# Patient Record
Sex: Female | Born: 1985 | Race: Black or African American | Hispanic: No | Marital: Single | Smoking: Current every day smoker
Health system: Southern US, Community
[De-identification: ages and names within clinical notes are randomized; demographics above are authoritative.]

## PROBLEM LIST (undated history)

## (undated) DIAGNOSIS — F319 Bipolar disorder, unspecified: Secondary | ICD-10-CM

## (undated) DIAGNOSIS — E119 Type 2 diabetes mellitus without complications: Secondary | ICD-10-CM

## (undated) HISTORY — DX: Type 2 diabetes mellitus without complications: E11.9

---

## 2005-07-03 ENCOUNTER — Emergency Department: Payer: Self-pay | Admitting: Emergency Medicine

## 2008-01-19 ENCOUNTER — Emergency Department: Payer: Self-pay | Admitting: Emergency Medicine

## 2008-01-20 ENCOUNTER — Ambulatory Visit: Payer: Self-pay | Admitting: Emergency Medicine

## 2015-09-05 ENCOUNTER — Other Ambulatory Visit: Payer: Self-pay | Admitting: Internal Medicine

## 2015-09-05 DIAGNOSIS — N63 Unspecified lump in unspecified breast: Secondary | ICD-10-CM

## 2015-09-15 ENCOUNTER — Other Ambulatory Visit: Payer: Self-pay | Admitting: Internal Medicine

## 2015-09-15 DIAGNOSIS — N6325 Unspecified lump in the left breast, overlapping quadrants: Secondary | ICD-10-CM

## 2015-09-15 DIAGNOSIS — N6320 Unspecified lump in the left breast, unspecified quadrant: Principal | ICD-10-CM

## 2015-09-21 ENCOUNTER — Other Ambulatory Visit: Payer: Self-pay

## 2015-10-05 ENCOUNTER — Other Ambulatory Visit: Payer: Self-pay

## 2015-10-07 ENCOUNTER — Telehealth: Payer: Self-pay

## 2015-10-07 NOTE — Telephone Encounter (Signed)
CALLED PATIENT TO MAKE APPT - REFERRED FROM [facility]

## 2015-10-11 ENCOUNTER — Telehealth: Payer: Self-pay

## 2015-10-11 NOTE — Telephone Encounter (Signed)
tried to call pateint to sch new patient appt, left msg

## 2015-10-17 ENCOUNTER — Other Ambulatory Visit: Payer: Self-pay

## 2015-10-24 ENCOUNTER — Other Ambulatory Visit: Payer: Self-pay | Admitting: Internal Medicine

## 2015-10-24 ENCOUNTER — Ambulatory Visit
Admission: RE | Admit: 2015-10-24 | Discharge: 2015-10-24 | Disposition: A | Discharge status: Home / Self Care | Payer: BLUE CROSS/BLUE SHIELD | Source: Ambulatory Visit | Attending: Internal Medicine | Admitting: Internal Medicine

## 2015-10-24 DIAGNOSIS — N6320 Unspecified lump in the left breast, unspecified quadrant: Principal | ICD-10-CM

## 2015-10-24 DIAGNOSIS — N6325 Unspecified lump in the left breast, overlapping quadrants: Secondary | ICD-10-CM

## 2015-10-27 ENCOUNTER — Other Ambulatory Visit: Payer: Self-pay | Admitting: Internal Medicine

## 2015-10-27 DIAGNOSIS — N6320 Unspecified lump in the left breast, unspecified quadrant: Principal | ICD-10-CM

## 2015-10-27 DIAGNOSIS — N6325 Unspecified lump in the left breast, overlapping quadrants: Secondary | ICD-10-CM

## 2015-10-28 ENCOUNTER — Other Ambulatory Visit: Payer: Self-pay | Admitting: Internal Medicine

## 2015-10-28 ENCOUNTER — Ambulatory Visit
Admission: RE | Admit: 2015-10-28 | Discharge: 2015-10-28 | Disposition: A | Discharge status: Home / Self Care | Payer: BLUE CROSS/BLUE SHIELD | Source: Ambulatory Visit | Attending: Internal Medicine | Admitting: Internal Medicine

## 2015-10-28 DIAGNOSIS — N6320 Unspecified lump in the left breast, unspecified quadrant: Principal | ICD-10-CM

## 2015-10-28 DIAGNOSIS — N6325 Unspecified lump in the left breast, overlapping quadrants: Secondary | ICD-10-CM

## 2015-11-10 ENCOUNTER — Ambulatory Visit: Payer: Self-pay | Admitting: Certified Nurse Midwife

## 2018-02-11 ENCOUNTER — Emergency Department: Payer: No Typology Code available for payment source

## 2018-02-11 ENCOUNTER — Emergency Department
Admission: EM | Admit: 2018-02-11 | Discharge: 2018-02-11 | Disposition: A | Discharge status: Other Acute Inpatient Hospital | Payer: No Typology Code available for payment source | Attending: Emergency Medicine | Admitting: Emergency Medicine

## 2018-02-11 ENCOUNTER — Encounter: Payer: Self-pay | Admitting: Emergency Medicine

## 2018-02-11 ENCOUNTER — Other Ambulatory Visit: Payer: Self-pay

## 2018-02-11 DIAGNOSIS — F312 Bipolar disorder, current episode manic severe with psychotic features: Secondary | ICD-10-CM | POA: Insufficient documentation

## 2018-02-11 DIAGNOSIS — F250 Schizoaffective disorder, bipolar type: Secondary | ICD-10-CM

## 2018-02-11 DIAGNOSIS — F17200 Nicotine dependence, unspecified, uncomplicated: Secondary | ICD-10-CM | POA: Insufficient documentation

## 2018-02-11 DIAGNOSIS — F29 Unspecified psychosis not due to a substance or known physiological condition: Secondary | ICD-10-CM | POA: Insufficient documentation

## 2018-02-11 DIAGNOSIS — R456 Violent behavior: Secondary | ICD-10-CM | POA: Insufficient documentation

## 2018-02-11 DIAGNOSIS — R451 Restlessness and agitation: Secondary | ICD-10-CM | POA: Insufficient documentation

## 2018-02-11 LAB — URINALYSIS, COMPLETE (UACMP) WITH MICROSCOPIC
Bacteria, UA: NONE SEEN
Bilirubin Urine: NEGATIVE
Glucose, UA: NEGATIVE mg/dL
Ketones, ur: NEGATIVE mg/dL
LEUKOCYTES UA: NEGATIVE
Nitrite: NEGATIVE
PH: 6 (ref 5.0–8.0)
Protein, ur: 100 mg/dL — AB
RBC / HPF: >50 RBC/hpf — ABNORMAL HIGH (ref 0–5)
SPECIFIC GRAVITY, URINE: 1.027 (ref 1.005–1.030)
WBC, UA: >50 WBC/hpf — ABNORMAL HIGH (ref 0–5)

## 2018-02-11 LAB — COMPREHENSIVE METABOLIC PANEL
ALK PHOS: 56 U/L (ref 38–126)
ALT: 13 U/L (ref 0–44)
AST: 28 U/L (ref 15–41)
Albumin: 3.8 g/dL (ref 3.5–5.0)
Anion gap: 7 (ref 5–15)
BUN: 18 mg/dL (ref 6–20)
CALCIUM: 9.3 mg/dL (ref 8.9–10.3)
CHLORIDE: 108 mmol/L (ref 98–111)
CO2: 25 mmol/L (ref 22–32)
CREATININE: 0.64 mg/dL (ref 0.44–1.00)
GFR calc non Af Amer: >60 mL/min (ref >60)
GLUCOSE: 109 mg/dL — AB (ref 70–99)
Potassium: 3.5 mmol/L (ref 3.5–5.1)
SODIUM: 140 mmol/L (ref 135–145)
Total Bilirubin: 0.4 mg/dL (ref 0.3–1.2)
Total Protein: 7.9 g/dL (ref 6.5–8.1)

## 2018-02-11 LAB — VITAMIN B12: Vitamin B-12: 322 pg/mL (ref 180–914)

## 2018-02-11 LAB — CBC
HEMATOCRIT: 34.3 % — AB (ref 35.0–47.0)
Hemoglobin: 11.4 g/dL — ABNORMAL LOW (ref 12.0–16.0)
MCH: 27.3 pg (ref 26.0–34.0)
MCHC: 33.2 g/dL (ref 32.0–36.0)
MCV: 82.4 fL (ref 80.0–100.0)
PLATELETS: 349 10*3/uL (ref 150–440)
RBC: 4.16 MIL/uL (ref 3.80–5.20)
RDW: 13.5 % (ref 11.5–14.5)
WBC: 6.9 10*3/uL (ref 3.6–11.0)

## 2018-02-11 LAB — POCT PREGNANCY, URINE: PREG TEST UR: NEGATIVE

## 2018-02-11 LAB — DIFFERENTIAL
BASOS ABS: 0.1 10*3/uL (ref 0–0.1)
BASOS PCT: 1 %
EOS ABS: 0.1 10*3/uL (ref 0–0.7)
EOS PCT: 1 %
Lymphocytes Relative: 40 %
Lymphs Abs: 2.8 10*3/uL (ref 1.0–3.6)
MONOS PCT: 9 %
Monocytes Absolute: 0.6 10*3/uL (ref 0.2–0.9)
Neutro Abs: 3.4 10*3/uL (ref 1.4–6.5)
Neutrophils Relative %: 49 %

## 2018-02-11 LAB — URINE DRUG SCREEN, QUALITATIVE (ARMC ONLY)
AMPHETAMINES, UR SCREEN: NOT DETECTED
BENZODIAZEPINE, UR SCRN: NOT DETECTED
CANNABINOID 50 NG, UR ~~LOC~~: POSITIVE — AB
Cocaine Metabolite,Ur ~~LOC~~: NOT DETECTED
MDMA (Ecstasy)Ur Screen: NOT DETECTED
Methadone Scn, Ur: NOT DETECTED
OPIATE, UR SCREEN: NOT DETECTED
PHENCYCLIDINE (PCP) UR S: NOT DETECTED
Tricyclic, Ur Screen: NOT DETECTED

## 2018-02-11 LAB — TSH: TSH: 2.164 u[IU]/mL (ref 0.350–4.500)

## 2018-02-11 LAB — ETHANOL: Alcohol, Ethyl (B): <10 mg/dL (ref <10)

## 2018-02-11 MED ORDER — LORAZEPAM 2 MG/ML IJ SOLN
2.0000 mg | Freq: Four times a day (QID) | INTRAMUSCULAR | Status: DC | PRN
Start: 2018-02-11 — End: 2018-02-12

## 2018-02-11 MED ORDER — CEPHALEXIN 500 MG PO CAPS
500.0000 mg | ORAL_CAPSULE | Freq: Three times a day (TID) | ORAL | Status: DC
  Administered 2018-02-11 (×2): 500 mg via ORAL
  Filled 2018-02-11 (×4): qty 1

## 2018-02-11 MED ORDER — LORAZEPAM 2 MG PO TABS
2.0000 mg | ORAL_TABLET | Freq: Four times a day (QID) | ORAL | Status: DC | PRN
  Administered 2018-02-11: 2 mg via ORAL
  Filled 2018-02-11: qty 1

## 2018-02-11 NOTE — ED Notes (Addendum)
Spoke with Jomarie LongsJoseph in the lab concerning the add on labs   - he states that he has all the specimens he will need

## 2018-02-11 NOTE — ED Triage Notes (Signed)
[facility] [place] [facility] with [place]. IVC papers reports that she is talking to people that are not there. In triage she is doing the same. Papers also states that she is yelling at family members and is not taking her medication. Family has tried to get her help and she is refusing. They are concerned for her safety. Denies SI/HI at this time.

## 2018-02-11 NOTE — Consult Note (Signed)
[facility] Consult   Reason for Consult: Consult for 32 year old woman brought to the hospital under paperwork filed by her family Referring Physician: Rip Harbour Patient Identification: Jenna Bell MRN:  [id number] Principal Diagnosis: Bipolar affective disorder, current episode manic with psychotic symptoms ([place]) Diagnosis:   Patient Active Problem List   Diagnosis Date Noted  . Bipolar affective disorder, current episode manic with psychotic symptoms ([place]) [F31.2] 02/11/2018    Total Time spent with patient: 1 hour  Subjective:   Jenna Bell is a 32 y.o. female patient admitted with "I am pregnant!  This ditzy mother!".  HPI: Patient seen chart reviewed.  Commitment paperwork details that the patient has been increasingly bizarre and agitated in her behavior recently.  It is reported that she talks to people who are not there including standing out in front of the house yelling at people who were not there.  Frightening family at night.  Not making sense.  Not eating and not taking care of her health.  Patient is very difficult to interview.  Throughout the interview she frequently shouts "I am pregnant!".  She answers questions but all very superficially usually with just one word.  She denies feeling she has any symptoms.  Claims she sleeps well.  Denies hallucinations.  Blames her whole situation on her mother and uses a lot of angry language towards her family.  Denies substance abuse.  Patient says she lives over in the [place] area and was just briefly here in town visiting her family.  Denies any psychiatric treatment.  Medical history: Paperwork reports that the patient has been losing a great deal of weight and her hair has been falling out.  No known medical problems  Social history: Patient says that she lives in [place] by herself.  She claims that she works regularly.  Petition filed by family.  Other details not available.  Substance abuse history:  Patient denies any.  Urine drug screen is not back yet  Past Psychiatric History: Patient denies ever seeing a psychiatrist or mental health provider in the past.  There is no record anywhere in the computer of previous psychiatric evaluation or concern.  The commitment paper claims that the patient has sought treatment in the past but never followed up with it.  Evidently no history of suicide attempts or hospitalization or psychiatric medicine  Risk to Self:   Risk to Others:   Prior Inpatient Therapy:   Prior Outpatient Therapy:    Past Medical History: History reviewed. No pertinent past medical history. History reviewed. No pertinent surgical history. Family History: History reviewed. No pertinent family history. Family Psychiatric  History: Unknown Social History:  Social History   Substance and Sexual Activity  Alcohol Use Never  . Frequency: Never     Social History   Substance and Sexual Activity  Drug Use Not on file    Social History   Socioeconomic History  . Marital status: Single    Spouse name: Not on file  . Number of children: Not on file  . Years of education: Not on file  . Highest education level: Not on file  Occupational History  . Not on file  Social Needs  . Financial resource strain: Not on file  . Food insecurity:    Worry: Not on file    Inability: Not on file  . Transportation needs:    Medical: Not on file    Non-medical: Not on file  Tobacco Use  . Smoking status:  Light Tobacco Smoker  Substance and Sexual Activity  . Alcohol use: Never    Frequency: Never  . Drug use: Not on file  . Sexual activity: Yes  Lifestyle  . Physical activity:    Days per week: Not on file    Minutes per session: Not on file  . Stress: Not on file  Relationships  . Social connections:    Talks on phone: Not on file    Gets together: Not on file    Attends religious service: Not on file    Active member of club or organization: Not on file    Attends  meetings of clubs or organizations: Not on file    Relationship status: Not on file  Other Topics Concern  . Not on file  Social History Narrative  . Not on file   Additional Social History:    Allergies:  No Known Allergies  Labs:  Results for orders placed or performed during the hospital encounter of 02/11/18 (from the past 48 hour(s))  Comprehensive metabolic panel     Status: Abnormal   Collection Time: 02/11/18  1:16 PM  Result Value Ref Range   Sodium 140 135 - 145 mmol/L   Potassium 3.5 3.5 - 5.1 mmol/L   Chloride 108 98 - 111 mmol/L    Comment: Please note change in reference range.   CO2 25 22 - 32 mmol/L   Glucose, Bld 109 (H) 70 - 99 mg/dL    Comment: Please note change in reference range.   BUN 18 6 - 20 mg/dL    Comment: Please note change in reference range.   Creatinine, Ser 0.64 0.44 - 1.00 mg/dL   Calcium 9.3 8.9 - 10.3 mg/dL   Total Protein 7.9 6.5 - 8.1 g/dL   Albumin 3.8 3.5 - 5.0 g/dL   AST 28 15 - 41 U/L   ALT 13 0 - 44 U/L    Comment: Please note change in reference range.   Alkaline Phosphatase 56 38 - 126 U/L   Total Bilirubin 0.4 0.3 - 1.2 mg/dL   GFR calc non Af Amer >60 >60 mL/min   GFR calc Af Amer >60 >60 mL/min    Comment: (NOTE) The eGFR has been calculated using the CKD EPI equation. This calculation has not been validated in all clinical situations. eGFR's persistently <60 mL/min signify possible Chronic Kidney Disease.    Anion gap 7 5 - 15    Comment: Performed at [facility], [address] [place], [place] [address]  Ethanol     Status: None   Collection Time: 02/11/18  1:16 PM  Result Value Ref Range   Alcohol, Ethyl (B) <10 <10 mg/dL    Comment: (NOTE) Lowest detectable limit for serum alcohol is 10 mg/dL. For medical purposes only. Performed at [facility], [address], [place] [address]   cbc     Status: Abnormal   Collection Time: 02/11/18  1:16 PM  Result Value Ref Range   WBC 6.9  3.6 - 11.0 K/uL   RBC 4.16 3.80 - 5.20 MIL/uL   Hemoglobin 11.4 (L) 12.0 - 16.0 g/dL   HCT 34.3 (L) 35.0 - 47.0 %   MCV 82.4 80.0 - 100.0 fL   MCH 27.3 26.0 - 34.0 pg   MCHC 33.2 32.0 - 36.0 g/dL   RDW 13.5 11.5 - 14.5 %   Platelets 349 150 - 440 K/uL    Comment: Performed at [facility],  [place], [place] [address]  TSH     Status: None   Collection Time: 02/11/18  1:16 PM  Result Value Ref Range   TSH 2.164 0.350 - 4.500 uIU/mL    Comment: Performed by a 3rd Generation assay with a functional sensitivity of <=0.01 uIU/mL. Performed at [facility], [address] [place], [place] [address]   Differential     Status: None   Collection Time: 02/11/18  1:16 PM  Result Value Ref Range   Neutrophils Relative % 49 %   Neutro Abs 3.4 1.4 - 6.5 K/uL   Lymphocytes Relative 40 %   Lymphs Abs 2.8 1.0 - 3.6 K/uL   Monocytes Relative 9 %   Monocytes Absolute 0.6 0.2 - 0.9 K/uL   Eosinophils Relative 1 %   Eosinophils Absolute 0.1 0 - 0.7 K/uL   Basophils Relative 1 %   Basophils Absolute 0.1 0 - 0.1 K/uL    Comment: Performed at [facility], [address] [place], [place] [address]  Urinalysis, Complete w Microscopic     Status: Abnormal   Collection Time: 02/11/18  1:17 PM  Result Value Ref Range   Color, Urine RED (A) YELLOW    Comment: BIOCHEMICALS MAY BE AFFECTED BY COLOR   APPearance CLOUDY (A) CLEAR   Specific Gravity, Urine 1.027 1.005 - 1.030   pH 6.0 5.0 - 8.0   Glucose, UA NEGATIVE NEGATIVE mg/dL   Hgb urine dipstick LARGE (A) NEGATIVE   Bilirubin Urine NEGATIVE NEGATIVE   Ketones, ur NEGATIVE NEGATIVE mg/dL   Protein, ur 100 (A) NEGATIVE mg/dL   Nitrite NEGATIVE NEGATIVE   Leukocytes, UA NEGATIVE NEGATIVE   RBC / HPF >50 (H) 0 - 5 RBC/hpf   WBC, UA >50 (H) 0 - 5 WBC/hpf   Bacteria, UA NONE SEEN NONE SEEN   Squamous Epithelial / LPF >50 (H) 0 - 5   Mucus PRESENT     Comment: Performed at [facility], [address] [place], [place] [address]  Pregnancy, urine POC     Status: None   Collection Time: 02/11/18  2:13 PM  Result Value Ref Range   Preg Test, Ur NEGATIVE NEGATIVE    Comment:        THE SENSITIVITY OF THIS METHODOLOGY IS >24 mIU/mL     Current Facility-Administered Medications  Medication Dose Route Frequency Provider Last Rate Last Dose  . cephALEXin (KEFLEX) capsule 500 mg  500 mg Oral Q8H Nena Polio, MD   500 mg at 02/11/18 1526   No current outpatient medications on file.    Musculoskeletal: Strength & Muscle Tone: within normal limits Gait & Station: normal Patient leans: N/A  Psychiatric Specialty Exam: Physical Exam  Nursing note and vitals reviewed. Constitutional: She appears well-developed and well-nourished.  HENT:  Head: Normocephalic and atraumatic.  Eyes: Pupils are equal, round, and reactive to light. Conjunctivae are normal.  Neck: Normal range of motion.  Cardiovascular: Regular rhythm and normal heart sounds.  Respiratory: Effort normal. No respiratory distress.  GI: Soft.  Musculoskeletal: Normal range of motion.  Neurological: She is alert.  Skin: Skin is warm and dry.  Psychiatric: Her affect is labile and inappropriate. Her speech is rapid and/or pressured and tangential. She is agitated and hyperactive. She is not aggressive. Thought content is paranoid and delusional. Cognition and memory are impaired. She expresses impulsivity and inappropriate judgment. She expresses no homicidal and no suicidal ideation.    Review  of Systems  Constitutional: Negative.   HENT: Negative.   Eyes: Negative.   Respiratory: Negative.   Cardiovascular: Negative.   Gastrointestinal: Negative.   Musculoskeletal: Negative.   Skin: Negative.   Neurological: Negative.   Psychiatric/Behavioral: Negative.     Blood pressure 134/90, pulse (!) 105, temperature 97.8 F (36.6 C), temperature source Oral, height '5\' 2"'  (1.575 m), weight 54.4 kg (120 lb), last  menstrual period 02/10/2018, SpO2 99 %.Body mass index is 21.95 kg/m.  General Appearance: Disheveled  Eye Contact:  Minimal  Speech:  Garbled and Pressured  Volume:  Increased  Mood:  Irritable  Affect:  Inappropriate and Labile  Thought Process:  Disorganized  Orientation:  NA  Thought Content:  Illogical, Paranoid Ideation and Tangential  Suicidal Thoughts:  No  Homicidal Thoughts:  No  Memory:  Immediate;   Fair Recent;   Poor Remote;   Poor  Judgement:  Impaired  Insight:  Lacking  Psychomotor Activity:  Increased  Concentration:  Concentration: Poor  Recall:  Poor  Fund of Knowledge:  Fair  Language:  Fair  Akathisia:  No  Handed:  Right  AIMS (if indicated):     Assets:  Housing Social Support  ADL's:  Impaired  Cognition:  Impaired,  Mild  Sleep:        Treatment Plan Summary: Daily contact with patient to assess and evaluate symptoms and progress in treatment, Medication management and Plan 32 year old woman who presents in under involuntary commitment papers that describe a lot of bizarre agitated behavior.  Patient is disheveled and sort of wild looking.  Her behavior in the interview is inappropriate and bizarre.  Very labile affect.  Very and appropriate answers to questions.  Patient when she is not shouting at me is talking to herself nonstop.  Frequently turns away and then whispers very rapidly.  When I inquired who she was talking to she got very defensive and denied that she was talking to anyone in particular but went straight back to it.  Differential diagnosis here would include substance-induced psychosis and new onset psychotic disorder such as bipolar manic disorder or schizophrenia.  The rest of the medical workup so far is fairly unremarkable.  Head CT is normal.  Her urine analysis as a significant amount of blood in it as well as white cells.  Not clear if it is infected or not.  Patient will remain under commitment and we will work on admission to the  psychiatric ward.  Disposition: Recommend psychiatric Inpatient admission when medically cleared. Supportive therapy provided about ongoing stressors.  Alethia Berthold, MD 02/11/2018 4:08 PM

## 2018-02-11 NOTE — BH Assessment (Signed)
Patient is to be admitted to [facility] BMU by Dr. Toni Amendlapacs.  Attending Physician will be Dr. Karie SodaPucilowksa.   Patient has been [facility] to room 313[facility] Charge Nurse Lillette BoxerGwen F.   ER staff is aware of the admission:  Glenda, ER Sectary   Dr. Alphonzo LemmingsMcShane, ER MD   Vonna KotykJay, Patient Access.

## 2018-02-11 NOTE — ED Notes (Addendum)
Patient assigned to appropriate care area   Introduced self to pt  Patient oriented to unit/care area: Informed that, for their safety, care areas are designed for safety and visiting and phone hours explained to patient. Patient verbalizes understanding, and verbal contract for safety obtained  Environment secured   Patient is on the unit and appears to responding to internal stimuli.  Patient is talking to people that are not there. Denies SI/HI at this time. Patient is stating I want to go home, she spoke with her mother. Patient said she will take the Ativan 2mg .

## 2018-02-11 NOTE — ED Notes (Signed)

## 2018-02-11 NOTE — ED Notes (Signed)
Lunch provided   BEHAVIORAL HEALTH ROUNDING Patient sleeping: No. Patient alert and oriented: yes Behavior appropriate: Yes.  ; If no, describe:  Nutrition and fluids offered: yes Toileting and hygiene offered: Yes  Sitter present: q15 minute observations and security monitoring Law enforcement present: Yes

## 2018-02-11 NOTE — ED Provider Notes (Signed)
[facility] Emergency Department Provider Note   ____________________________________________   First MD Initiated Contact with Patient 02/11/18 1334     (approximate)  I have reviewed the triage vital signs and the nursing notes.   HISTORY  Chief Complaint Psychiatric Evaluation    HPI Jenna Bell is a 32 y.o. female who comes in under commitment.  Family reports she has no known mental illness she is not on any medication for mental illness but she has been having some problems.  Family reports she is attempted to get her help but she always refuses.  She is reported to be verbally and physically aggressive and yelled at people who were not there carries on conversations with people who were not there she is not eating family reports her hair is falling out.  She is acting strangely staring at people and then walking away in the emergency room patient says she is feeling okay nothing is bothering her but she begins talking to herself while I am listening to her heart and lungs seems as though she is talking to someone who is not there.  Patient denies any fever chills nausea vomiting diarrhea chest pain belly pain or any other problems.   History reviewed. No pertinent past medical history.  There are no active problems to display for this patient.   History reviewed. No pertinent surgical history.  Prior to Admission medications   Not on File    Allergies Patient has no known allergies.  History reviewed. No pertinent family history.  Social History Social History   Tobacco Use  . Smoking status: Light Tobacco Smoker  Substance Use Topics  . Alcohol use: Never    Frequency: Never  . Drug use: Not on file    Review of Systems  Constitutional: No fever/chills Eyes: No visual changes. ENT: No sore throat. Cardiovascular: Denies chest pain. Respiratory: Denies shortness of breath. Gastrointestinal: No abdominal pain.  No nausea, no  vomiting.  No diarrhea.  No constipation. Genitourinary: Negative for dysuria. Musculoskeletal: Negative for back pain. Skin: Negative for rash. Neurological: Negative for headaches, focal weakness   ____________________________________________   PHYSICAL EXAM:  VITAL SIGNS: ED Triage Vitals  Enc Vitals Group     BP 02/11/18 1312 134/90     Pulse Rate 02/11/18 1312 (!) 105     Resp --      Temp 02/11/18 1312 97.8 F (36.6 C)     Temp Source 02/11/18 1312 Oral     SpO2 02/11/18 1312 99 %     Weight 02/11/18 1313 120 lb (54.4 kg)     Height 02/11/18 1313 5\' 2"  (1.575 m)     Head Circumference --      Peak Flow --      Pain Score 02/11/18 1313 0     Pain Loc --      Pain Edu? --      Excl. in GC? --     Constitutional: Alert and oriented. Well appearing and in no acute distress but acting unusually as I did detailed above. Eyes: Conjunctivae are normal. . Head: Atraumatic. Nose: No congestion/rhinnorhea. Mouth/Throat: Mucous membranes are moist.  Oropharynx non-erythematous. Neck: No stridor.   Hematological/Lymphatic/Immunilogical: No cervical lymphadenopathy. Cardiovascular: Normal rate, regular rhythm. Grossly normal heart sounds.  Good peripheral circulation. Respiratory: Normal respiratory effort.  No retractions. Lungs CTAB. Gastrointestinal: Soft and nontender. No distention. No abdominal bruits. No CVA tenderness. }Musculoskeletal: No lower extremity tenderness nor edema.  No joint  effusions. Neurologic:  Normal speech and language. No gross focal neurologic deficits are appreciated. Skin:  Skin is warm, dry and intact. No rash noted.   ____________________________________________   LABS (all labs ordered are listed, but only abnormal results are displayed)  Labs Reviewed  COMPREHENSIVE METABOLIC PANEL - Abnormal; Notable for the following components:      Result Value   Glucose, Bld 109 (*)    All other components within normal limits  CBC - Abnormal;  Notable for the following components:   Hemoglobin 11.4 (*)    HCT 34.3 (*)    All other components within normal limits  URINALYSIS, COMPLETE (UACMP) WITH MICROSCOPIC - Abnormal; Notable for the following components:   Color, Urine RED (*)    APPearance CLOUDY (*)    Hgb urine dipstick LARGE (*)    Protein, ur 100 (*)    RBC / HPF >50 (*)    WBC, UA >50 (*)    Squamous Epithelial / LPF >50 (*)    All other components within normal limits  URINE CULTURE  ETHANOL  TSH  DIFFERENTIAL  URINE DRUG SCREEN, QUALITATIVE ([facility] ONLY)  VITAMIN B12  RPR  FOLATE RBC  SALICYLATE LEVEL  ACETAMINOPHEN LEVEL  POC URINE PREG, ED  POCT PREGNANCY, URINE   ____________________________________________  EKG   ____________________________________________  RADIOLOGY  ED MD interpretation: Chest x-ray and head CT read by radiology are negative.  I reviewed the chest x-ray.  Official radiology report(s): Ct Head Wo Contrast  Result Date: 02/11/2018 CLINICAL DATA:  Altered mental status. EXAM: CT HEAD WITHOUT CONTRAST TECHNIQUE: Contiguous axial images were obtained from the base of the skull through the vertex without intravenous contrast. COMPARISON:  None. FINDINGS: Brain: No evidence of acute infarction, hemorrhage, hydrocephalus, extra-axial collection or mass lesion/mass effect. Vascular: No hyperdense vessel or unexpected calcification. Skull: Intact.  No focal lesion. Sinuses/Orbits: Negative. Other: None. IMPRESSION: Normal head CT. Electronically Signed   By: Drusilla Kannerhomas  Dalessio M.D.   On: 02/11/2018 14:30   Dg Chest Portable 1 View  Result Date: 02/11/2018 CLINICAL DATA:  Altered mental status EXAM: PORTABLE CHEST 1 VIEW COMPARISON:  None. FINDINGS: The heart size and mediastinal contours are within normal limits. Both lungs are clear. The visualized skeletal structures are unremarkable. IMPRESSION: No active disease. Electronically Signed   By: Tollie Ethavid  Kwon M.D.   On: 02/11/2018 13:50     ____________________________________________   PROCEDURES  Procedure(s) performed:   Procedures  Critical Care performed:   ____________________________________________   INITIAL IMPRESSION / ASSESSMENT AND PLAN / ED COURSE  Patient denies any abdominal pain.  She does says she is on her menstrual period.  That does not explain the white blood cells in her urine.  I will give her some antibiotics for that otherwise she is essentially medically cleared we will get the RBC folate etc. back shortly.     ____________________________________________   FINAL CLINICAL IMPRESSION(S) / ED DIAGNOSES  Final diagnoses:  Psychosis, unspecified psychosis type [facility](HCC)     ED Discharge Orders    None       Note:  This document was prepared using Dragon voice recognition software and may include unintentional dictation errors.    Arnaldo NatalMalinda, Milferd Ansell F, MD 02/11/18 [id number]

## 2018-02-11 NOTE — ED Notes (Signed)
Pt to CT scan with BPD and Gerilyn PilgrimJacob

## 2018-02-11 NOTE — BH Assessment (Signed)
Assessment Note  Jenna Bell is an 32 y.o. female who presents to the ER due to family having concerns about the recent change in her behaviors. Per report giving to ER staff, the patient is having behaviors of someone who is manic. Decrease of sleep, erratic and aggressive behaviors.   Patient was unable to participate in interview due to being agitated. She continued to state their wasn't anything wrong with her but she was only pregnant, which she isn't. Prior to Clinical research associatewriter interacting with the patient, he witnessed her responding to internal stimuli. When patient saw Clinical research associatewriter, she stop talking and requested to go home because "nothing ain't wrong with me." Patient denies SI/HI and AV/H. Per records and report of patient's family, she there have been no past mental health concerns.   Patient UDS indicate she uses cannabis. Per [place] [address] [facility] website, the patient do not have any upcoming courts dates. It's unclear what her past involvement with legal system have been.  Diagnosis: Bipolar  Past Medical History: History reviewed. No pertinent past medical history.  History reviewed. No pertinent surgical history.  Family History: History reviewed. No pertinent family history.  Social History:  reports that she has been smoking.  She does not have any smokeless tobacco history on file. She reports that she does not drink alcohol. Her drug history is not on file.  Additional Social History:  Alcohol / Drug Use Pain Medications: See PTA Prescriptions: See PTA Over the Counter: See PTA History of alcohol / drug use?: Yes Longest period of sobriety (when/how long): Unable to assess Negative Consequences of Use: (Unable to assess) Substance #1 Name of Substance 1: Cannabis  CIWA: CIWA-Ar BP: 134/90 Pulse Rate: (!) 105 COWS:    Allergies: No Known Allergies  Home Medications:  (Not in a hospital admission)  OB/GYN Status:  Patient's last menstrual period was 02/10/2018.  General  Assessment Data [facility] Assessment: In system Is this a Tele or Face-to-Face Assessment?: Face-to-Face Is this an Initial Assessment or a Re-assessment for this encounter?: Initial Assessment Marital status: Single Maiden name: n/a Is patient pregnant?: No Pregnancy Status: No Living Arrangements: Other (Comment) Can pt return to current living arrangement?: Yes Admission Status: Involuntary Is patient capable of signing voluntary admission?: No(Under IVC) Referral Source: Self/Family/Friend Insurance type: None  Medical Screening Exam [facility] Walk-in ONLY) Medical Exam completed: Yes  Crisis Care Plan Living Arrangements: Other (Comment) Legal Guardian: Other:(Self) Name of Psychiatrist: Reports of none Name of Therapist: Reports of none  Education Status Is patient currently in school?: No  Risk to self with the past 6 months Suicidal Ideation: No Has patient been a risk to self within the past 6 months prior to admission? : No Suicidal Intent: No Has patient had any suicidal intent within the past 6 months prior to admission? : No Is patient at risk for suicide?: No Suicidal Plan?: No Has patient had any suicidal plan within the past 6 months prior to admission? : No Access to Means: No What has been your use of drugs/alcohol within the last 12 months?: Cannabis Previous Attempts/Gestures: No How many times?: 0 Other Self Harm Risks: Reports of none Triggers for Past Attempts: None known Intentional Self Injurious Behavior: None Family Suicide History: Unknown Recent stressful life event(s): Other (Comment)(Recent onset of mania) Persecutory voices/beliefs?: No Depression: No Depression Symptoms: Feeling angry/irritable Substance abuse history and/or treatment for substance abuse?: Yes Suicide prevention information given to non-admitted patients: Not applicable  Risk  to Others within the past 6 months Homicidal Ideation: No Does  patient have any lifetime risk of violence toward others beyond the six months prior to admission? : Yes (comment) Thoughts of Harm to Others: No Current Homicidal Intent: No Current Homicidal Plan: No Access to Homicidal Means: No Identified Victim: Reports of none History of harm to others?: Yes Assessment of Violence: In past 6-12 months(Per IVC and report giving by family) Violent Behavior Description: Family reports patient violent towards them. Does patient have access to weapons?: No Criminal Charges Pending?: No Does patient have a court date: No Is patient on probation?: No  Psychosis Hallucinations: Auditory Delusions: Unspecified  Mental Status Report Appearance/Hygiene: Poor hygiene, In scrubs Eye Contact: Fair Motor Activity: Freedom of movement, Unremarkable Speech: Argumentative, Loud Level of Consciousness: Alert, Restless Mood: Anxious, Suspicious, Irritable Affect: Angry, Anxious, Sad Anxiety Level: Minimal Thought Processes: Coherent, Irrelevant Judgement: Partial Orientation: Person, Place, Time, Situation, Appropriate for developmental age Obsessive Compulsive Thoughts/Behaviors: Moderate  Cognitive Functioning Concentration: Decreased Memory: Recent Intact, Remote Intact Is patient IDD: No Is patient DD?: No Insight: Poor Impulse Control: Poor Appetite: Fair(Unable to assess) Have you had any weight changes? : (Unable to assess) Sleep: Unable to Assess Vegetative Symptoms: None  ADLScreening [facility] Assessment Services) Patient's cognitive ability adequate to safely complete daily activities?: Yes Patient able to express need for assistance with ADLs?: Yes Independently performs ADLs?: Yes (appropriate for developmental age)  Prior Inpatient Therapy Prior Inpatient Therapy: No  Prior Outpatient Therapy Prior Outpatient Therapy: No Does patient have an ACCT team?: No Does patient have Intensive In-House Services?  : No Does patient have  [facility] services? : No Does patient have P4CC services?: No  ADL Screening (condition at time of admission) Patient's cognitive ability adequate to safely complete daily activities?: Yes Is the patient deaf or have difficulty hearing?: No Does the patient have difficulty seeing, even when wearing glasses/contacts?: No Does the patient have difficulty concentrating, remembering, or making decisions?: No Patient able to express need for assistance with ADLs?: Yes Does the patient have difficulty dressing or bathing?: No Independently performs ADLs?: Yes (appropriate for developmental age) Does the patient have difficulty walking or climbing stairs?: No Weakness of Legs: None Weakness of Arms/Hands: None  Home Assistive Devices/Equipment Home Assistive Devices/Equipment: None  Therapy Consults (therapy consults require a physician order) PT Evaluation Needed: No OT Evalulation Needed: No SLP Evaluation Needed: No Abuse/Neglect Assessment (Assessment to be complete while patient is alone) Abuse/Neglect Assessment Can Be Completed: Unable to assess, patient is non-responsive or altered mental status Values / Beliefs Cultural Requests During Hospitalization: None Spiritual Requests During Hospitalization: None Consults Spiritual Care Consult Needed: No Social Work Consult Needed: No         Child/Adolescent Assessment Running Away Risk: Denies(Patient is an adult)  Disposition:  Disposition Initial Assessment Completed for this Encounter: Yes  On Site Evaluation by:   Reviewed with Physician:    Lilyan Gilford MS, LCAS, LPC, NCC, CCSI Therapeutic Triage Specialist 02/11/2018 6:28 PM

## 2018-02-11 NOTE — ED Notes (Signed)

## 2018-02-11 NOTE — ED Notes (Signed)
Patient received PM snack. 

## 2018-02-11 NOTE — ED Notes (Signed)
BEHAVIORAL HEALTH ROUNDING Patient sleeping: No. Patient alert and oriented: yes Behavior appropriate: Yes.  ; If no, describe:  Nutrition and fluids offered: yes Toileting and hygiene offered: Yes  Sitter present: q15 minute observations and security monitoring Law enforcement present: Yes    

## 2018-02-11 NOTE — ED Notes (Signed)
IVC/ Consult completed/ Plan to admit to BMU  

## 2018-02-12 ENCOUNTER — Inpatient Hospital Stay
Admission: AD | Admit: 2018-02-12 | Discharge: 2018-03-07 | DRG: 885 | Disposition: A | Discharge status: Home / Self Care | Payer: No Typology Code available for payment source | Attending: Psychiatry | Admitting: Psychiatry

## 2018-02-12 ENCOUNTER — Encounter: Payer: Self-pay | Admitting: Psychiatry

## 2018-02-12 DIAGNOSIS — F17210 Nicotine dependence, cigarettes, uncomplicated: Secondary | ICD-10-CM | POA: Diagnosis present

## 2018-02-12 DIAGNOSIS — F70 Mild intellectual disabilities: Secondary | ICD-10-CM | POA: Diagnosis present

## 2018-02-12 DIAGNOSIS — F1220 Cannabis dependence, uncomplicated: Secondary | ICD-10-CM | POA: Diagnosis present

## 2018-02-12 DIAGNOSIS — N390 Urinary tract infection, site not specified: Secondary | ICD-10-CM | POA: Diagnosis present

## 2018-02-12 DIAGNOSIS — F250 Schizoaffective disorder, bipolar type: Secondary | ICD-10-CM | POA: Diagnosis present

## 2018-02-12 DIAGNOSIS — G4700 Insomnia, unspecified: Secondary | ICD-10-CM | POA: Diagnosis present

## 2018-02-12 DIAGNOSIS — F17200 Nicotine dependence, unspecified, uncomplicated: Secondary | ICD-10-CM | POA: Diagnosis present

## 2018-02-12 DIAGNOSIS — F89 Unspecified disorder of psychological development: Secondary | ICD-10-CM | POA: Diagnosis present

## 2018-02-12 DIAGNOSIS — F312 Bipolar disorder, current episode manic severe with psychotic features: Secondary | ICD-10-CM | POA: Diagnosis not present

## 2018-02-12 HISTORY — DX: Bipolar disorder, unspecified: F31.9

## 2018-02-12 LAB — LIPID PANEL
CHOL/HDL RATIO: 3.7 ratio
Cholesterol: 170 mg/dL (ref 0–200)
HDL: 46 mg/dL (ref >40)
LDL Cholesterol: 111 mg/dL — ABNORMAL HIGH (ref 0–99)
TRIGLYCERIDES: 63 mg/dL (ref <150)
VLDL: 13 mg/dL (ref 0–40)

## 2018-02-12 LAB — TSH: TSH: 2.983 u[IU]/mL (ref 0.350–4.500)

## 2018-02-12 LAB — RPR: RPR: NONREACTIVE

## 2018-02-12 LAB — ACETAMINOPHEN LEVEL: Acetaminophen (Tylenol), Serum: <10 ug/mL — ABNORMAL LOW (ref 10–30)

## 2018-02-12 LAB — SALICYLATE LEVEL

## 2018-02-12 MED ORDER — MAGNESIUM HYDROXIDE 400 MG/5ML PO SUSP: 30.0000 mL | Freq: Every day | ORAL | Status: DC | PRN

## 2018-02-12 MED ORDER — LORAZEPAM 2 MG PO TABS
2.0000 mg | ORAL_TABLET | ORAL | Status: DC | PRN
  Administered 2018-02-13 – 2018-02-19 (×4): 2 mg via ORAL
  Filled 2018-02-12 (×5): qty 1

## 2018-02-12 MED ORDER — DIPHENHYDRAMINE HCL 25 MG PO CAPS
50.0000 mg | ORAL_CAPSULE | Freq: Four times a day (QID) | ORAL | Status: DC | PRN
  Administered 2018-02-17 – 2018-02-19 (×2): 50 mg via ORAL
  Filled 2018-02-12 (×2): qty 2

## 2018-02-12 MED ORDER — HALOPERIDOL 5 MG PO TABS
10.0000 mg | ORAL_TABLET | Freq: Four times a day (QID) | ORAL | Status: DC | PRN
  Administered 2018-02-17 – 2018-02-19 (×2): 10 mg via ORAL
  Filled 2018-02-12 (×2): qty 2

## 2018-02-12 MED ORDER — ADULT MULTIVITAMIN W/MINERALS CH
1.0000 | ORAL_TABLET | Freq: Every day | ORAL | Status: DC
Start: 2018-02-12 — End: 2018-03-07
  Administered 2018-02-12 – 2018-03-07 (×24): 1 via ORAL
  Filled 2018-02-12 (×23): qty 1

## 2018-02-12 MED ORDER — CEPHALEXIN 500 MG PO CAPS
500.0000 mg | ORAL_CAPSULE | Freq: Three times a day (TID) | ORAL | Status: DC
  Administered 2018-02-12 – 2018-02-16 (×12): 500 mg via ORAL
  Filled 2018-02-12 (×12): qty 1

## 2018-02-12 MED ORDER — QUETIAPINE FUMARATE 200 MG PO TABS
300.0000 mg | ORAL_TABLET | Freq: Every day | ORAL | Status: DC
  Administered 2018-02-12: 300 mg via ORAL
  Filled 2018-02-12: qty 2

## 2018-02-12 MED ORDER — HALOPERIDOL LACTATE 5 MG/ML IJ SOLN
10.0000 mg | Freq: Four times a day (QID) | INTRAMUSCULAR | Status: DC | PRN
  Administered 2018-02-13: 10 mg via INTRAMUSCULAR
  Filled 2018-02-12: qty 2

## 2018-02-12 MED ORDER — ENSURE ENLIVE PO LIQD
237.0000 mL | Freq: Three times a day (TID) | ORAL | Status: DC
  Administered 2018-02-12 – 2018-03-03 (×50): 237 mL via ORAL

## 2018-02-12 MED ORDER — HYDROXYZINE HCL 50 MG PO TABS
50.0000 mg | ORAL_TABLET | Freq: Three times a day (TID) | ORAL | Status: DC | PRN
  Administered 2018-02-16: 50 mg via ORAL
  Filled 2018-02-12 (×2): qty 1

## 2018-02-12 MED ORDER — ALUM & MAG HYDROXIDE-SIMETH 200-200-20 MG/5ML PO SUSP: 30.0000 mL | ORAL | Status: DC | PRN

## 2018-02-12 MED ORDER — ACETAMINOPHEN 325 MG PO TABS: 650.0000 mg | ORAL_TABLET | Freq: Four times a day (QID) | ORAL | Status: DC | PRN

## 2018-02-12 MED ORDER — VITAMIN B-12 1000 MCG PO TABS
1000.0000 ug | ORAL_TABLET | Freq: Every day | ORAL | Status: DC
  Administered 2018-02-12 – 2018-03-07 (×24): 1000 ug via ORAL
  Filled 2018-02-12 (×24): qty 1

## 2018-02-12 MED ORDER — LORAZEPAM 2 MG/ML IJ SOLN
2.0000 mg | INTRAMUSCULAR | Status: DC | PRN
  Administered 2018-02-13: 2 mg via INTRAMUSCULAR
  Filled 2018-02-12: qty 1

## 2018-02-12 MED ORDER — DIPHENHYDRAMINE HCL 50 MG/ML IJ SOLN
50.0000 mg | Freq: Four times a day (QID) | INTRAMUSCULAR | Status: DC | PRN
  Administered 2018-02-13: 50 mg via INTRAMUSCULAR
  Filled 2018-02-12: qty 1

## 2018-02-12 MED ORDER — QUETIAPINE FUMARATE 100 MG PO TABS
100.0000 mg | ORAL_TABLET | Freq: Every day | ORAL | Status: DC
  Administered 2018-02-12: 100 mg via ORAL
  Filled 2018-02-12: qty 1

## 2018-02-12 NOTE — [facility] Suicide Risk Assessment (Addendum)
[facility] INPATIENT:  Family/Significant Other Suicide Prevention Education  Suicide Prevention Education:  Contact Attempts:Lisa Devin, patients mother, [phone] has been identified by the patient as the family member/significant other with whom the patient will be residing, and identified as the person(s) who will aid the patient in the event of a mental health crisis.  With written consent from the patient, two attempts were made to provide suicide prevention education, prior to and/or following the patient's discharge.  We were unsuccessful in providing suicide prevention education.  A suicide education pamphlet was given to the patient to share with family/significant other.  Date and time of first attempt:CSW attempted to contact the patients family member/ significant other to complete SPE and obtain collateral information. CSW was unable to leave a voicemail. 02/12/2018 at 1:49PM.   Date and time of second attempt:  Johny ShearsCassandra  Amado Andal 02/12/2018, 1:48 PM

## 2018-02-12 NOTE — BHH Counselor (Signed)
Adult Comprehensive Assessment  Patient ID: Jenna Bell, female   DOB: June 21, 1986, 32 y.o.   MRN: [id number]  Information Source: Information source: Patient  Current Stressors:  Patient states their primary concerns and needs for treatment are:: Pt,. reports that she doesnt know why she is here. She says she remembers being at her mothers house and then the cops coming to get her. Patient states their goals for this hospitilization and ongoing recovery are:: Pt. reports that she wants some understanding Educational / Learning stressors: None Employment / Job issues: Pt. reports that she has a job Family Relationships: Pt. has a chaotic family Animal nutritionistsituation Financial / Lack of resources (include bankruptcy): None Housing / Lack of housing: Pt. may be homeless. Reports that she has some friends that she [place] go and [place] Physical health (include injuries & life threatening diseases): Pt. reports that she is pregnant Social relationships: None reported Substance abuse: Pt. reports using THC on a daily basis. Bereavement / Loss: None  Living/Environment/Situation:  Living Arrangements: Parent Living conditions (as described by patient or guardian): Pt. reports that she came to [place] to stay with her mother, but her mother doesnt want her there. Who else lives in the home?: Her and her mother How long has patient lived in current situation?: Since Sunday What is atmosphere in current home: Temporary  Family History:  Marital status: Single What is your sexual orientation?: Straight Has your sexual activity been affected by drugs, alcohol, medication, or emotional stress?: None Does patient have children?: No  Childhood History:  By whom was/is the patient raised?: Mother Description of patient's relationship with caregiver when they were a child: Pt. reports that she doesnt have a good relationhsip with her mother "I took care of myself since [age]" Pt. reports her  father died. Patient's description of current relationship with people who raised him/her: Pt. reports that she doesnt mess with her mother How were you disciplined when you got in trouble as a child/adolescent?: No discipline Does patient have siblings?: No Did patient suffer any verbal/emotional/physical/sexual abuse as a child?: No Did patient suffer from severe childhood neglect?: No Has patient ever been sexually abused/assaulted/raped as an adolescent or adult?: No Was the patient ever a victim of a crime or a disaster?: No Witnessed domestic violence?: No Has patient been effected by domestic violence as an adult?: No  Education:  Highest grade of school patient has completed: 4 years of college Currently a student?: No Learning disability?: Yes What learning problems does patient have?: Pt. 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  Employment/Work Situation:   Employment situation: Employed Where is patient currently employed?: [employer] [place] How long has patient been employed?: 2 years Patient's job has been impacted by current illness: No What is the longest time patient has a held a job?: Pt. reports that she can not remember Did You Receive Any Psychiatric Treatment/Services While in the Military?: No Are There Guns or Other Weapons in Your Home?: No  Financial Resources:   Financial resources: Income from employment Does patient have a representative payee or guardian?: No  Alcohol/Substance Abuse:   What has been your use of drugs/alcohol within the last 12 months?: THC, daily, a couple of blunts a day since the age of 32 If attempted suicide, did drugs/alcohol play a role in this?: No Alcohol/Substance Abuse Treatment Hx: Denies past history Has alcohol/substance abuse ever caused legal problems?: No  Social Support System:   Forensic psychologistatient's Community Support  System: None Type of faith/religion: None How does patient's faith help to cope with  current illness?: N/A  Leisure/Recreation:   Leisure and Hobbies: Walking  Strengths/Needs:   What is the patient's perception of their strengths?: Good at following directions Patient states they can use these personal strengths during their treatment to contribute to their recovery: None Patient states these barriers may affect/interfere with their treatment: Pt. reports that there is nothing wrong with her Patient states these barriers may affect their return to the community: Pt. may be homeless but reports that she is going back to [place] to stay with her frineds. Other important information patient would like considered in planning for their treatment: Pt. reports that she is not interested in any substance abuse treatment.  Discharge Plan:   Currently receiving community mental health services: No Patient states concerns and preferences for aftercare planning are: Pt. reports that there is nothing wrong with her. Patient states they will know when they are safe and ready for discharge when: Pt. reports "I want to be discharged now." Does patient have access to transportation?: No Does patient have financial barriers related to discharge medications?: Yes Patient description of barriers related to discharge medications: No income and no insurcance Plan for no access to transportation at discharge: CSW will assess for transportation means at discharge Will patient be returning to same living situation after discharge?: Yes(Pt. reports that if her mother will allow her to, she will go back to [place] and if not she will go to a friends house in [place])  Summary/Recommendations:   Summary and Recommendations (to be completed by the evaluator): Patient is a 32 year old African American female admitted involuntarily by her mother due to bizarre behavior. The patient reports that she doesn't know why she is here and seems to be very irritable.  The patient reports that she came to  [place] on Sunday to live with her mother. There are reports that the patient was yelling and talking to people outside who were not real. The patient reports that her mother is just mad because she is pregnant and needs to mind her own business. The patient denies any stressors outside of family conflict. She reports using THC, several blunts, daily. Her UDS was positive for THC. His affect was inappropriate and labile. At discharge, patient wants to return [facility] or go back to [place]. While here, patient will benefit from crisis stabilization, medication evaluation, group therapy and psychoeducation, in addition to case management for discharge planning. At discharge, it is recommended that patient remain compliant with the established discharge plan and continue treatment.   [name]. 02/12/2018

## 2018-02-12 NOTE — [facility] Suicide Risk Assessment (Addendum)
[facility]   Nursing information obtained from:    Demographic factors:  Adolescent or young adult, Low socioeconomic status, Unemployed Current Mental Status:  NA Loss Factors:  Financial problems / change in socioeconomic status, Decrease in vocational status Historical Factors:  Impulsivity Risk Reduction Factors:  Living with another person, especially a relative  Total Time spent with patient: 1 hour Principal Problem: Bipolar I disorder, current or most recent episode manic, with psychotic features (HCC) Diagnosis:   Patient Active Problem List   Diagnosis Date Noted  . Bipolar I disorder, current or most recent episode manic, with psychotic features (HCC) [F31.2] 02/11/2018    Priority: High  . Tobacco use disorder [F17.200] 02/12/2018  . Cannabis use disorder, moderate, dependence (HCC) [F12.20] 02/12/2018  . UTI (urinary tract infection) [N39.0] 02/12/2018   Subjective Data: psychotic break.  Continued Clinical Symptoms:  Alcohol Use Disorder Identification Test Final Score (AUDIT): 3 The "Alcohol Use Disorders Identification Test", Guidelines for Use in Primary Care, Second Edition.  World Science writerHealth Organization [facility](WHO). Score between 0-7:  no or low risk or alcohol related problems. Score between 8-15:  moderate risk of alcohol related problems. Score between 16-19:  high risk of alcohol related problems. Score 20 or above:  warrants further diagnostic evaluation for alcohol dependence and treatment.   CLINICAL FACTORS:   Bipolar Disorder:   Mixed State Alcohol/Substance Abuse/Dependencies Currently Psychotic   Musculoskeletal: Strength & Muscle Tone: within normal limits Gait & Station: normal Patient leans: N/A  Psychiatric Specialty Exam: Physical Exam  Nursing note and vitals reviewed. Psychiatric: Her affect is angry and inappropriate. Her speech is rapid and/or pressured. She is hyperactive. Thought content is delusional. Cognition  and memory are impaired. She expresses impulsivity. She is noncommunicative. She is inattentive.    Review of Systems  Neurological: Negative.   Psychiatric/Behavioral: Positive for hallucinations and substance abuse. The patient has insomnia.   All other systems reviewed and are negative.   Blood pressure 113/68, pulse 70, temperature 98.7 F (37.1 C), temperature source Oral, resp. rate 18, height 5\' 5"  (1.651 m), weight 55.3 kg (122 lb), last menstrual period 02/10/2018, SpO2 100 %.Body mass index is 20.3 kg/m.  General Appearance: Disheveled  Eye Contact:  Minimal  Speech:  Garbled  Volume:  Increased  Mood:  Angry, Dysphoric and Irritable  Affect:  Congruent  Thought Process:  Disorganized, Irrelevant and Descriptions of Associations: Loose  Orientation:  Full (Time, Place, and Person)  Thought Content:  Delusions, Hallucinations: Auditory and Paranoid Ideation  Suicidal Thoughts:  No  Homicidal Thoughts:  No  Memory:  Immediate;   Poor Recent;   Poor Remote;   Poor  Judgement:  Poor  Insight:  Lacking  Psychomotor Activity:  Increased  Concentration:  Concentration: Poor and Attention Span: Poor  Recall:  Poor  Fund of Knowledge:  Poor  Language:  Poor  Akathisia:  No  Handed:  Right  AIMS (if indicated):     Assets:  Communication Skills Desire for Improvement Physical Health Resilience Social Support  ADL's:  Intact  Cognition:  WNL  Sleep:  Number of Hours: 5      COGNITIVE FEATURES THAT CONTRIBUTE TO RISK:  Closed-mindedness    SUICIDE RISK:   Moderate:  Frequent suicidal ideation with limited intensity, and duration, some specificity in terms of plans, no associated intent, good self-control, limited dysphoria/symptomatology, some risk factors present, and identifiable protective factors, including available and accessible social support.  PLAN OF CARE: hospital  admission, medication management, substance abuse counseling, discharge planning.  Ms.  Marlar is a 32 year old female with unknown psychiatric history admitted for psychotic break.  #Agitation, resolved  #Mood and psychosis -increase Seroquel to 300 mg nightly, patient accepted medication  #Insomnia -Trazodone 100 mg nightly  #UTI -cx pending -Keflex 500 mg TID  #Substance abuse -positive for cannabis -unable to discuss substance abuse treatment  #Smoking cessation -nicotine patch is available  #B12 defficiency -Vit B12 1000 ug daily  #Weight loss -MVI -Ensure TID  #Labs -lipid panel, TSH and A1C -EKG -head CT scan is negative -pregnancy test negative  #Disposition -discharge with family -follow up with [facility]    I certify that inpatient services furnished can reasonably be expected to improve the patient's condition.   Kristine Linea, MD 02/12/2018, 11:53 AM

## 2018-02-12 NOTE — Progress Notes (Signed)
Patient ID: Jenna Bell, female   DOB: [birth year], 32 y.o.   MRN: [id number]   [facility] that the patient demands discharge and is getting agitated. She may require forced medications. I will ask Dr. Toni Amendlapacs for second opinion. Haldol, Ativan and Benadryl are ardered.

## 2018-02-12 NOTE — [facility] Group Notes (Signed)
LCSW Group Therapy Note  02/12/2018 1:00 pm  Type of Therapy/Topic:  Group Therapy:  Emotion Regulation  Participation Level:  Did Not Attend   Description of Group:    The purpose of this group is to assist patients in learning to regulate negative emotions and experience positive emotions. Patients will be guided to discuss ways in which they have been vulnerable to their negative emotions. These vulnerabilities will be juxtaposed with experiences of positive emotions or situations, and patients will be challenged to use positive emotions to combat negative ones. Special emphasis will be placed on coping with negative emotions in conflict situations, and patients will process healthy conflict resolution skills.  Therapeutic Goals: 1. Patient will identify two positive emotions or experiences to reflect on in order to balance out negative emotions 2. Patient will label two or more emotions that they find the most difficult to experience 3. Patient will demonstrate positive conflict resolution skills through discussion and/or role plays  Summary of Patient Progress:  Jenna Bell was invited to today's group, but chose not to attend.     Therapeutic Modalities:   Cognitive Behavioral Therapy Feelings Identification Dialectical Behavioral Therapy

## 2018-02-12 NOTE — Progress Notes (Signed)
Recreation Therapy Notes  Date: 02/12/2018  Time: 9:30 am   Location: Craft Room   Behavioral response: N/A   Intervention Topic:  Coping  Discussion/Intervention: Patient did not attend group.   Clinical Observations/Feedback:  Patient did not attend group.   [name] LRT/CTRS         Jenna Bell 02/12/2018 11:57 AM 

## 2018-02-12 NOTE — Progress Notes (Signed)
Recreation Therapy Notes  INPATIENT RECREATION THERAPY ASSESSMENT  Patient Details Name: Nicola PoliceLatoya S Coin MRN: [id number] DOB: 09-11-1985 Today's Date: 02/12/2018  Patient refused assessment she stated that she should not be in here and does not need anything.       Information Obtained From:    Able to Participate in Assessment/Interview:    Patient Presentation:    Reason for Admission (Per Patient):    Patient Stressors:    Coping Skills:      Leisure Interests (2+):     Frequency of Recreation/Participation:    Awareness of Community Resources:     [employer]Community Resources:     Current Use:    If no, Barriers?:    Expressed Interest in State Street CorporationCommunity Resource Information:    [place]     Patient Main Form of Transportation:    Patient Strengths:     Patient Identified Areas of Improvement:     Patient Goal for Hospitalization:     Current SI (including self-harm):     Current HI:     Current AVH:    Staff Intervention Plan:    Consent to Intern Participation:    Maddyson Keil 02/12/2018, 1:46 PM

## 2018-02-12 NOTE — Progress Notes (Signed)
   02/12/18 1045  Clinical Encounter Type  Visited With Patient  Visit Type Initial;Spiritual support;Behavioral Health  Referral From Care management  Consult/Referral To Chaplain  Spiritual Encounters  Spiritual Needs Other (Comment)   PT came to the Treatment team meeting not knowing where she was and why she was there. She stated that she was fine and would not sign the paper stated that she had attended the meeting. PT was agitated by the whole process.

## 2018-02-12 NOTE — H&P (Signed)
Psychiatric Admission Assessment Adult  Patient Identification: Jenna Bell MRN:  [id number] Date of Evaluation:  02/12/2018 Chief Complaint:  Bipolar Principal Diagnosis: Bipolar I disorder, current or most recent episode manic, with psychotic features [facility]) Diagnosis:   Patient Active Problem List   Diagnosis Date Noted  . Bipolar I disorder, current or most recent episode manic, with psychotic features (HCC) [F31.2] 02/11/2018    Priority: High  . Tobacco use disorder [F17.200] 02/12/2018  . Cannabis use disorder, moderate, dependence (HCC) [F12.20] 02/12/2018  . UTI (urinary tract infection) [N39.0] 02/12/2018   History of Present Illness:   Identifying data. Ms. Ranieri is a 32 year old female with unknown psychiatric history.  Chief complaint. "I am fine, there is nothing wrong with me."  History of present illness. Information was obtained from the patient and the chart. The patient was brouht to the ER by police on petition from her family for bizarre behavior. The patient lives in [place] but has been visiting her family in [place] for a few days. She has been hallucinating, easily agitated and unable to care for herself. She has not been eating, lost a lot of weight and started losing hair. She has been insomniac and paranoid. She has been aggressive towards her family. She has been yelling loudly in front of her mother's house, in the ER and on the unit.  The patient herself is hardly able to provide information. Denies any symptoms of depression, anxiety or psychosis and demands immediate discharge. Her speech is extremely difficult to understand. She is poorly groomed, irritable and impatient.   Past psychitaric history. Unknwn. Apparently the patient did seek treatment in the past but nevel followed up. The family believes that the patient has not been taking her medications. The patient denies ever having problems or being hospitalized.  Family psychiatric history.  Unknown.  Social history. Reportedly, the patient lives with a friend in [place] but intended to move in with her mother on Monday. She hopes to be able to return to her mother's place. If not allowed to return, sh will go back with a friend. She tells me that she has a job at [employer].  Total Time spent with patient: 1 hour  Is the patient at risk to self? No.  Has the patient been a risk to self in the past 6 months? No.  Has the patient been a risk to self within the distant past? No.  Is the patient a risk to others? No.  Has the patient been a risk to others in the past 6 months? No.  Has the patient been a risk to others within the distant past? No.   Prior Inpatient Therapy:   Prior Outpatient Therapy:    Alcohol Screening: 1. How often do you have a drink containing alcohol?: 2 to 4 times a month 2. How many drinks containing alcohol do you have on a typical day when you are drinking?: 3 or 4 3. How often do you have six or more drinks on one occasion?: Never AUDIT-C Score: 3 4. How often during the last year have you found that you were not able to stop drinking once you had started?: Never 5. How often during the last year have you failed to do what was normally expected from you becasue of drinking?: Never 6. How often during the last year have you needed a first drink in the morning to get yourself going after a heavy drinking session?: Never 7. How often during the last  year have you had a feeling of guilt of remorse after drinking?: Never 8. How often during the last year have you been unable to remember what happened the night before because you had been drinking?: Never 9. Have you or someone else been injured as a result of your drinking?: No 10. Has a relative or friend or a doctor or another health worker been concerned about your drinking or suggested you cut down?: No Alcohol Use Disorder Identification Test Final Score (AUDIT): 3 Intervention/Follow-up:  AUDIT Score <7 follow-up not indicated Substance Abuse History in the last 12 months:  Yes.   Consequences of Substance Abuse: Negative Previous Psychotropic Medications: No  Psychological Evaluations: No  Past Medical History:  Past Medical History:  Diagnosis Date  . Bipolar disorder (HCC)    History reviewed. No pertinent surgical history. Family History: History reviewed. No pertinent family history.  Tobacco Screening: Have you used any form of tobacco in the last 30 days? (Cigarettes, Smokeless Tobacco, Cigars, and/or Pipes): Yes Tobacco use, Select all that apply: 4 or less cigarettes per day Are you interested in Tobacco Cessation Medications?: Yes, will notify MD for an order Social History:  Social History   Substance and Sexual Activity  Alcohol Use Yes  . Alcohol/week: 1.2 oz  . Types: 2 Glasses of wine per week  . Frequency: Never     Social History   Substance and Sexual Activity  Drug Use Yes  . Types: Methamphetamines    Additional Social History: Marital status: Single What is your sexual orientation?: Straight Has your sexual activity been affected by drugs, alcohol, medication, or emotional stress?: None Does patient have children?: No                         Allergies:  No Known Allergies Lab Results:  Results for orders placed or performed during the hospital encounter of 02/12/18 (from the past 48 hour(s))  Lipid panel     Status: Abnormal   Collection Time: 02/12/18  7:29 AM  Result Value Ref Range   Cholesterol 170 0 - 200 mg/dL   Triglycerides 63 <440<150 mg/dL   HDL 46 >34>40 mg/dL   Total CHOL/HDL Ratio 3.7 RATIO   VLDL 13 0 - 40 mg/dL   LDL Cholesterol 742111 (H) 0 - 99 mg/dL    Comment:        Total Cholesterol/HDL:CHD Risk Coronary Heart Disease Risk Table                     Men   Women  1/2 Average Risk   3.4   3.3  Average Risk       5.0   4.4  2 X Average Risk   9.6   7.1  3 X Average Risk  23.4   11.0        Use the  calculated Patient Ratio above and the CHD Risk Table to determine the patient's CHD Risk.        ATP III CLASSIFICATION (LDL):  <100     mg/dL   Optimal  [id number]  mg/dL   Near or Above                    Optimal  130-159  mg/dL   Borderline  [id number]  mg/dL   High  >295>190     mg/dL   Very High Performed at [facility], [address] [place], [place]  [address]   TSH     Status: None   Collection Time: 02/12/18  7:29 AM  Result Value Ref Range   TSH 2.983 0.350 - 4.500 uIU/mL    Comment: Performed by a 3rd Generation assay with a functional sensitivity of <=0.01 uIU/mL. Performed at [facility], [address] [place], [place] [address]   Acetaminophen level     Status: Abnormal   Collection Time: 02/12/18  7:29 AM  Result Value Ref Range   Acetaminophen (Tylenol), Serum <10 (L) 10 - 30 ug/mL    Comment: (NOTE) Therapeutic concentrations vary significantly. A range of 10-30 ug/mL  may be an effective concentration for many patients. However, some  are best treated at concentrations outside of this range. Acetaminophen concentrations >150 ug/mL at 4 hours after ingestion  and >50 ug/mL at 12 hours after ingestion are often associated with  toxic reactions. Performed at [facility], [address] [place], [place] [address]   Salicylate level     Status: None   Collection Time: 02/12/18  7:29 AM  Result Value Ref Range   Salicylate Lvl <7.0 2.8 - 30.0 mg/dL    Comment: Performed at [facility], [address] [place], [place] [address]    Blood Alcohol level:  Lab Results  Component Value Date   [facility] <10 02/11/2018    Metabolic Disorder Labs:  No results found for: HGBA1C, MPG No results found for: PROLACTIN Lab Results  Component Value Date   CHOL 170 02/12/2018   TRIG 63 02/12/2018   HDL 46 02/12/2018   CHOLHDL 3.7 02/12/2018   VLDL 13 02/12/2018   LDLCALC 111 (H) 02/12/2018    Current Medications: Current  Facility-Administered Medications  Medication Dose Route Frequency Provider Last Rate Last Dose  . alum & mag hydroxide-simeth (MAALOX/MYLANTA) 200-200-20 MG/5ML suspension 30 mL  30 mL Oral Q4H PRN Clapacs, John T, MD      . cephALEXin (KEFLEX) capsule 500 mg  500 mg Oral Q8H Clapacs, John T, MD   500 mg at 02/12/18 0604  . feeding supplement (ENSURE ENLIVE) (ENSURE ENLIVE) liquid 237 mL  237 mL Oral TID BM Jencarlos Nicolson B, MD      . hydrOXYzine (ATARAX/VISTARIL) tablet 50 mg  50 mg Oral TID PRN Clapacs, John T, MD      . magnesium hydroxide (MILK OF MAGNESIA) suspension 30 mL  30 mL Oral Daily PRN Clapacs, John T, MD      . multivitamin with minerals tablet 1 tablet  1 tablet Oral Daily Random Dobrowski B, MD   1 tablet at 02/12/18 0835  . QUEtiapine (SEROQUEL) tablet 100 mg  100 mg Oral QHS Clapacs, John T, MD   100 mg at 02/12/18 0102  . vitamin B-12 (CYANOCOBALAMIN) tablet 1,000 mcg  1,000 mcg Oral Daily Judith Demps B, MD   1,000 mcg at 02/12/18 0835   PTA Medications: No medications prior to admission.    Musculoskeletal: Strength & Muscle Tone: within normal limits Gait & Station: normal Patient leans: N/A  Psychiatric Specialty Exam: I reviewed physical examination performed in the ER and agree with the findings. Physical Exam  Nursing note and vitals reviewed. Psychiatric: Her affect is angry, labile and inappropriate. She is hyperactive and actively hallucinating. Thought content is paranoid and delusional. Cognition and memory are impaired. She expresses impulsivity. She is noncommunicative.    Review of Systems  Neurological: Negative.   Psychiatric/Behavioral: Positive for hallucinations and substance abuse. The patient has  insomnia. The patient is not nervous/anxious.   All other systems reviewed and are negative.   Blood pressure 113/68, pulse 70, temperature 98.7 F (37.1 C), temperature source Oral, resp. rate 18, height 5\' 5"  (1.651 m), weight 55.3 kg  (122 lb), last menstrual period 02/10/2018, SpO2 100 %.Body mass index is 20.3 kg/m.   See SRA                                                  Sleep:  Number of Hours: 5    Treatment Plan Summary: Daily contact with patient to assess and evaluate symptoms and progress in treatment and Medication management   Ms. Brundidge is a 32 year old female with unknown psychiatric history admitted for psychotic break.  #Agitation, resolved  #Mood and psychosis -increase Seroquel to 300 mg nightly, patient accepted medication  #Insomnia -Trazodone 100 mg nightly  #UTI -cx pending -Keflex 500 mg TID  #Substance abuse -positive for cannabis -unable to discuss substance abuse treatment  #Smoking cessation -nicotine patch is available  #B12 defficiency -Vit B12 1000 ug daily  #Weight loss -MVI -Ensure TID  #Labs -lipid panel, TSH and A1C -EKG -head CT scan is negative -pregnancy test negative  #Disposition -discharge with family -follow [facility] with [facility]  Observation Level/Precautions:  15 minute checks  Laboratory:  CBC Chemistry Profile HbAIC UDS UA  Psychotherapy:    Medications:    Consultations:    Discharge Concerns:    Estimated LOS:  Other:     Physician Treatment Plan for Primary Diagnosis: Bipolar I disorder, current or most recent episode manic, with psychotic features (HCC) Long Term Goal(s): Improvement in symptoms so as ready for discharge  Short Term Goals: Ability to identify changes in lifestyle to reduce recurrence of condition will improve, Ability to verbalize feelings will improve, Ability to disclose and discuss suicidal ideas, Ability to demonstrate self-control will improve, Ability to identify and develop effective coping behaviors will improve, Ability to maintain clinical measurements within normal limits will improve and Compliance with prescribed medications will improve  Physician Treatment Plan for Secondary  Diagnosis: Principal Problem:   Bipolar I disorder, current or most recent episode manic, with psychotic features (HCC) Active Problems:   Tobacco use disorder   Cannabis use disorder, moderate, dependence (HCC)   UTI (urinary tract infection)  Long Term Goal(s): Improvement in symptoms so as ready for discharge  Short Term Goals: Ability to identify changes in lifestyle to reduce recurrence of condition will improve, Ability to demonstrate self-control will improve and Ability to identify triggers associated with substance abuse/mental health issues will improve  I certify that inpatient services furnished can reasonably be expected to improve the patient's condition.    Kristine Linea, MD 7/10/201911:53 AM

## 2018-02-12 NOTE — Tx Team (Addendum)
Interdisciplinary Treatment and Diagnostic Plan Update  02/12/2018 Time of Session: 10:30am Jenna PoliceLatoya S Bell MRN: [id number]  Principal Diagnosis: Bipolar I disorder, current or most recent episode manic, with psychotic features (HCC)  Secondary Diagnoses: Principal Problem:   Bipolar I disorder, current or most recent episode manic, with psychotic features (HCC) Active Problems:   Tobacco use disorder   Cannabis use disorder, moderate, dependence (HCC)   UTI (urinary tract infection)   Current Medications:  Current Facility-Administered Medications  Medication Dose Route Frequency Provider Last Rate Last Dose  . alum & mag hydroxide-simeth (MAALOX/MYLANTA) 200-200-20 MG/5ML suspension 30 mL  30 mL Oral Q4H PRN Clapacs, John T, MD      . cephALEXin (KEFLEX) capsule 500 mg  500 mg Oral Q8H Clapacs, John T, MD   500 mg at 02/12/18 0604  . feeding supplement (ENSURE ENLIVE) (ENSURE ENLIVE) liquid 237 mL  237 mL Oral TID BM Pucilowska, Jolanta B, MD      . hydrOXYzine (ATARAX/VISTARIL) tablet 50 mg  50 mg Oral TID PRN Clapacs, John T, MD      . magnesium hydroxide (MILK OF MAGNESIA) suspension 30 mL  30 mL Oral Daily PRN Clapacs, John T, MD      . multivitamin with minerals tablet 1 tablet  1 tablet Oral Daily Pucilowska, Jolanta B, MD   1 tablet at 02/12/18 0835  . QUEtiapine (SEROQUEL) tablet 300 mg  300 mg Oral QHS Pucilowska, Jolanta B, MD      . vitamin B-12 (CYANOCOBALAMIN) tablet 1,000 mcg  1,000 mcg Oral Daily Pucilowska, Jolanta B, MD   1,000 mcg at 02/12/18 0835   PTA Medications: No medications prior to admission.    Patient Stressors: Medication change or noncompliance Substance abuse  Patient Strengths: Motivation for treatment/growth Supportive family/friends  Treatment Modalities: Medication Management, Group therapy, Case management,  1 to 1 session with clinician, Psychoeducation, Recreational therapy.   Physician Treatment Plan for Primary Diagnosis: Bipolar I  disorder, current or most recent episode manic, with psychotic features (HCC) Long Term Goal(s): Improvement in symptoms so as ready for discharge Improvement in symptoms so as ready for discharge   Short Term Goals: Ability to identify changes in lifestyle to reduce recurrence of condition will improve Ability to verbalize feelings will improve Ability to disclose and discuss suicidal ideas Ability to demonstrate self-control will improve Ability to identify and develop effective coping behaviors will improve Ability to maintain clinical measurements within normal limits will improve Compliance with prescribed medications will improve Ability to identify changes in lifestyle to reduce recurrence of condition will improve Ability to demonstrate self-control will improve Ability to identify triggers associated with substance abuse/mental health issues will improve  Medication Management: Evaluate patient's response, side effects, and tolerance of medication regimen.  Therapeutic Interventions: 1 to 1 sessions, Unit Group sessions and Medication administration.  Evaluation of Outcomes: Not Progressing  Physician Treatment Plan for Secondary Diagnosis: Principal Problem:   Bipolar I disorder, current or most recent episode manic, with psychotic features (HCC) Active Problems:   Tobacco use disorder   Cannabis use disorder, moderate, dependence (HCC)   UTI (urinary tract infection)  Long Term Goal(s): Improvement in symptoms so as ready for discharge Improvement in symptoms so as ready for discharge   Short Term Goals: Ability to identify changes in lifestyle to reduce recurrence of condition will improve Ability to verbalize feelings will improve Ability to disclose and discuss suicidal ideas Ability to demonstrate self-control will improve Ability to identify and develop  effective coping behaviors will improve Ability to maintain clinical measurements within normal limits will  improve Compliance with prescribed medications will improve Ability to identify changes in lifestyle to reduce recurrence of condition will improve Ability to demonstrate self-control will improve Ability to identify triggers associated with substance abuse/mental health issues will improve     Medication Management: Evaluate patient's response, side effects, and tolerance of medication regimen.  Therapeutic Interventions: 1 to 1 sessions, Unit Group sessions and Medication administration.  Evaluation of Outcomes: Not Progressing   RN Treatment Plan for Primary Diagnosis: Bipolar I disorder, current or most recent episode manic, with psychotic features (HCC) Long Term Goal(s): Knowledge of disease and therapeutic regimen to maintain health will improve  Short Term Goals: Ability to participate in decision making will improve, Ability to verbalize feelings will improve, Ability to identify and develop effective coping behaviors will improve and Compliance with prescribed medications will improve  Medication Management: RN will administer medications as ordered by provider, will assess and evaluate patient's response and provide education to patient for prescribed medication. RN will report any adverse and/or side effects to prescribing provider.  Therapeutic Interventions: 1 on 1 counseling sessions, Psychoeducation, Medication administration, Evaluate responses to treatment, Monitor vital signs and CBGs as ordered, Perform/monitor CIWA, COWS, AIMS and Fall Risk screenings as ordered, Perform wound care treatments as ordered.  Evaluation of Outcomes: Not Progressing   LCSW Treatment Plan for Primary Diagnosis: Bipolar I disorder, current or most recent episode manic, with psychotic features (HCC) Long Term Goal(s): Safe transition to appropriate next level of care at discharge, Engage patient in therapeutic group addressing interpersonal concerns.  Short Term Goals: Engage patient in  aftercare planning with referrals and resources, Increase social support, Facilitate acceptance of mental health diagnosis and concerns, Identify triggers associated with mental health/substance abuse issues and Increase skills for wellness and recovery  Therapeutic Interventions: Assess for all discharge needs, 1 to 1 time with Social worker, Explore available resources and support systems, Assess for adequacy in community support network, Educate family and significant other(s) on suicide prevention, Complete Psychosocial Assessment, Interpersonal group therapy.  Evaluation of Outcomes: Not Progressing   Progress in Treatment: Attending groups: No. Participating in groups: No. Taking medication as prescribed: Yes. Toleration medication: Yes. Family/Significant other contact made: No, will contact:  Patients mother Lestine Rahe Patient understands diagnosis: No. Discussing patient identified problems/goals with staff: Yes. Medical problems stabilized or resolved: Yes. Denies suicidal/homicidal ideation: Yes. Issues/concerns per patient self-inventory: No. Other:   New problem(s) identified: No, Describe:  None  New Short Term/Long Term Goal(s): No goal  Patient Goals:  No goal  [place] or Barriers[place] and [place] up with outpatient treatment.  Reason for Continuation of Hospitalization: Medication stabilization  Estimated Length of Stay: 7 days  Recreational Therapy: Patient Stressors: N/A Patient Goal: Patient will engage in interactions with peers and staff in pro-social manner at least 2x within 5 recreation therapy group sessions  Attendees: Patient: Jenna Bell 02/12/2018 1:50 PM  Physician: Dr. Jennet Maduro, MD 02/12/2018 1:50 PM  Nursing: Hulan Amato, RN 02/12/2018 1:50 PM  RN Care Manager: 02/12/2018 1:50 PM  Social Worker: [name], LCSWA 02/12/2018 1:50 PM  Recreational Therapist: Danella Deis. Tanique Matney CTRS, LRT 02/12/2018  1:50 PM  Other: Heidi Dach, LCSW 02/12/2018 1:50 PM  Other: Huey Romans, LCSW 02/12/2018 1:50 PM  Other:John Joaquin Music 02/12/2018 1:50 PM    Scribe for Treatment Team: [name], LCSW 02/12/2018 1:50 PM

## 2018-02-12 NOTE — Progress Notes (Addendum)
[facility]  02/13/2018 10:10 AM Nicola PoliceLatoya S Conerly  MRN:  [id number]  Subjective:    Ms. Marylyn IshiharaHerbin is loud, argumentative and rude. She is hallucinating loudly in her room but denies any symptoms. Her speech is loud and pressured. She is hard to understand. She is delusional. He demands immediate discharge, threatens me with her lawyers and asks me to leave the room. She was agitated last night and PRN medications were ordered but there was no need to administer any. She took medications this morning.   Principal Problem: Bipolar I disorder, current or most recent episode manic, with psychotic features (HCC) Diagnosis:   Patient Active Problem List   Diagnosis Date Noted  . Bipolar I disorder, current or most recent episode manic, with psychotic features (HCC) [F31.2] 02/11/2018    Priority: High  . Tobacco use disorder [F17.200] 02/12/2018  . Cannabis use disorder, moderate, dependence (HCC) [F12.20] 02/12/2018  . UTI (urinary tract infection) [N39.0] 02/12/2018   Total Time spent with patient: 20 minutes  Past Psychiatric History: unknown  Past Medical History:  Past Medical History:  Diagnosis Date  . Bipolar disorder (HCC)    History reviewed. No pertinent surgical history. Family History: History reviewed. No pertinent family history. Family Psychiatric  History: unknown Social History:  Social History   Substance and Sexual Activity  Alcohol Use Yes  . Alcohol/week: 1.2 oz  . Types: 2 Glasses of wine per week  . Frequency: Never     Social History   Substance and Sexual Activity  Drug Use Yes  . Types: Methamphetamines    Social History   Socioeconomic History  . Marital status: Single    Spouse name: Not on file  . Number of children: Not on file  . Years of education: Not on file  . Highest education level: Not on file  Occupational History  . Not on file  Social Needs  . Financial resource strain: Not on file  . Food insecurity:    Worry: Not on  file    Inability: Not on file  . Transportation needs:    Medical: Not on file    Non-medical: Not on file  Tobacco Use  . Smoking status: Light Tobacco Smoker    Packs/day: 0.25  . Smokeless tobacco: Never Used  Substance and Sexual Activity  . Alcohol use: Yes    Alcohol/week: 1.2 oz    Types: 2 Glasses of wine per week    Frequency: Never  . Drug use: Yes    Types: Methamphetamines  . Sexual activity: Yes  Lifestyle  . Physical activity:    Days per week: Not on file    Minutes per session: Not on file  . Stress: Not on file  Relationships  . Social connections:    Talks on phone: Not on file    Gets together: Not on file    Attends religious service: Not on file    Active member of club or organization: Not on file    Attends meetings of clubs or organizations: Not on file    Relationship status: Not on file  Other Topics Concern  . Not on file  Social History Narrative  . Not on file   Additional Social History:                         Sleep: Poor  Appetite:  Poor  Current Medications: Current Facility-Administered Medications  Medication Dose Route Frequency Provider  Last Rate Last Dose  . alum & mag hydroxide-simeth (MAALOX/MYLANTA) 200-200-20 MG/5ML suspension 30 mL  30 mL Oral Q4H PRN Clapacs, John T, MD      . cephALEXin (KEFLEX) capsule 500 mg  500 mg Oral Q8H Clapacs, John T, MD   500 mg at 02/13/18 0609  . diphenhydrAMINE (BENADRYL) capsule 50 mg  50 mg Oral Q6H PRN Mikell Camp B, MD       Or  . diphenhydrAMINE (BENADRYL) injection 50 mg  50 mg Intramuscular Q6H PRN Wilkes Potvin B, MD   50 mg at 02/13/18 1009  . feeding supplement (ENSURE ENLIVE) (ENSURE ENLIVE) liquid 237 mL  237 mL Oral TID BM Rayana Geurin B, MD   237 mL at 02/12/18 2000  . haloperidol (HALDOL) tablet 10 mg  10 mg Oral Q6H PRN Tsuneo Faison B, MD       Or  . haloperidol lactate (HALDOL) injection 10 mg  10 mg Intramuscular Q6H PRN Ayomide Zuleta,  Lacheryl Niesen B, MD   10 mg at 02/13/18 1009  . hydrOXYzine (ATARAX/VISTARIL) tablet 50 mg  50 mg Oral TID PRN Clapacs, John T, MD      . LORazepam (ATIVAN) tablet 2 mg  2 mg Oral Q4H PRN Whit Bruni B, MD       Or  . LORazepam (ATIVAN) injection 2 mg  2 mg Intramuscular Q4H PRN Jaquana Geiger B, MD   2 mg at 02/13/18 1005  . magnesium hydroxide (MILK OF MAGNESIA) suspension 30 mL  30 mL Oral Daily PRN Clapacs, John T, MD      . multivitamin with minerals tablet 1 tablet  1 tablet Oral Daily Varetta Chavers B, MD   1 tablet at 02/13/18 0916  . QUEtiapine (SEROQUEL) tablet 300 mg  300 mg Oral QHS Lanetra Hartley B, MD   300 mg at 02/12/18 2134  . vitamin B-12 (CYANOCOBALAMIN) tablet 1,000 mcg  1,000 mcg Oral Daily Lanore Renderos B, MD   1,000 mcg at 02/13/18 0981    Lab Results:  Results for orders placed or performed during the hospital encounter of 02/12/18 (from the past 48 hour(s))  Hemoglobin A1c     Status: None   Collection Time: 02/12/18  7:29 AM  Result Value Ref Range   Hgb A1c MFr Bld 5.4 4.8 - 5.6 %    Comment: (NOTE)         Prediabetes: 5.7 - 6.4         Diabetes: >6.4         Glycemic control for adults with diabetes: <7.0    Mean Plasma Glucose 108 mg/dL    Comment: (NOTE) Performed At: [facility] [address] [place], [place] [id number] Jolene Schimke MD AO:[id number]   Lipid panel     Status: Abnormal   Collection Time: 02/12/18  7:29 AM  Result Value Ref Range   Cholesterol 170 0 - 200 mg/dL   Triglycerides 63 <962 mg/dL   HDL 46 >95 mg/dL   Total CHOL/HDL Ratio 3.7 RATIO   VLDL 13 0 - 40 mg/dL   LDL Cholesterol 284 (H) 0 - 99 mg/dL    Comment:        Total Cholesterol/HDL:CHD Risk Coronary Heart Disease Risk Table                     Men   Women  1/2 Average Risk   3.4   3.3  Average Risk       5.0  4.4  2 X Average Risk   9.6   7.1  3 X Average Risk  23.4   11.0        Use the calculated Patient Ratio above and the  CHD Risk Table to determine the patient's CHD Risk.        ATP III CLASSIFICATION (LDL):  <100     mg/dL   Optimal  528-413  mg/dL   Near or Above                    Optimal  130-159  mg/dL   Borderline  244-010  mg/dL   High  >272     mg/dL   Very High Performed at [facility], [address] [place], [place] [address]   TSH     Status: None   Collection Time: 02/12/18  7:29 AM  Result Value Ref Range   TSH 2.983 0.350 - 4.500 uIU/mL    Comment: Performed by a 3rd Generation assay with a functional sensitivity of <=0.01 uIU/mL. Performed at [facility], [address] [place], [place] [address]   Acetaminophen level     Status: Abnormal   Collection Time: 02/12/18  7:29 AM  Result Value Ref Range   Acetaminophen (Tylenol), Serum <10 (L) 10 - 30 ug/mL    Comment: (NOTE) Therapeutic concentrations vary significantly. A range of 10-30 ug/mL  may be an effective concentration for many patients. However, some  are best treated at concentrations outside of this range. Acetaminophen concentrations >150 ug/mL at 4 hours after ingestion  and >50 ug/mL at 12 hours after ingestion are often associated with  toxic reactions. Performed at [facility], [address] [place], [place] [address]   Salicylate level     Status: None   Collection Time: 02/12/18  7:29 AM  Result Value Ref Range   Salicylate Lvl <7.0 2.8 - 30.0 mg/dL    Comment: Performed at [facility], [address] [place], [place] [address]    Blood Alcohol level:  Lab Results  Component Value Date   [facility] <10 02/11/2018    Metabolic Disorder Labs: Lab Results  Component Value Date   HGBA1C 5.4 02/12/2018   MPG 108 02/12/2018   No results found for: PROLACTIN Lab Results  Component Value Date   CHOL 170 02/12/2018   TRIG 63 02/12/2018   HDL 46 02/12/2018   CHOLHDL 3.7 02/12/2018   VLDL 13 02/12/2018   LDLCALC 111 (H) 02/12/2018    Physical Findings: AIMS:  Facial and Oral Movements Muscles of Facial Expression: None, normal Lips and Perioral Area: None, normal Jaw: None, normal Tongue: None, normal,Extremity Movements Upper (arms, wrists, hands, fingers): None, normal Lower (legs, knees, ankles, toes): None, normal, Trunk Movements Neck, shoulders, hips: None, normal, Overall Severity Severity of abnormal movements (highest score from questions above): None, normal Incapacitation due to abnormal movements: None, normal Patient's awareness of abnormal movements (rate only patient's report): No Awareness, Dental Status Current problems with teeth and/or dentures?: No Does patient usually wear dentures?: No  CIWA:  CIWA-Ar Total: 3 COWS:  COWS Total Score: 1  Musculoskeletal: Strength & Muscle Tone: within normal limits Gait & Station: normal Patient leans: N/A  Psychiatric Specialty Exam: Physical Exam  Nursing note and vitals reviewed. Psychiatric: Her affect is angry, labile and inappropriate. Her speech is rapid and/or pressured. She is actively hallucinating. Thought content is paranoid and delusional. Cognition and memory are impaired. She expresses impulsivity.  Review of Systems  Neurological: Negative.   Psychiatric/Behavioral: Positive for hallucinations.  All other systems reviewed and are negative.   Blood pressure 103/86, pulse 100, temperature 98.7 F (37.1 C), temperature source Oral, resp. rate 18, height 5\' 5"  (1.651 m), weight 55.3 kg (122 lb), last menstrual period 02/10/2018, SpO2 100 %.Body mass index is 20.3 kg/m.  General Appearance: Disheveled  Eye Contact:  Good  Speech:  Pressured  Volume:  Increased  Mood:  Angry, Dysphoric and Irritable  Affect:  Congruent  Thought Process:  Disorganized, Irrelevant and Descriptions of Associations: Loose  Orientation:  Full (Time, Place, and Person)  Thought Content:  Delusions, Hallucinations: Auditory and Paranoid Ideation  Suicidal Thoughts:  No  Homicidal  Thoughts:  No  Memory:  Immediate;   Poor Recent;   Poor Remote;   Poor  Judgement:  Poor  Insight:  Lacking  Psychomotor Activity:  Increased  Concentration:  Concentration: Poor and Attention Span: Poor  Recall:  Poor  Fund of Knowledge:  Poor  Language:  Poor  Akathisia:  No  Handed:  Right  AIMS (if indicated):     Assets:  Communication Skills Desire for Improvement Physical Health Resilience  ADL's:  Intact  Cognition:  WNL  Sleep:  Number of Hours: 7.3     Treatment Plan Summary: Daily contact with patient to assess and evaluate symptoms and progress in treatment and Medication management   Ms. Walling is a 32 year old female with unknown psychiatric history admitted for psychotic break. She is floridly manic with agitation, lous speech and profanities.   #Agitation -Haldol 10 mg, Ativan2 mg and Benadryl 50 mg PRN  #Mood and psychosis -increase Seroquel to 600 mg nightly, she agreed to take Seroquel -start Depakote 500 mg TID  #Insomnia -Trazodone 100 mg nightly  #UTI -cx pending -Keflex 500 mg TID  #Substance abuse -positive for cannabis -unable to discuss substance abuse treatment  #Smoking cessation -nicotine patch is available  #B12 defficiency -Vit B12 1000 ug daily  #Weight loss -MVI -Ensure TID  #Labs -lipid panel, TSH and A1C are normal -EKG reviewed, NSR with QTc 423 -head CT scan is negative -pregnancy test negative  #Disposition -discharge with family -follow up with [facility]    Kristine Linea, MD 02/13/2018, 10:10 AM

## 2018-02-12 NOTE — Progress Notes (Signed)
Admission Note:  89yr female who presents IVC in no acute distress for the treatment of Psychosis and Mania. Pt appears flat and sad, looks disheveled, poor hygiene and body odor. Pt was irritable and sometimes uncooperative with admission process. Patient's thoughts are disorganized and incoherent, she currently denies SI/HI/AVH but noted responding to internal stimuli. Per report patient was IVC'D by mother, she was noted to have experienced weight loss and hair falling out , in addition acting bizarre and agitated responding to internal stimuli. Patient appears also delusional  She stated " I am pregnant" test result indicate otherwise. Pt has Past medical Hx of Bipolar Disorder, and Substance Abuse. Patient's skin was assessed in presence of Tameka RN and found to be clear of any abnormal marks, also searched and no contraband found, POC and unit policies explained and understanding verbalized. consents obtained, food and fluids offered, and both  accepted. 15 minutes safety checks maintained will continue to monitor.

## 2018-02-12 NOTE — Plan of Care (Signed)
  Problem: Activity: Goal: Will verbalize the importance of balancing activity with adequate rest periods Outcome: Progressing  Patient in bed expressed the need for rest with activity.

## 2018-02-12 NOTE — Progress Notes (Signed)
D- Patient alert and oriented. Patient presents in a pleasant mood on assessment stating that she slept "good" last night with no major complaints at this time. Patient denies SI, HI, AVH, and pain at this time. Patient also denies any depression/anxiety stating to this writer "ain't nothing wrong with me, I'm just pregnant". Patient's goal for today is to "get up out of here".  A- Scheduled medications administered to patient, per MD orders. Support and encouragement provided.  Routine safety checks conducted every 15 minutes.  Patient informed to notify staff with problems or concerns.  R- No adverse drug reactions noted. Patient contracts for safety at this time. Patient compliant with medications and treatment plan. Patient receptive, calm, and cooperative. Patient interacts well with others on the unit.  Patient remains safe at this time.

## 2018-02-12 NOTE — Plan of Care (Signed)
Patient verbalizes understanding of the information that's been provided to her and all questions/concerns have been addressed and answered at this time. Patient denies SI/HI/AVH as well as any signs/symptoms of depression/anxiety, however, she does appear to be responding to internal stimuli. Patient has been resting on and off throughout the day, but has been out in the milieu for meals and she has been working towards maintaining adequate nutrition. Patient verbalizes understanding of and has been in compliance with her prescribed therapeutic regimen thus far and she has not voiced any further question/concerns at this time.  Patient's goal for today is to "get up out of here". Patient has the ability to redirect her anger into socially appropriate behaviors. Patient has remained free from injury and has not participated in any self-care today. Patient remains safe on the unit at this time.  Problem: Education: Goal: Knowledge of [facility] Education information/materials will improve Outcome: Progressing Goal: Emotional status will improve Outcome: Progressing Goal: Mental status will improve Outcome: Progressing Goal: Verbalization of understanding the information provided will improve Outcome: Progressing   Problem: Activity: Goal: Will verbalize the importance of balancing activity with adequate rest periods Outcome: Progressing   Problem: Education: Goal: Will be free of psychotic symptoms Outcome: Progressing Goal: Knowledge of the prescribed therapeutic regimen will improve Outcome: Progressing   Problem: Coping: Goal: Coping ability will improve Outcome: Progressing Goal: Will verbalize feelings Outcome: Progressing   Problem: Health Behavior/Discharge Planning: Goal: Compliance with prescribed medication regimen will improve Outcome: Progressing   Problem: Nutritional: Goal: Ability to achieve adequate nutritional intake will improve Outcome: Progressing    Problem: Role Relationship: Goal: Ability to communicate needs accurately will improve Outcome: Progressing Goal: Ability to interact with others will improve Outcome: Progressing   Problem: Safety: Goal: Ability to redirect hostility and anger into socially appropriate behaviors will improve Outcome: Progressing Goal: Ability to remain free from injury will improve Outcome: Progressing   Problem: Self-Care: Goal: Ability to participate in self-care as condition permits will improve Outcome: Progressing   Problem: Self-Concept: Goal: Will verbalize positive feelings about self Outcome: Progressing

## 2018-02-12 NOTE — Tx Team (Signed)
Initial Treatment Plan 02/12/2018 5:44 AM [name] WNU:[id number]    PATIENT STRESSORS: Medication change or noncompliance Substance abuse   PATIENT STRENGTHS: Motivation for treatment/growth Supportive family/friends   PATIENT IDENTIFIED PROBLEMS: Psychosis " I am pregnant"     Bipolar                 DISCHARGE CRITERIA:  Improved stabilization in mood, thinking, and/or behavior Motivation to continue treatment in a less acute level of carepsychosis  PRELIMINARY DISCHARGE PLAN: Outpatient therapy Participate in family therapy  PATIENT/FAMILY INVOLVEMENT: This treatment plan has been presented to and reviewed with the patient, Jenna Bell, The patient and family have been given the opportunity to ask questions and make suggestions.  [name], RN 02/12/2018, 5:44 AM

## 2018-02-13 LAB — URINE CULTURE

## 2018-02-13 LAB — HEMOGLOBIN A1C
Hgb A1c MFr Bld: 5.4 % (ref 4.8–5.6)
Mean Plasma Glucose: 108 mg/dL

## 2018-02-13 MED ORDER — DIVALPROEX SODIUM 500 MG PO DR TAB
500.0000 mg | DELAYED_RELEASE_TABLET | Freq: Three times a day (TID) | ORAL | Status: DC
  Administered 2018-02-13 – 2018-02-17 (×11): 500 mg via ORAL
  Filled 2018-02-13 (×11): qty 1

## 2018-02-13 MED ORDER — QUETIAPINE FUMARATE 200 MG PO TABS
600.0000 mg | ORAL_TABLET | Freq: Every day | ORAL | Status: DC
  Administered 2018-02-13 – 2018-03-02 (×18): 600 mg via ORAL
  Filled 2018-02-13: qty 6
  Filled 2018-02-13 (×17): qty 3

## 2018-02-13 NOTE — [facility] Group Notes (Signed)
LCSW Group Therapy Note 02/13/2018 9:00 AM  Type of Therapy and Topic:  Group Therapy:  Setting Goals  Participation Level:  Did Not Attend  Description of Group: In this process group, patients discussed using strengths to work toward goals and address challenges.  Patients identified two positive things about themselves and one goal they were working on.  Patients were given the opportunity to share openly and support each other's plan for self-empowerment.  The group discussed the value of gratitude and were encouraged to have a daily reflection of positive characteristics or circumstances.  Patients were encouraged to identify a plan to utilize their strengths to work on current challenges and goals.  Therapeutic Goals 1. Patient will verbalize personal strengths/positive qualities and relate how these can assist with achieving desired personal goals 2. Patients will verbalize affirmation of peers plans for personal change and goal setting 3. Patients will explore the value of gratitude and positive focus as related to successful achievement of goals 4. Patients will verbalize a plan for regular reinforcement of personal positive qualities and circumstances.  Summary of Patient Progress:   Glee ArvinLatoya was invited to today's group, but chose not to attend.    Therapeutic Modalities Cognitive Behavioral Therapy Motivational Interviewing    Alease FrameSonya S Denis [place], [place] 02/13/2018 2:17 PM

## 2018-02-13 NOTE — Progress Notes (Signed)
[facility] MD Progress Note  02/14/2018 2:10 PM Jenna Bell  MRN:  [id number]  Subjective:    Ms. Rance refuses to talk to me and dysinvites me to her room. She is less agitated. She accepts medication as prescribed. She has 3 lawsuits agains me going.   Principal Problem: Bipolar I disorder, current or most recent episode manic, with psychotic features (HCC) Diagnosis:   Patient Active Problem List   Diagnosis Date Noted  . Bipolar I disorder, current or most recent episode manic, with psychotic features (HCC) [F31.2] 02/11/2018    Priority: High  . Tobacco use disorder [F17.200] 02/12/2018  . Cannabis use disorder, moderate, dependence (HCC) [F12.20] 02/12/2018  . UTI (urinary tract infection) [N39.0] 02/12/2018   Total Time spent with patient: 20 minutes  Past Psychiatric History: unknown  Past Medical History:  Past Medical History:  Diagnosis Date  . Bipolar disorder (HCC)    History reviewed. No pertinent surgical history. Family History: History reviewed. No pertinent family history. Family Psychiatric  History: unknown Social History:  Social History   Substance and Sexual Activity  Alcohol Use Yes  . Alcohol/week: 1.2 oz  . Types: 2 Glasses of wine per week  . Frequency: Never     Social History   Substance and Sexual Activity  Drug Use Yes  . Types: Methamphetamines    Social History   Socioeconomic History  . Marital status: Single    Spouse name: Not on file  . Number of children: Not on file  . Years of education: Not on file  . Highest education level: Not on file  Occupational History  . Not on file  Social Needs  . Financial resource strain: Not on file  . Food insecurity:    Worry: Not on file    Inability: Not on file  . Transportation needs:    Medical: Not on file    Non-medical: Not on file  Tobacco Use  . Smoking status: Light Tobacco Smoker    Packs/day: 0.25  . Smokeless tobacco: Never Used  Substance and Sexual Activity  .  Alcohol use: Yes    Alcohol/week: 1.2 oz    Types: 2 Glasses of wine per week    Frequency: Never  . Drug use: Yes    Types: Methamphetamines  . Sexual activity: Yes  Lifestyle  . Physical activity:    Days per week: Not on file    Minutes per session: Not on file  . Stress: Not on file  Relationships  . Social connections:    Talks on phone: Not on file    Gets together: Not on file    Attends religious service: Not on file    Active member of club or organization: Not on file    Attends meetings of clubs or organizations: Not on file    Relationship status: Not on file  Other Topics Concern  . Not on file  Social History Narrative  . Not on file   Additional Social History:                         Sleep: Fair  Appetite:  Poor  Current Medications: Current Facility-Administered Medications  Medication Dose Route Frequency Provider Last Rate Last Dose  . alum & mag hydroxide-simeth (MAALOX/MYLANTA) 200-200-20 MG/5ML suspension 30 mL  30 mL Oral Q4H PRN Clapacs, John T, MD      . cephALEXin (KEFLEX) capsule 500 mg  500 mg Oral Q8H  Clapacs, Jackquline DenmarkJohn T, MD   500 mg at 02/14/18 0558  . diphenhydrAMINE (BENADRYL) capsule 50 mg  50 mg Oral Q6H PRN Azeez Dunker B, MD       Or  . diphenhydrAMINE (BENADRYL) injection 50 mg  50 mg Intramuscular Q6H PRN Brionne Mertz B, MD   50 mg at 02/13/18 1009  . divalproex (DEPAKOTE) DR tablet 500 mg  500 mg Oral Q8H [place] Depaz B, MD   500 mg at 02/14/18 0558  . feeding supplement (ENSURE ENLIVE) (ENSURE ENLIVE) liquid 237 mL  237 mL Oral TID BM Michaelanthony Kempton B, MD   237 mL at 02/14/18 1015  . haloperidol (HALDOL) tablet 10 mg  10 mg Oral Q6H PRN Sheddrick Lattanzio B, MD       Or  . haloperidol lactate (HALDOL) injection 10 mg  10 mg Intramuscular Q6H PRN Vaniya Augspurger B, MD   10 mg at 02/13/18 1009  . hydrOXYzine (ATARAX/VISTARIL) tablet 50 mg  50 mg Oral TID PRN Clapacs, John T, MD      . LORazepam  (ATIVAN) tablet 2 mg  2 mg Oral Q4H PRN Larhonda Dettloff B, MD   2 mg at 02/13/18 2119   Or  . LORazepam (ATIVAN) injection 2 mg  2 mg Intramuscular Q4H PRN Devory Mckinzie B, MD   2 mg at 02/13/18 1005  . magnesium hydroxide (MILK OF MAGNESIA) suspension 30 mL  30 mL Oral Daily PRN Clapacs, John T, MD      . multivitamin with minerals tablet 1 tablet  1 tablet Oral Daily Karlissa Aron B, MD   1 tablet at 02/14/18 0809  . QUEtiapine (SEROQUEL) tablet 600 mg  600 mg Oral QHS Aidee Latimore B, MD   600 mg at 02/13/18 2124  . vitamin B-12 (CYANOCOBALAMIN) tablet 1,000 mcg  1,000 mcg Oral Daily Yanisa Goodgame B, MD   1,000 mcg at 02/14/18 0809    Lab Results:  No results found for this or any previous visit (from the past 48 hour(s)).  Blood Alcohol level:  Lab Results  Component Value Date   ETH <10 02/11/2018    Metabolic Disorder Labs: Lab Results  Component Value Date   HGBA1C 5.4 02/12/2018   MPG 108 02/12/2018   No results found for: PROLACTIN Lab Results  Component Value Date   CHOL 170 02/12/2018   TRIG 63 02/12/2018   HDL 46 02/12/2018   CHOLHDL 3.7 02/12/2018   VLDL 13 02/12/2018   LDLCALC 111 (H) 02/12/2018    Physical Findings: AIMS: Facial and Oral Movements Muscles of Facial Expression: None, normal Lips and Perioral Area: None, normal Jaw: None, normal Tongue: None, normal,Extremity Movements Upper (arms, wrists, hands, fingers): None, normal Lower (legs, knees, ankles, toes): None, normal, Trunk Movements Neck, shoulders, hips: None, normal, Overall Severity Severity of abnormal movements (highest score from questions above): None, normal Incapacitation due to abnormal movements: None, normal Patient's awareness of abnormal movements (rate only patient's report): No Awareness, Dental Status Current problems with teeth and/or dentures?: No Does patient usually wear dentures?: No  CIWA:  CIWA-Ar Total: 3 COWS:  COWS Total Score:  1  Musculoskeletal: Strength & Muscle Tone: within normal limits Gait & Station: normal Patient leans: N/A  Psychiatric Specialty Exam: Physical Exam  Nursing note and vitals reviewed. Psychiatric: Her affect is angry, labile and inappropriate. Her speech is rapid and/or pressured. She is hyperactive and actively hallucinating. Thought content is delusional. Cognition and memory are impaired. She expresses impulsivity.  Review of Systems  Neurological: Negative.   Psychiatric/Behavioral: Positive for substance abuse and suicidal ideas.  All other systems reviewed and are negative.   Blood pressure 103/86, pulse 100, temperature 98.7 F (37.1 C), temperature source Oral, resp. rate 18, height 5\' 5"  (1.651 m), weight 55.3 kg (122 lb), last menstrual period 02/10/2018, SpO2 100 %.Body mass index is 20.3 kg/m.  General Appearance: Disheveled  Eye Contact:  Good  Speech:  Pressured  Volume:  Increased  Mood:  Angry, Dysphoric and Irritable  Affect:  Congruent  Thought Process:  Disorganized, Irrelevant and Descriptions of Associations: Loose  Orientation:  Full (Time, Place, and Person)  Thought Content:  Illogical, Delusions and Hallucinations: Auditory  Suicidal Thoughts:  No  Homicidal Thoughts:  No  Memory:  Immediate;   Poor Recent;   Poor Remote;   Poor  Judgement:  Poor  Insight:  Lacking  Psychomotor Activity:  Increased  Concentration:  Concentration: Poor and Attention Span: Poor  Recall:  Poor  Fund of Knowledge:  Poor  Language:  Poor  Akathisia:  No  Handed:  Right  AIMS (if indicated):     Assets:  Communication Skills Desire for Improvement Physical Health Resilience Social Support  ADL's:  Intact  Cognition:  WNL  Sleep:  Number of Hours: 8.15     Treatment Plan Summary: Daily contact with patient to assess and evaluate symptoms and progress in treatment and Medication management   Ms. Charo is a 32 year old female with unknown psychiatric  history admitted for psychotic break. She is floridly manic with agitation that requires PRNs. She is loud and profane.    #Agitation -Haldol 10 mg, Ativan2 mg and Benadryl 50 mg PRN  #Mood and psychosis -continue Seroquel to 600 mg nightly, she agreed to take Seroquel -continue Depakote 500 mg TID  #Insomnia -Trazodone 100 mg nightly  #UTI -Keflex 500 mg TID  #Substance abuse -positive for cannabis -unable to discuss substance abuse treatment  #Smoking cessation -nicotine patch is available  #B12 defficiency -Vit B12 1000 ug daily  #Weight loss -MVI -Ensure TID  #Labs -lipid panel, TSH and A1C are normal -EKG reviewed, NSR with QTc 423 -head CT scan is negative -pregnancy test negative  #Disposition -discharge with family -follow up with [facility]     Kristine Linea, MD 02/14/2018, 2:10 PM

## 2018-02-13 NOTE — Progress Notes (Signed)
D- Patient alert and oriented. Patient presents in pleasant mood on assessment stating that she slept "good" and had no major complaints at this time. Patient denies SI, HI, AVH, and pain at this time. Patient also denies any depression/anxiety at this time. Patient became agitated/irritable after speaking with this writer preoccupied with discharge stating " I got to get the fuck up out of here, y'all can't hold me. I have three lawsuits and I'm calling my lawyers". Patient's goal for today is to "get up out of here".   A- Scheduled medications administered to patient, per MD orders. Support and encouragement provided.  Routine safety checks conducted every 15 minutes.  Patient informed to notify staff with problems or concerns.  R- No adverse drug reactions noted. Patient contracts for safety at this time. Patient compliant with medications and treatment plan. Patient receptive, calm, and cooperative. Patient interacts well with others on the unit.  Patient remains safe at this time.

## 2018-02-13 NOTE — Plan of Care (Signed)
Heard talking loudly to herself prior to prompt at the door; psychotic, responding to internal stimuli, accepted Ensure and quickly drank it; vocabulary rich in profanity, poorly kept appearance, has a pimple in the left upper eye browse, talked about her pregnancy (pseudocyesis - delusions of pregnancy); "where have you been, Leonette Mostharles don't want anything to do with this baby, I am on my own...that's what girls do these days" Complied with medications including antibiotics as ordered for UTI.  Patient slept for Estimated Hours of 7.30; Precautionary checks every 15 minutes for safety maintained, room free of safety hazards, patient sustains no injury or falls during this shift.  Problem: Health Behavior/Discharge Planning: Goal: Compliance with prescribed medication regimen will improve Outcome: Progressing   Problem: Nutritional: Goal: Ability to achieve adequate nutritional intake will improve Outcome: Progressing   Problem: Role Relationship: Goal: Ability to interact with others will improve Outcome: Progressing   Problem: Safety: Goal: Ability to remain free from injury will improve Outcome: Progressing   Problem: Education: Goal: Emotional status will improve Outcome: Not Progressing Goal: Mental status will improve Outcome: Not Progressing Goal: Verbalization of understanding the information provided will improve Outcome: Not Progressing   Problem: Education: Goal: Will be free of psychotic symptoms Outcome: Not Progressing   Problem: Safety: Goal: Ability to redirect hostility and anger into socially appropriate behaviors will improve Outcome: Not Progressing

## 2018-02-13 NOTE — Plan of Care (Signed)
Patient verbalizes understanding of the information that's been provided to her and all questions/concerns have been addressed and answered at this time. Patient denies SI/HI/AVH as well as any signs/symptoms of depression/anxiety, however, she does appear to be responding to internal stimuli by having loud conversations with herself. Patient became agitated/irritable shortly after being assessed by this writer and was medicated and has been resting on and off throughout the day.  Patient has been out in the milieu for meals and she has been working towards maintaining adequate nutrition. Patient verbalizes understanding of and has been in compliance with her prescribed therapeutic regimen thus far and she has not voiced any further question/concerns at this time. Patient's goal for today is to "get up out of here". Patient has the ability to redirect her anger into socially appropriate behaviors. Patient has remained free from injury and has not participated in any self-care today. Patient remains safe on the unit at this time.  Problem: Education: Goal: Knowledge of [facility] Education information/materials will improve Outcome: Progressing Goal: Emotional status will improve Outcome: Progressing Goal: Mental status will improve Outcome: Progressing Goal: Verbalization of understanding the information provided will improve Outcome: Progressing   Problem: Activity: Goal: Will verbalize the importance of balancing activity with adequate rest periods Outcome: Progressing   Problem: Education: Goal: Will be free of psychotic symptoms Outcome: Progressing Goal: Knowledge of the prescribed therapeutic regimen will improve Outcome: Progressing   Problem: Coping: Goal: Coping ability will improve Outcome: Progressing Goal: Will verbalize feelings Outcome: Progressing   Problem: Health Behavior/Discharge Planning: Goal: Compliance with prescribed medication regimen will  improve Outcome: Progressing   Problem: Nutritional: Goal: Ability to achieve adequate nutritional intake will improve Outcome: Progressing   Problem: Role Relationship: Goal: Ability to communicate needs accurately will improve Outcome: Progressing Goal: Ability to interact with others will improve Outcome: Progressing   Problem: Safety: Goal: Ability to redirect hostility and anger into socially appropriate behaviors will improve Outcome: Progressing Goal: Ability to remain free from injury will improve Outcome: Progressing   Problem: Self-Care: Goal: Ability to participate in self-care as condition permits will improve Outcome: Progressing   Problem: Self-Concept: Goal: Will verbalize positive feelings about self Outcome: Progressing

## 2018-02-13 NOTE — Progress Notes (Signed)
Patient has not received her afternoon medication because she received PRN medication for agitation and has been extremely drowsy.

## 2018-02-13 NOTE — Progress Notes (Signed)
Recreation Therapy Notes  Date: 02/13/2018  Time: 3:00pm  Location: Craft room  Behavioral response: N/A  Group Type: Game  Participation level: N/A  Communication: Patient did not attend group.  Comments: N/A  Reymundo Winship LRT/CTRS        Yarima Penman 02/13/2018 3:39 PM

## 2018-02-13 NOTE — B[facility] Group Notes (Signed)
  02/13/2018  Time: 1PM  Type of Therapy/Topic:  Group Therapy:  Balance in Life  Participation Level:  Did Not Attend  Description of Group:   This group will address the concept of balance and how it feels and looks when one is unbalanced. Patients will be encouraged to process areas in their lives that are out of balance and identify reasons for remaining unbalanced. Facilitators will guide patients in utilizing problem-solving interventions to address and correct the stressor making their life unbalanced. Understanding and applying boundaries will be explored and addressed for obtaining and maintaining a balanced life. Patients will be encouraged to explore ways to assertively make their unbalanced needs known to significant others in their lives, using other group members and facilitator for support and feedback.  Therapeutic Goals: 1. Patient will identify two or more emotions or situations they have that consume much of in their lives. 2. Patient will identify signs/triggers that life has become out of balance:  3. Patient will identify two ways to set boundaries in order to achieve balance in their lives:  4. Patient will demonstrate ability to communicate their needs through discussion and/or role plays  Summary of Patient Progress: Pt was invited to attend group but chose not to attend. CSW will continue to encourage pt to attend group throughout their admission.    Therapeutic Modalities:   Cognitive Behavioral Therapy Solution-Focused Therapy Assertiveness Training  Jaimi Belle, MSW, LCSW Clinical Social Worker 02/13/2018 2:51 PM   

## 2018-02-13 NOTE — Progress Notes (Signed)
Recreation Therapy Notes   Date: 02/13/2018  Time: 9:30 am   Location: Craft Room   Behavioral response: N/A   Intervention Topic:  Anger Management   Discussion/Intervention: Patient did not attend group.   Clinical Observations/Feedback:  Patient did not attend group.   Kieara Schwark LRT/CTRS        Gurshan Settlemire 02/13/2018 10:43 AM

## 2018-02-14 NOTE — Plan of Care (Signed)
Patient is very irritable today. Alert and oriented to self and place. Patient states that she wants to go home. Patient observed walking down the hall with a brown paper bag and stated when I asked her where she was going, she replied, "I'm going home." It was explained to the patient that she could not leave the unit until she was discharged by the doctor. Patient became irate and started cursing at this writer, "Well do you who the hell can let me go, since you know so damn much." Compliant with medication with meals and medication. Milieu remains safe with q 15 minute safety checks.

## 2018-02-14 NOTE — Progress Notes (Signed)
Recreation Therapy Notes  Date: 02/14/2018  Time: 9:30 am   Location: Craft Room   Behavioral response: N/A   Intervention Topic:  Team Work  Discussion/Intervention: Patient did not attend group.   Clinical Observations/Feedback:  Patient did not attend group.   Zanovia Rotz LRT/CTRS        Osei Anger 02/14/2018 10:25 AM 

## 2018-02-14 NOTE — Progress Notes (Addendum)
[facility] MD Progress Note  02/15/2018 4:15 PM Jenna Bell  MRN:  [id number]  Subjective:    Jenna Bell is loud, pressured and vulgar but somewhat more friendly. She allows me in the room and ia able to hold a conversation. She is unreasonable, illogical, paranoid. She takes medications as prescribed. She denies any symptoms/problems.  Called her mother, Jenna Bell [phone] but no mailbox established.  Principal Problem: Bipolar I disorder, current or most recent episode manic, with psychotic features (HCC) Diagnosis:   Patient Active Problem List   Diagnosis Date Noted  . Bipolar I disorder, current or most recent episode manic, with psychotic features (HCC) [F31.2] 02/11/2018    Priority: High  . Tobacco use disorder [F17.200] 02/12/2018  . Cannabis use disorder, moderate, dependence (HCC) [F12.20] 02/12/2018  . UTI (urinary tract infection) [N39.0] 02/12/2018   Total Time spent with patient: 20 minutes  Past Psychiatric History: unknown  Past Medical History:  Past Medical History:  Diagnosis Date  . Bipolar disorder (HCC)    History reviewed. No pertinent surgical history. Family History: History reviewed. No pertinent family history. Family Psychiatric  History: unknown Social History:  Social History   Substance and Sexual Activity  Alcohol Use Yes  . Alcohol/week: 1.2 oz  . Types: 2 Glasses of wine per week  . Frequency: Never     Social History   Substance and Sexual Activity  Drug Use Yes  . Types: Methamphetamines    Social History   Socioeconomic History  . Marital status: Single    Spouse name: Not on file  . Number of children: Not on file  . Years of education: Not on file  . Highest education level: Not on file  Occupational History  . Not on file  Social Needs  . Financial resource strain: Not on file  . Food insecurity:    Worry: Not on file    Inability: Not on file  . Transportation needs:    Medical: Not on file    Non-medical: Not  on file  Tobacco Use  . Smoking status: Light Tobacco Smoker    Packs/day: 0.25  . Smokeless tobacco: Never Used  Substance and Sexual Activity  . Alcohol use: Yes    Alcohol/week: 1.2 oz    Types: 2 Glasses of wine per week    Frequency: Never  . Drug use: Yes    Types: Methamphetamines  . Sexual activity: Yes  Lifestyle  . Physical activity:    Days per week: Not on file    Minutes per session: Not on file  . Stress: Not on file  Relationships  . Social connections:    Talks on phone: Not on file    Gets together: Not on file    Attends religious service: Not on file    Active member of club or organization: Not on file    Attends meetings of clubs or organizations: Not on file    Relationship status: Not on file  Other Topics Concern  . Not on file  Social History Narrative  . Not on file   Additional Social History:                         Sleep: Fair  Appetite:  Fair  Current Medications: Current Facility-Administered Medications  Medication Dose Route Frequency Provider Last Rate Last Dose  . alum & mag hydroxide-simeth (MAALOX/MYLANTA) 200-200-20 MG/5ML suspension 30 mL  30 mL Oral Q4H PRN Clapacs, John  T, MD      . cephALEXin (KEFLEX) capsule 500 mg  500 mg Oral Q8H Clapacs, John T, MD   500 mg at 02/15/18 1408  . diphenhydrAMINE (BENADRYL) capsule 50 mg  50 mg Oral Q6H PRN Raeshaun Simson B, MD       Or  . diphenhydrAMINE (BENADRYL) injection 50 mg  50 mg Intramuscular Q6H PRN Tamber Burtch B, MD   50 mg at 02/13/18 1009  . divalproex (DEPAKOTE) DR tablet 500 mg  500 mg Oral Q8H Champion Corales B, MD   500 mg at 02/15/18 1408  . feeding supplement (ENSURE ENLIVE) (ENSURE ENLIVE) liquid 237 mL  237 mL Oral TID BM Mariha Sleeper B, MD   237 mL at 02/14/18 2130  . haloperidol (HALDOL) tablet 10 mg  10 mg Oral Q6H PRN Appollonia Klee B, MD       Or  . haloperidol lactate (HALDOL) injection 10 mg  10 mg Intramuscular Q6H PRN  Alisyn Lequire B, MD   10 mg at 02/13/18 1009  . hydrOXYzine (ATARAX/VISTARIL) tablet 50 mg  50 mg Oral TID PRN Clapacs, John T, MD      . LORazepam (ATIVAN) tablet 2 mg  2 mg Oral Q4H PRN Haston Casebolt B, MD   2 mg at 02/13/18 2119   Or  . LORazepam (ATIVAN) injection 2 mg  2 mg Intramuscular Q4H PRN Quadre Bristol B, MD   2 mg at 02/13/18 1005  . magnesium hydroxide (MILK OF MAGNESIA) suspension 30 mL  30 mL Oral Daily PRN Clapacs, John T, MD      . multivitamin with minerals tablet 1 tablet  1 tablet Oral Daily Thi Klich B, MD   1 tablet at 02/15/18 0906  . QUEtiapine (SEROQUEL) tablet 600 mg  600 mg Oral QHS Michie Molnar B, MD   600 mg at 02/14/18 2131  . vitamin B-12 (CYANOCOBALAMIN) tablet 1,000 mcg  1,000 mcg Oral Daily Starlynn Klinkner B, MD   1,000 mcg at 02/15/18 16100906    Lab Results: No results found for this or any previous visit (from the past 48 hour(s)).  Blood Alcohol level:  Lab Results  Component Value Date   ETH <10 02/11/2018    Metabolic Disorder Labs: Lab Results  Component Value Date   HGBA1C 5.4 02/12/2018   MPG 108 02/12/2018   No results found for: PROLACTIN Lab Results  Component Value Date   CHOL 170 02/12/2018   TRIG 63 02/12/2018   HDL 46 02/12/2018   CHOLHDL 3.7 02/12/2018   VLDL 13 02/12/2018   LDLCALC 111 (H) 02/12/2018    Physical Findings: AIMS: Facial and Oral Movements Muscles of Facial Expression: None, normal Lips and Perioral Area: None, normal Jaw: None, normal Tongue: None, normal,Extremity Movements Upper (arms, wrists, hands, fingers): None, normal Lower (legs, knees, ankles, toes): None, normal, Trunk Movements Neck, shoulders, hips: None, normal, Overall Severity Severity of abnormal movements (highest score from questions above): None, normal Incapacitation due to abnormal movements: None, normal Patient's awareness of abnormal movements (rate only patient's report): No Awareness, Dental  Status Current problems with teeth and/or dentures?: No Does patient usually wear dentures?: No  CIWA:  CIWA-Ar Total: 3 COWS:  COWS Total Score: 1  Musculoskeletal: Strength & Muscle Tone: within normal limits Gait & Station: normal Patient leans: N/A  Psychiatric Specialty Exam: Physical Exam  Nursing note and vitals reviewed. Psychiatric: Her affect is angry, labile and inappropriate. Her speech is rapid and/or pressured. She is  actively hallucinating. Thought content is paranoid and delusional. Cognition and memory are impaired. She expresses impulsivity.    Review of Systems  Neurological: Negative.   Psychiatric/Behavioral: Positive for hallucinations and substance abuse.  All other systems reviewed and are negative.   Blood pressure 105/70, pulse 79, temperature 97.8 F (36.6 C), resp. rate 18, height 5\' 5"  (1.651 m), weight 55.3 kg (122 lb), last menstrual period 02/10/2018, SpO2 100 %.Body mass index is 20.3 kg/m.  General Appearance: Disheveled  Eye Contact:  Minimal  Speech:  Pressured  Volume:  Increased  Mood:  Angry, Dysphoric and Irritable  Affect:  Congruent  Thought Process:  Disorganized, Irrelevant and Descriptions of Associations: Loose  Orientation:  Full (Time, Place, and Person)  Thought Content:  Illogical, Delusions, Hallucinations: Auditory and Obsessions  Suicidal Thoughts:  No  Homicidal Thoughts:  No  Memory:  Immediate;   Poor Recent;   Poor Remote;   Poor  Judgement:  Poor  Insight:  Lacking  Psychomotor Activity:  Increased  Concentration:  Concentration: Poor and Attention Span: Poor  Recall:  Poor  Fund of Knowledge:  Poor  Language:  Fair  Akathisia:  No  Handed:  Right  AIMS (if indicated):     Assets:  Communication Skills Desire for Improvement Housing Physical Health Resilience Social Support  ADL's:  Intact  Cognition:  WNL  Sleep:  Number of Hours: 7.5     Treatment Plan Summary: Daily contact with patient to assess  and evaluate symptoms and progress in treatment and Medication management   Ms. Novinger is a 32 year old female with unknown psychiatric history admitted for psychotic break.She is floridly manic with agitation that requires PRNs. She is loud and profane.   #Agitation -Haldol 10 mg, Ativan2 mg and Benadryl 50 mg PRN  #Mood and psychosis -continue Seroquel to600 mg nightly, she agreed to take Seroquel -continue Depakote 500 mg TID  #Insomnia -Trazodone 100 mg nightly  #UTI -Keflex 500 mg TID  #Substance abuse -positive for cannabis -unable to discuss substance abuse treatment  #Smoking cessation -nicotine patch is available  #B12 defficiency -Vit B12 1000 ug daily  #Weight loss -MVI -Ensure TID  #Labs -lipid panel, TSH and A1Care normal -EKGreviewed, NSR with QTc 423 -head CT scan is negative -pregnancy test negative  #Disposition -discharge with family -follow up with [facility]    Kristine Linea, MD 02/15/2018, 4:15 PM

## 2018-02-14 NOTE — Progress Notes (Signed)
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 When asked if she was ok, patient stated, "Yeah girl I'm good, just ready to get up out of this place." Remains isolative to her room, comes out for meals and medications." Milieu remains safe.

## 2018-02-14 NOTE — Plan of Care (Signed)
No temper outburst, slept most of the shift, disheveled still, poor hygiene, poorly kept oral hygiene.   Patient slept for Estimated Hours of 8.15; Precautionary checks every 15 minutes for safety maintained, room free of safety hazards, patient sustains no injury or falls during this shift.  Problem: Education: Goal: Emotional status will improve Outcome: Progressing   Problem: Nutritional: Goal: Ability to achieve adequate nutritional intake will improve Outcome: Progressing   Problem: Safety: Goal: Ability to remain free from injury will improve Outcome: Progressing   Problem: Education: Goal: Mental status will improve Outcome: Not Progressing Goal: Verbalization of understanding the information provided will improve Outcome: Not Progressing   Problem: Education: Goal: Will be free of psychotic symptoms Outcome: Not Progressing   Problem: Coping: Goal: Coping ability will improve Outcome: Not Progressing   Problem: Role Relationship: Goal: Ability to interact with others will improve Outcome: Not Progressing

## 2018-02-14 NOTE — [facility] Group Notes (Signed)
02/14/2018 1PM  Type of Therapy and Topic:  Group Therapy:  Feelings around Relapse and Recovery  Participation Level:  Active   Description of Group:    Patients in this group will discuss emotions they experience before and after a relapse. They will process how experiencing these feelings, or avoidance of experiencing them, relates to having a relapse. Facilitator will guide patients to explore emotions they have related to recovery. Patients will be encouraged to process which emotions are more powerful. They will be guided to discuss the emotional reaction significant others in their lives may have to patients' relapse or recovery. Patients will be assisted in exploring ways to respond to the emotions of others without this contributing to a relapse.  Therapeutic Goals: 1. Patient will identify two or more emotions that lead to a relapse for them 2. Patient will identify two emotions that result when they relapse 3. Patient will identify two emotions related to recovery 4. Patient will demonstrate ability to communicate their needs through discussion and/or role plays   Summary of Patient Progress: Pt. Came outside to practice self-care as a concept related to recovery.   Therapeutic Modalities:   Cognitive Behavioral Therapy Solution-Focused Therapy Assertiveness Training Relapse Prevention Therapy   Johny ShearsCassandra  Rylen Hou, LCSW 02/14/2018 1:58 PM

## 2018-02-15 NOTE — Progress Notes (Addendum)
[facility]  02/16/2018 7:56 AM Jenna [name] [name] Bell  MRN:  [id number]  Subjective:    [name] was agitated last night and received PRN medication to calm her down. She is only able to hold a very brief conversation before she becomes agitated, loud, profane, threatening. She takes medications as prescribed. Will check VPA level in am.   Principal Problem: Bipolar I disorder, current or most recent episode manic, with psychotic features [facility](HCC) Diagnosis:   Patient Active Problem List   Diagnosis Date Noted  . Bipolar I disorder, current or most recent episode manic, with psychotic features (HCC) [F31.2] 02/11/2018    Priority: High  . Tobacco use disorder [F17.200] 02/12/2018  . Cannabis use disorder, moderate, dependence (HCC) [F12.20] 02/12/2018  . UTI (urinary tract infection) [N39.0] 02/12/2018   Total Time spent with patient: 20 minutes  Past Psychiatric History: bipolar disorder  Past Medical History:  Past Medical History:  Diagnosis Date  . Bipolar disorder (HCC)    History reviewed. No pertinent surgical history. Family History: History reviewed. No pertinent family history. Family Psychiatric  History: none Social History:  Social History   Substance and Sexual Activity  Alcohol Use Yes  . Alcohol/week: 1.2 oz  . Types: 2 Glasses of wine per week  . Frequency: Never     Social History   Substance and Sexual Activity  Drug Use Yes  . Types: Methamphetamines    Social History   Socioeconomic History  . Marital status: Single    Spouse name: Not on file  . Number of children: Not on file  . Years of education: Not on file  . Highest education level: Not on file  Occupational History  . Not on file  Social Needs  . Financial resource strain: Not on file  . Food insecurity:    Worry: Not on file    Inability: Not on file  . Transportation needs:    Medical: Not on file    Non-medical: Not on file  Tobacco Use  . Smoking status: Light  Tobacco Smoker    Packs/day: 0.25  . Smokeless tobacco: Never Used  Substance and Sexual Activity  . Alcohol use: Yes    Alcohol/week: 1.2 oz    Types: 2 Glasses of wine per week    Frequency: Never  . Drug use: Yes    Types: Methamphetamines  . Sexual activity: Yes  Lifestyle  . Physical activity:    Days per week: Not on file    Minutes per session: Not on file  . Stress: Not on file  Relationships  . Social connections:    Talks on phone: Not on file    Gets together: Not on file    Attends religious service: Not on file    Active member of club or organization: Not on file    Attends meetings of clubs or organizations: Not on file    Relationship status: Not on file  Other Topics Concern  . Not on file  Social History Narrative  . Not on file   Additional Social History:                         Sleep: Fair  Appetite:  Fair  Current Medications: Current Facility-Administered Medications  Medication Dose Route Frequency Provider Last Rate Last Dose  . alum & mag hydroxide-simeth (MAALOX/MYLANTA) 200-200-20 MG/5ML suspension 30 mL  30 mL Oral Q4H PRN Clapacs, Jackquline DenmarkJohn T, MD      .  cephALEXin (KEFLEX) capsule 500 mg  500 mg Oral Q8H Clapacs, John T, MD   500 mg at 02/16/18 0639  . diphenhydrAMINE (BENADRYL) capsule 50 mg  50 mg Oral Q6H PRN Yesenia Locurto B, MD       Or  . diphenhydrAMINE (BENADRYL) injection 50 mg  50 mg Intramuscular Q6H PRN Tien Spooner B, MD   50 mg at 02/13/18 1009  . divalproex (DEPAKOTE) DR tablet 500 mg  500 mg Oral Q8H Ronen Bromwell B, MD   500 mg at 02/16/18 0639  . feeding supplement (ENSURE ENLIVE) (ENSURE ENLIVE) liquid 237 mL  237 mL Oral TID BM Sharnika Binney B, MD   237 mL at 02/14/18 2130  . haloperidol (HALDOL) tablet 10 mg  10 mg Oral Q6H PRN Briyonna Omara B, MD       Or  . haloperidol lactate (HALDOL) injection 10 mg  10 mg Intramuscular Q6H PRN Alexzia Kasler B, MD   10 mg at 02/13/18 1009   . hydrOXYzine (ATARAX/VISTARIL) tablet 50 mg  50 mg Oral TID PRN Clapacs, John T, MD      . LORazepam (ATIVAN) tablet 2 mg  2 mg Oral Q4H PRN Benoit Meech B, MD   2 mg at 02/13/18 2119   Or  . LORazepam (ATIVAN) injection 2 mg  2 mg Intramuscular Q4H PRN Markham Dumlao B, MD   2 mg at 02/13/18 1005  . magnesium hydroxide (MILK OF MAGNESIA) suspension 30 mL  30 mL Oral Daily PRN Clapacs, John T, MD      . multivitamin with minerals tablet 1 tablet  1 tablet Oral Daily Kaiyla Stahly B, MD   1 tablet at 02/15/18 0906  . QUEtiapine (SEROQUEL) tablet 600 mg  600 mg Oral QHS Glena Pharris B, MD   600 mg at 02/15/18 2048  . vitamin B-12 (CYANOCOBALAMIN) tablet 1,000 mcg  1,000 mcg Oral Daily Jacelyn Cuen B, MD   1,000 mcg at 02/15/18 1191    Lab Results: No results found for this or any previous visit (from the past 48 hour(s)).  Blood Alcohol level:  Lab Results  Component Value Date   ETH <10 02/11/2018    Metabolic Disorder Labs: Lab Results  Component Value Date   HGBA1C 5.4 02/12/2018   MPG 108 02/12/2018   No results found for: PROLACTIN Lab Results  Component Value Date   CHOL 170 02/12/2018   TRIG 63 02/12/2018   HDL 46 02/12/2018   CHOLHDL 3.7 02/12/2018   VLDL 13 02/12/2018   LDLCALC 111 (H) 02/12/2018    Physical Findings: AIMS: Facial and Oral Movements Muscles of Facial Expression: None, normal Lips and Perioral Area: None, normal Jaw: None, normal Tongue: None, normal,Extremity Movements Upper (arms, wrists, hands, fingers): None, normal Lower (legs, knees, ankles, toes): None, normal, Trunk Movements Neck, shoulders, hips: None, normal, Overall Severity Severity of abnormal movements (highest score from questions above): None, normal Incapacitation due to abnormal movements: None, normal Patient's awareness of abnormal movements (rate only patient's report): No Awareness, Dental Status Current problems with teeth and/or  dentures?: No Does patient usually wear dentures?: No  CIWA:  CIWA-Ar Total: 3 COWS:  COWS Total Score: 1  Musculoskeletal: Strength & Muscle Tone: within normal limits Gait & Station: normal Patient leans: N/A  Psychiatric Specialty Exam: Physical Exam  Nursing note and vitals reviewed. Psychiatric: Her affect is labile and inappropriate. Her speech is rapid and/or pressured. She is hyperactive, withdrawn and actively hallucinating. Thought content is paranoid  and delusional. Cognition and memory are normal. She expresses impulsivity.    Review of Systems  Neurological: Negative.   Psychiatric/Behavioral: Positive for hallucinations and substance abuse.  All other systems reviewed and are negative.   Blood pressure 115/77, pulse (!) 110, temperature 97.8 F (36.6 C), temperature source Oral, resp. rate 18, height 5\' 5"  (1.651 m), weight 55.3 kg (122 lb), last menstrual period 02/10/2018, SpO2 100 %.Body mass index is 20.3 kg/m.  General Appearance: Casual  Eye Contact:  Good  Speech:  Clear and Coherent  Volume:  Normal  Mood:  Dysphoric and Irritable  Affect:  Congruent  Thought Process:  Disorganized, Irrelevant and Descriptions of Associations: Loose  Orientation:  Full (Time, Place, and Person)  Thought Content:  Delusions, Hallucinations: Auditory and Paranoid Ideation  Suicidal Thoughts:  No  Homicidal Thoughts:  No  Memory:  Immediate;   Poor Recent;   Poor Remote;   Poor  Judgement:  Poor  Insight:  Lacking  Psychomotor Activity:  Increased  Concentration:  Concentration: Poor and Attention Span: Poor  Recall:  Poor  Fund of Knowledge:  Poor  Language:  Fair  Akathisia:  No  Handed:  Right  AIMS (if indicated):     Assets:  Communication Skills Desire for Improvement Physical Health Resilience Social Support  ADL's:  Intact  Cognition:  WNL  Sleep:  Number of Hours: 7.3     Treatment Plan Summary: Daily contact with patient to assess and evaluate  symptoms and progress in treatment and Medication management   Ms. Lank is a 32 year old female with unknown psychiatric history admitted for psychotic break.She is floridly manic with agitationthat requires PRNs. She continues to be loud and profane.  #Agitation -Haldol 10 mg, Ativan2 mg and Benadryl 50 mg PRN  #Mood and psychosis -continueSeroquel to600 mg nightly, she agreed to take Seroquel -continueDepakote 500 mg TID, VPA level and ammonia in AM  #Insomnia -Trazodone 100 mg nightly  #UTI -Keflex 500 mg TID  #Substance abuse -positive for cannabis -unable to discuss substance abuse treatment  #Smoking cessation -nicotine patch is available  #B12 defficiency -Vit B12 1000 ug daily  #Weight loss -MVI -Ensure TID  #Labs -lipid panel, TSH and A1Care normal -EKGreviewed, NSR with QTc 423 -head CT scan is negative -pregnancy test negative  #Disposition -discharge with family -follow up with [facility]      Kristine Linea, MD 02/16/2018, 7:56 AM

## 2018-02-15 NOTE — Plan of Care (Signed)
Pt. Complaint with medications. Pt. Verbally contracts for safety. Denies SI/HI. Pt. Needs reinforcement on provided education as there is no evidence of learning. Pt. Presents this evening with psychotic features and agitations. Pt. Is fixated on leaving.    Problem: Education: Goal: Knowledge of [facility] Education information/materials will improve Outcome: Not Progressing Goal: Emotional status will improve Outcome: Not Progressing Goal: Mental status will improve Outcome: Not Progressing Goal: Verbalization of understanding the information provided will improve Outcome: Not Progressing   Problem: Education: Goal: Will be free of psychotic symptoms Outcome: Not Progressing Goal: Knowledge of the prescribed therapeutic regimen will improve Outcome: Not Progressing   Problem: Health Behavior/Discharge Planning: Goal: Compliance with prescribed medication regimen will improve Outcome: Progressing   Problem: Safety: Goal: Ability to remain free from injury will improve Outcome: Progressing   Problem: Safety: Goal: Ability to remain free from injury will improve Outcome: Progressing

## 2018-02-15 NOTE — [facility] Group Notes (Signed)
[facility] Group Notes:  (Nursing/MHT/Case Management/Adjunct)  Date:  02/15/2018  Time:  10:52 PM  Type of Therapy:  Group Therapy  Participation Level:  Did Not Attend   Summary of Progress/Problems: Jenna Bell walked up to group, seen all the people and went back to her room.  [name] 02/15/2018, 10:52 PM

## 2018-02-15 NOTE — Plan of Care (Signed)
Patient is irritable. Poor eye contact.  Reluctant to communicate. When questions asked, patient does not respond.  Reluctantly takes medications.  Isolates to room.  Hygiene poor.  Appetite fair.  Support offered.  Safety rounds maintained.   Problem: Coping: Goal: Coping ability will improve Outcome: Progressing   Problem: Health Behavior/Discharge Planning: Goal: Compliance with prescribed medication regimen will improve Outcome: Progressing   Problem: Nutritional: Goal: Ability to achieve adequate nutritional intake will improve Outcome: Progressing   Problem: Safety: Goal: Ability to redirect hostility and anger into socially appropriate behaviors will improve Outcome: Progressing Goal: Ability to remain free from injury will improve Outcome: Progressing

## 2018-02-15 NOTE — [facility] Group Notes (Signed)
Date/Time:  02/15/2018 1PM   Type of Therapy and Topic:  Group Therapy:  Healthy and Unhealthy Supports   Participation Level:  Did Not Attend    Description of Group:  Patients in this group were introduced to the idea of adding a variety of healthy supports to address the various needs in their lives.Patients discussed what additional healthy supports could be helpful in their recovery and wellness after discharge in order to prevent future hospitalizations.   An emphasis was placed on using counselor, doctor, therapy groups, 12-step groups, and problem-specific support groups to expand supports.  They also worked as a group on developing a specific plan for several patients to deal with unhealthy supports through boundary-setting, psychoeducation with loved ones, and even termination of relationships.    Therapeutic Goals:               1)  discuss importance of adding supports to stay well once out of the hospital             2)  compare healthy versus unhealthy supports and identify some examples of each             3)  generate ideas and descriptions of healthy supports that can be added             4)  offer mutual support about how to address unhealthy supports             5)  encourage active participation in and adherence to discharge plan                Summary of Patient Progress:  Patient was encouraged and invited to attend group. Patient did not attend group. Social worker will continue to encourage group participation in the future.       Therapeutic Modalities:   Motivational Interviewing Brief Solution-Focused Therapy   Corleen Otwell, LCSW 02/15/2018 2:17 PM  

## 2018-02-15 NOTE — Plan of Care (Signed)
Patient remains agitated, requires redirection about understanding of not being able to be discharged due to IVC. After talking to writer patient was able to calm down, she voiced concern of being two months pregnant, but she was medication compliant. She appears to be resting in bed at this time.

## 2018-02-15 NOTE — [facility] Suicide Risk Assessment (Signed)
[facility] INPATIENT:  Family/Significant Other Suicide Prevention Education  Suicide Prevention Education:  Contact Attempts:Lisa Erekson, patients mother, [phone] has been identified by the patient as the family member/significant other with whom the patient will be residing, and identified as the person(s) who will aid the patient in the event of a mental health crisis.  With written consent from the patient, two attempts were made to provide suicide prevention education, prior to and/or following the patient's discharge.  We were unsuccessful in providing suicide prevention education.  A suicide education pamphlet was given to the patient to share with family/significant other.  Date and time of first attempt:CSW attempted to contact the patients family member/ significant other to complete SPE and obtain collateral information. CSW was unable to leave a voicemail. 02/12/2018 at 1:49PM.   Date and time of second attempt: CSW attempted to contact the patients family member/ significant other to complete SPE and obtain collateral information.  CSW was unable to leave a voicemail. 02/15/2018 11:03 AM   Jhordyn Hoopingarner  Olena LeatherwoodJarrett 02/15/2018, 11:03 AM

## 2018-02-16 MED ORDER — CEPHALEXIN 500 MG PO CAPS
500.0000 mg | ORAL_CAPSULE | Freq: Three times a day (TID) | ORAL | Status: DC
  Administered 2018-02-16 – 2018-02-21 (×16): 500 mg via ORAL
  Filled 2018-02-16 (×15): qty 1

## 2018-02-16 NOTE — Progress Notes (Addendum)
Received Virga this AM after breakfast, she returned to her room and went into a deep sleep. She received her AM medications late, but was compliant with some resistant. Later after talking with the doctor, she requested  This writer to  show her to the door because the doctor stated she can go home. She was instructed to attend the group therapy session.  She returned to the nurses station and demanded this writer get her clothes because she is leaving. She was told several times it ewas not time for her to leave. She rated anxiety 6/10 and depression 4/10 on her self inventory form.

## 2018-02-16 NOTE — Progress Notes (Signed)
D: Pt. Denies SI/HI/AVH. Pt. Verbally was able to contract for safety. Pt. Complains that, "I'm hoping to go home tomorrow". Pt. During the beginning of shift during our assessment was logical and coherent, but shortly later became very delusional/disorganized utilizing pressured and tangential speech with agitations. Pt. While speaking to this writer held up her daily assessments sheet and stated, "see this paperwork right here says I can go home". Pt. During our later conversation was also very agitated and hyperactive pacing stating, "I'm packing my things and I'm going to leave here". Pt. Was redirectable by TW and took her medications, but was not willing to wait to take medications or take additional medications, just HS meds. Pt. Given HS medications a few minutes before scheduled time for safety and to gain pt. Compliance. Pt. Overall is very labile and can become very preoccupied/agitated and demanding to leave the unit. Pt. When utilizing tangential/disorganized speech presents some comments blaming others for why she is here and presents very disorganized overall. Pt. Believes she does not need any medications and is fine.     A: Q x 15 minute observation checks were completed for safety. Patient was provided with education, but needs a lot of reinforcement and redirection.  Patient was given scheduled medications, refusing PRN medications and ensure (does not like ensure). Patient  was encourage to attend groups, participate in unit activities and continue with plan of care. Pt. Chart and plans of care reviewed. Pt. Given support and encouragement.   R: Patient is complaint with medication and unit procedures for the most part. Pt. Did not attend groups, but did eat a snack.             Precautionary checks every 15 minutes for safety maintained, room free of safety hazards, patient sustains no injury or falls during this shift.

## 2018-02-16 NOTE — [facility] Group Notes (Signed)
LCSW Group Therapy Note   02/16/2018 1:15pm   Type of Therapy and Topic:  Group Therapy:  Positive Affirmations   Participation Level:  Did Not Attend  Description of Group: This group addressed positive affirmation toward self and others. Patients went around the room and identified two positive things about themselves and two positive things about a peer in the room. Patients reflected on how it felt to share something positive with others, to identify positive things about themselves, and to hear positive things from others. Patients were encouraged to have a daily reflection of positive characteristics or circumstances.  Therapeutic Goals 1. Patient will verbalize two of their positive qualities 2. Patient will demonstrate empathy for others by stating two positive qualities about a peer in the group 3. Patient will verbalize their feelings when voicing positive self affirmations and when voicing positive affirmations of others 4. Patients will discuss the potential positive impact on their wellness/recovery of focusing on positive traits of self and others. Summary of Patient Progress:    Therapeutic Modalities Cognitive Behavioral Therapy Motivational Interviewing  Jenna [place], [place] 02/16/2018 3:10 PM

## 2018-02-16 NOTE — Plan of Care (Signed)
Pt continues to show signs of irritability and agitation, needs constant redirection from staff through out the shift. Responding to internal stimuli.

## 2018-02-16 NOTE — Progress Notes (Addendum)
[facility]  02/17/2018 1:22 PM Jenna Bell  MRN:  [id number]  Subjective:   [name] still hallucinates loudly, is easily irritated and demands discharge.  Attempted to call her mother again with no luck. We dod not know if this is her first episode of bipolar. There is a history of developmental disability.   Principal Problem: Bipolar I disorder, current or most recent episode manic, with psychotic features (HCC) Diagnosis:   Patient Active Problem List   Diagnosis Date Noted  . Bipolar I disorder, current or most recent episode manic, with psychotic features (HCC) [F31.2] 02/11/2018    Priority: High  . Tobacco use disorder [F17.200] 02/12/2018  . Cannabis use disorder, moderate, dependence (HCC) [F12.20] 02/12/2018  . UTI (urinary tract infection) [N39.0] 02/12/2018   Total Time spent with patient: 20 minutes  Past Psychiatric History: developmental disability.  Past Medical History:  Past Medical History:  Diagnosis Date  . Bipolar disorder (HCC)    History reviewed. No pertinent surgical history. Family History: History reviewed. No pertinent family history. Family Psychiatric  History: brother with mental illness. Social History:  Social History   Substance and Sexual Activity  Alcohol Use Yes  . Alcohol/week: 1.2 oz  . Types: 2 Glasses of wine per week  . Frequency: Never     Social History   Substance and Sexual Activity  Drug Use Yes  . Types: Methamphetamines    Social History   Socioeconomic History  . Marital status: Single    Spouse name: Not on file  . Number of children: Not on file  . Years of education: Not on file  . Highest education level: Not on file  Occupational History  . Not on file  Social Needs  . Financial resource strain: Not on file  . Food insecurity:    Worry: Not on file    Inability: Not on file  . Transportation needs:    Medical: Not on file    Non-medical: Not on file  Tobacco Use  . Smoking  status: Light Tobacco Smoker    Packs/day: 0.25  . Smokeless tobacco: Never Used  Substance and Sexual Activity  . Alcohol use: Yes    Alcohol/week: 1.2 oz    Types: 2 Glasses of wine per week    Frequency: Never  . Drug use: Yes    Types: Methamphetamines  . Sexual activity: Yes  Lifestyle  . Physical activity:    Days per week: Not on file    Minutes per session: Not on file  . Stress: Not on file  Relationships  . Social connections:    Talks on phone: Not on file    Gets together: Not on file    Attends religious service: Not on file    Active member of club or organization: Not on file    Attends meetings of clubs or organizations: Not on file    Relationship status: Not on file  Other Topics Concern  . Not on file  Social History Narrative  . Not on file   Additional Social History:                         Sleep: Fair  Appetite:  Fair  Current Medications: Current Facility-Administered Medications  Medication Dose Route Frequency Provider Last Rate Last Dose  . alum & mag hydroxide-simeth (MAALOX/MYLANTA) 200-200-20 MG/5ML suspension 30 mL  30 mL Oral Q4H PRN Clapacs, Jackquline DenmarkJohn T, MD      .  cephALEXin (KEFLEX) capsule 500 mg  500 mg Oral Q8H Mahin Guardia B, MD   500 mg at 02/17/18 0621  . diphenhydrAMINE (BENADRYL) capsule 50 mg  50 mg Oral Q6H PRN Dereon Corkery B, MD   50 mg at 02/17/18 0806   Or  . diphenhydrAMINE (BENADRYL) injection 50 mg  50 mg Intramuscular Q6H PRN Happy Begeman B, MD   50 mg at 02/13/18 1009  . [START ON 02/18/2018] divalproex (DEPAKOTE) DR tablet 500 mg  500 mg Oral Q12H Grayton Lobo B, MD      . feeding supplement (ENSURE ENLIVE) (ENSURE ENLIVE) liquid 237 mL  237 mL Oral TID BM Camillia Marcy B, MD   237 mL at 02/17/18 0929  . haloperidol (HALDOL) tablet 10 mg  10 mg Oral Q6H PRN Stonewall Doss B, MD   10 mg at 02/17/18 0806   Or  . haloperidol lactate (HALDOL) injection 10 mg  10 mg Intramuscular  Q6H PRN Raeleen Winstanley B, MD   10 mg at 02/13/18 1009  . hydrOXYzine (ATARAX/VISTARIL) tablet 50 mg  50 mg Oral TID PRN Clapacs, Jackquline Denmark, MD   50 mg at 02/16/18 2017  . LORazepam (ATIVAN) tablet 2 mg  2 mg Oral Q4H PRN Aitan Rossbach B, MD   2 mg at 02/17/18 0806   Or  . LORazepam (ATIVAN) injection 2 mg  2 mg Intramuscular Q4H PRN Tnia Anglada B, MD   2 mg at 02/13/18 1005  . magnesium hydroxide (MILK OF MAGNESIA) suspension 30 mL  30 mL Oral Daily PRN Clapacs, John T, MD      . multivitamin with minerals tablet 1 tablet  1 tablet Oral Daily Jaremy Nosal B, MD   1 tablet at 02/17/18 0810  . QUEtiapine (SEROQUEL) tablet 600 mg  600 mg Oral QHS Prince Couey B, MD   600 mg at 02/16/18 2126  . vitamin B-12 (CYANOCOBALAMIN) tablet 1,000 mcg  1,000 mcg Oral Daily Ekaterina Denise B, MD   1,000 mcg at 02/17/18 1610    Lab Results:  Results for orders placed or performed during the hospital encounter of 02/12/18 (from the past 48 hour(s))  Valproic acid level     Status: Abnormal   Collection Time: 02/17/18  6:20 AM  Result Value Ref Range   Valproic Acid Lvl 114 (H) 50.0 - 100.0 ug/mL    Comment: Performed at [facility], [address] [place], [place] [address]  Ammonia     Status: Abnormal   Collection Time: 02/17/18  6:20 AM  Result Value Ref Range   Ammonia 42 (H) 9 - 35 umol/L    Comment: Performed at [facility], [address] [place], [place] [address]    Blood Alcohol level:  Lab Results  Component Value Date   [facility] <10 02/11/2018    Metabolic Disorder Labs: Lab Results  Component Value Date   HGBA1C 5.4 02/12/2018   MPG 108 02/12/2018   No results found for: PROLACTIN Lab Results  Component Value Date   CHOL 170 02/12/2018   TRIG 63 02/12/2018   HDL 46 02/12/2018   CHOLHDL 3.7 02/12/2018   VLDL 13 02/12/2018   LDLCALC 111 (H) 02/12/2018    Physical Findings: AIMS: Facial and Oral Movements Muscles of Facial  Expression: None, normal Lips and Perioral Area: None, normal Jaw: None, normal Tongue: None, normal,Extremity Movements Upper (arms, wrists, hands, fingers): None, normal Lower (legs, knees, ankles, toes): None, normal, Trunk Movements Neck, shoulders, hips: None,  normal, Overall Severity Severity of abnormal movements (highest score from questions above): None, normal Incapacitation due to abnormal movements: None, normal Patient's awareness of abnormal movements (rate only patient's report): No Awareness, Dental Status Current problems with teeth and/or dentures?: No Does patient usually wear dentures?: No  CIWA:  CIWA-Ar Total: 3 COWS:  COWS Total Score: 1  Musculoskeletal: Strength & Muscle Tone: within normal limits Gait & Station: normal Patient leans: N/A  Psychiatric Specialty Exam: Physical Exam  Nursing note and vitals reviewed. Psychiatric: Her affect is labile and inappropriate. Her speech is rapid and/or pressured. She is hyperactive and actively hallucinating. Thought content is paranoid and delusional. Cognition and memory are impaired. She expresses impulsivity.    Review of Systems  Neurological: Negative.   Psychiatric/Behavioral: Positive for hallucinations and substance abuse.  All other systems reviewed and are negative.   Blood pressure 107/83, pulse (!) 120, temperature 97.8 F (36.6 C), temperature source Oral, resp. rate 18, height 5\' 5"  (1.651 m), weight 55.3 kg (122 lb), last menstrual period 02/10/2018, SpO2 100 %.Body mass index is 20.3 kg/m.  General Appearance: Casual  Eye Contact:  Good  Speech:  Pressured  Volume:  Increased  Mood:  Angry, Dysphoric and Irritable  Affect:  Congruent  Thought Process:  Disorganized, Irrelevant and Descriptions of Associations: Loose  Orientation:  Full (Time, Place, and Person)  Thought Content:  Delusions, Hallucinations: Auditory and Paranoid Ideation  Suicidal Thoughts:  No  Homicidal Thoughts:  No   Memory:  Immediate;   Poor Recent;   Poor Remote;   Poor  Judgement:  Poor  Insight:  Lacking  Psychomotor Activity:  Increased  Concentration:  Concentration: Poor and Attention Span: Poor  Recall:  Poor  Fund of Knowledge:  Fair  Language:  Fair  Akathisia:  Yes  Handed:  Right  AIMS (if indicated):     Assets:  Communication Skills Desire for Improvement Financial Resources/Insurance Physical Health Resilience Social Support  ADL's:  Intact  Cognition:  WNL  Sleep:  Number of Hours: 6.75     Treatment Plan Summary: Daily contact with patient to assess and evaluate symptoms and progress in treatment and Medication management   Ms. Palinkas is a 32 year old female with unknown psychiatric history admitted for psychotic break.She is floridly manic with agitationthat requires PRNs. She continues to be loud and profane.  #Agitation -Haldol 10 mg, Ativan2 mg and Benadryl 50 mg PRN  #Mood and psychosis -continueSeroquel to600 mg nightly -lower Depakote to 500 mg BID beginning tomorrow, VPA level 114 on 7/15  #Insomnia -Trazodone 100 mg nightly  #UTI -Keflex 500 mg TID  #Substance abuse -positive for cannabis -unable to discuss substance abuse treatment  #Smoking cessation -nicotine patch is available  #B12 defficiency -Vit B12 1000 ug daily  #Weight loss -MVI -Ensure TID  #Labs -lipid panel, TSH and A1Care normal -EKGreviewed, NSR with QTc 423 -head CT scan is negative -pregnancy test negative  #Disposition -discharge with family -[facility]    Kristine Linea, MD 02/17/2018, 1:22 PM

## 2018-02-17 LAB — VALPROIC ACID LEVEL: VALPROIC ACID LVL: 114 ug/mL — AB (ref 50.0–100.0)

## 2018-02-17 LAB — AMMONIA: Ammonia: 42 umol/L — ABNORMAL HIGH (ref 9–35)

## 2018-02-17 MED ORDER — DIVALPROEX SODIUM 500 MG PO DR TAB
500.0000 mg | DELAYED_RELEASE_TABLET | Freq: Two times a day (BID) | ORAL | Status: DC
  Administered 2018-02-18 – 2018-03-07 (×35): 500 mg via ORAL
  Filled 2018-02-17 (×35): qty 1

## 2018-02-17 NOTE — Progress Notes (Signed)
Recreation Therapy Notes  Date: 02/17/2018  Time: 9:30 am   Location: Craft Room   Behavioral response: N/A   Intervention Topic:  Stress  Discussion/Intervention: Patient did not attend group.   Clinical Observations/Feedback:  Patient did not attend group.   Gaberial Cada LRT/CTRS        Hena Ewalt 02/17/2018 11:45 AM 

## 2018-02-17 NOTE — Progress Notes (Signed)
D: Pt denies SI/HI/AVH. Pt is frequently isolative and withdrawn during this shift. Pt. During assessments and in the early mornings was very agitated and anxious responding to internal stimuli utilizing disorganized thought process. Pt. This morning was yelling in her room at what presented to this writer as voices she is responding to. Pt. Given PRN medications to reduce agitations with good response.   A: Q x 15 minute observation checks were completed for safety. Patient was provided with education, but refuses.  Patient was given scheduled/prn medications. Patient  was encourage to attend groups, participate in unit activities and continue with plan of care. Pt. Chart and plans of care reviewed. Pt. Given support and encouragement.   R: Patient is complaint with medication, but does not participate in unit activities.             Precautionary checks every 15 minutes for safety maintained, room free of safety hazards, patient sustains no injury or falls during this shift.

## 2018-02-17 NOTE — [facility] Group Notes (Signed)
[facility] Group Notes:  (Nursing/MHT/Case Management/Adjunct)  Date:  02/17/2018  Time:  10:24 PM  Type of Therapy:  Group Therapy  Participation Level:  Did Not Attend    Kai LevinsKori  Hartlee Amedee 02/17/2018, 10:24 PM

## 2018-02-17 NOTE — [facility] Group Notes (Signed)
LCSW Group Therapy Note   02/17/2018 1:00pm   Type of Therapy and Topic:  Group Therapy:  Overcoming Obstacles   Participation Level:  Did Not Attend   Description of Group:    In this group patients will be encouraged to explore what they see as obstacles to their own wellness and recovery. They will be guided to discuss their thoughts, feelings, and behaviors related to these obstacles. The group will process together ways to cope with barriers, with attention given to specific choices patients can make. Each patient will be challenged to identify changes they are motivated to make in order to overcome their obstacles. This group will be process-oriented, with patients participating in exploration of their own experiences as well as giving and receiving support and challenge from other group members.   Therapeutic Goals: 1. Patient will identify personal and current obstacles as they relate to admission. 2. Patient will identify barriers that currently interfere with their wellness or overcoming obstacles.  3. Patient will identify feelings, thought process and behaviors related to these barriers. 4. Patient will identify two changes they are willing to make to overcome these obstacles:      Summary of Patient Progress      Therapeutic Modalities:   Cognitive Behavioral Therapy Solution Focused Therapy Motivational Interviewing Relapse Prevention Therapy  Jenna Bell Jenna Vina Byrd, LCSW 02/17/2018 3:18 PM

## 2018-02-17 NOTE — Tx Team (Signed)
Interdisciplinary Treatment and Diagnostic Plan Update  02/17/2018 Time of Session: 10:30am Elianny Buxbaum MRN: [id number]  Principal Diagnosis: Bipolar I disorder, current or most recent episode manic, with psychotic features (HCC)  Secondary Diagnoses: Principal Problem:   Bipolar I disorder, current or most recent episode manic, with psychotic features (HCC) Active Problems:   Tobacco use disorder   Cannabis use disorder, moderate, dependence (HCC)   UTI (urinary tract infection)   Current Medications:  Current Facility-Administered Medications  Medication Dose Route Frequency Provider Last Rate Last Dose  . alum & mag hydroxide-simeth (MAALOX/MYLANTA) 200-200-20 MG/5ML suspension 30 mL  30 mL Oral Q4H PRN Clapacs, John T, MD      . cephALEXin (KEFLEX) capsule 500 mg  500 mg Oral Q8H Pucilowska, Jolanta B, MD   500 mg at 02/17/18 0621  . diphenhydrAMINE (BENADRYL) capsule 50 mg  50 mg Oral Q6H PRN Pucilowska, Jolanta B, MD   50 mg at 02/17/18 0806   Or  . diphenhydrAMINE (BENADRYL) injection 50 mg  50 mg Intramuscular Q6H PRN Pucilowska, Jolanta B, MD   50 mg at 02/13/18 1009  . divalproex (DEPAKOTE) DR tablet 500 mg  500 mg Oral Q8H Pucilowska, Jolanta B, MD   500 mg at 02/17/18 0621  . feeding supplement (ENSURE ENLIVE) (ENSURE ENLIVE) liquid 237 mL  237 mL Oral TID BM Pucilowska, Jolanta B, MD   237 mL at 02/17/18 0929  . haloperidol (HALDOL) tablet 10 mg  10 mg Oral Q6H PRN Pucilowska, Jolanta B, MD   10 mg at 02/17/18 0806   Or  . haloperidol lactate (HALDOL) injection 10 mg  10 mg Intramuscular Q6H PRN Pucilowska, Jolanta B, MD   10 mg at 02/13/18 1009  . hydrOXYzine (ATARAX/VISTARIL) tablet 50 mg  50 mg Oral TID PRN Clapacs, Jackquline Denmark, MD   50 mg at 02/16/18 2017  . LORazepam (ATIVAN) tablet 2 mg  2 mg Oral Q4H PRN Pucilowska, Jolanta B, MD   2 mg at 02/17/18 0806   Or  . LORazepam (ATIVAN) injection 2 mg  2 mg Intramuscular Q4H PRN Pucilowska, Jolanta B, MD   2 mg at  02/13/18 1005  . magnesium hydroxide (MILK OF MAGNESIA) suspension 30 mL  30 mL Oral Daily PRN Clapacs, John T, MD      . multivitamin with minerals tablet 1 tablet  1 tablet Oral Daily Pucilowska, Jolanta B, MD   1 tablet at 02/17/18 0810  . QUEtiapine (SEROQUEL) tablet 600 mg  600 mg Oral QHS Pucilowska, Jolanta B, MD   600 mg at 02/16/18 2126  . vitamin B-12 (CYANOCOBALAMIN) tablet 1,000 mcg  1,000 mcg Oral Daily Pucilowska, Jolanta B, MD   1,000 mcg at 02/17/18 0810   PTA Medications: No medications prior to admission.    Patient Stressors: Medication change or noncompliance Substance abuse  Patient Strengths: Motivation for treatment/growth Supportive family/friends  Treatment Modalities: Medication Management, Group therapy, Case management,  1 to 1 session with clinician, Psychoeducation, Recreational therapy.   Physician Treatment Plan for Primary Diagnosis: Bipolar I disorder, current or most recent episode manic, with psychotic features (HCC) Long Term Goal(s): Improvement in symptoms so as ready for discharge Improvement in symptoms so as ready for discharge   Short Term Goals: Ability to identify changes in lifestyle to reduce recurrence of condition will improve Ability to verbalize feelings will improve Ability to disclose and discuss suicidal ideas Ability to demonstrate self-control will improve Ability to identify and develop effective coping behaviors will  improve Ability to maintain clinical measurements within normal limits will improve Compliance with prescribed medications will improve Ability to identify changes in lifestyle to reduce recurrence of condition will improve Ability to demonstrate self-control will improve Ability to identify triggers associated with substance abuse/mental health issues will improve  Medication Management: Evaluate patient's response, side effects, and tolerance of medication regimen.  Therapeutic Interventions: 1 to 1  sessions, Unit Group sessions and Medication administration.  Evaluation of Outcomes: Not Progressing  Physician Treatment Plan for Secondary Diagnosis: Principal Problem:   Bipolar I disorder, current or most recent episode manic, with psychotic features (HCC) Active Problems:   Tobacco use disorder   Cannabis use disorder, moderate, dependence (HCC)   UTI (urinary tract infection)  Long Term Goal(s): Improvement in symptoms so as ready for discharge Improvement in symptoms so as ready for discharge   Short Term Goals: Ability to identify changes in lifestyle to reduce recurrence of condition will improve Ability to verbalize feelings will improve Ability to disclose and discuss suicidal ideas Ability to demonstrate self-control will improve Ability to identify and develop effective coping behaviors will improve Ability to maintain clinical measurements within normal limits will improve Compliance with prescribed medications will improve Ability to identify changes in lifestyle to reduce recurrence of condition will improve Ability to demonstrate self-control will improve Ability to identify triggers associated with substance abuse/mental health issues will improve     Medication Management: Evaluate patient's response, side effects, and tolerance of medication regimen.  Therapeutic Interventions: 1 to 1 sessions, Unit Group sessions and Medication administration.  Evaluation of Outcomes: Not Progressing   RN Treatment Plan for Primary Diagnosis: Bipolar I disorder, current or most recent episode manic, with psychotic features (HCC) Long Term Goal(s): Knowledge of disease and therapeutic regimen to maintain health will improve  Short Term Goals: Ability to participate in decision making will improve, Ability to verbalize feelings will improve, Ability to identify and develop effective coping behaviors will improve and Compliance with prescribed medications will  improve  Medication Management: RN will administer medications as ordered by provider, will assess and evaluate patient's response and provide education to patient for prescribed medication. RN will report any adverse and/or side effects to prescribing provider.  Therapeutic Interventions: 1 on 1 counseling sessions, Psychoeducation, Medication administration, Evaluate responses to treatment, Monitor vital signs and CBGs as ordered, Perform/monitor CIWA, COWS, AIMS and Fall Risk screenings as ordered, Perform wound care treatments as ordered.  Evaluation of Outcomes: Not Progressing   LCSW Treatment Plan for Primary Diagnosis: Bipolar I disorder, current or most recent episode manic, with psychotic features (HCC) Long Term Goal(s): Safe transition to appropriate next level of care at discharge, Engage patient in therapeutic group addressing interpersonal concerns.  Short Term Goals: Engage patient in aftercare planning with referrals and resources, Increase social support, Facilitate acceptance of mental health diagnosis and concerns, Identify triggers associated with mental health/substance abuse issues and Increase skills for wellness and recovery  Therapeutic Interventions: Assess for all discharge needs, 1 to 1 time with Social worker, Explore available resources and support systems, Assess for adequacy in community support network, Educate family and significant other(s) on suicide prevention, Complete Psychosocial Assessment, Interpersonal group therapy.  Evaluation of Outcomes: Not Progressing   Progress in Treatment: Attending groups: No. Participating in groups: No. Taking medication as prescribed: Yes. Toleration medication: Yes. Family/Significant other contact made: No, will contact:  Patients mother Kristie CowmanLisa Flamenco Patient understands diagnosis: No. Discussing patient identified problems/goals with staff: Yes. Medical problems  stabilized or resolved: Yes. Denies  suicidal/homicidal ideation: Yes. Issues/concerns per patient self-inventory: No. Other:   New problem(s) identified: No, Describe:  None  New Short Term/Long Term Goal(s): No goal  Patient Goals:  No goal  [place] with [place].  Reason for Continuation of Hospitalization: Medication stabilization  Estimated Length of Stay: 7 days  Recreational Therapy: Patient Stressors: N/A Patient Goal: Patient will engage in interactions with peers and staff in pro-social manner at least 2x within 5 recreation therapy group sessions  Attendees: Patient:  02/17/2018 11:47 AM  Physician: Dr. Jennet Maduro, MD 02/17/2018 11:47 AM  Nursing: Hulan Amato, RN 02/17/2018 11:47 AM  RN Care Manager: 02/17/2018 11:47 AM  Social Worker: 02/17/2018 11:47 AM  Recreational Therapist: Danella Deis. Dreama Saa, LRT 02/17/2018 11:47 AM  Other: Heidi Dach, LCSW 02/17/2018 11:47 AM  Other: Jake Shark, LCSW 02/17/2018 11:47 AM  Other:  02/17/2018 11:47 AM    Scribe for Treatment Team: Heidi Dach, LCSW 02/17/2018 11:47 AM

## 2018-02-17 NOTE — Plan of Care (Signed)
Pt. Compliant with medications this morning. Pt. Verbally is able to contract for safety and denies SI/HI. Safety monitored by staff. Pt. Needs reinforcement on provided education. Pt. Presents during assessments very agitated and anxious responding to internal stimuli.    Problem: Health Behavior/Discharge Planning: Goal: Compliance with prescribed medication regimen will improve Outcome: Progressing   Problem: Safety: Goal: Ability to remain free from injury will improve Outcome: Progressing   Problem: Safety: Goal: Ability to remain free from injury will improve Outcome: Progressing   Problem: Education: Goal: Knowledge of [facility] Education information/materials will improve Outcome: Not Progressing Goal: Emotional status will improve Outcome: Not Progressing Goal: Mental status will improve Outcome: Not Progressing Goal: Verbalization of understanding the information provided will improve Outcome: Not Progressing   Problem: Education: Goal: Will be free of psychotic symptoms Outcome: Not Progressing Goal: Knowledge of the prescribed therapeutic regimen will improve Outcome: Not Progressing   Problem: Coping: Goal: Coping ability will improve Outcome: Not Progressing

## 2018-02-18 NOTE — Plan of Care (Signed)
Isolative, disheveled, poorly kept appearance, mostly in bed sleeping. RASS Score=1, arousal level enough to sit up to take her medications and Ensure. Unable to assess her state of mind due to light sedation.  Patient slept for Estimated Hours of 8.30; Precautionary checks every 15 minutes for safety maintained, room free of safety hazards, patient sustains no injury or falls during this shift.  Problem: Education: Goal: Will be free of psychotic symptoms Outcome: Progressing   Problem: Safety: Goal: Ability to remain free from injury will improve Outcome: Progressing   Problem: Education: Goal: Emotional status will improve Outcome: Not Progressing Goal: Mental status will improve Outcome: Not Progressing Goal: Verbalization of understanding the information provided will improve Outcome: Not Progressing   Problem: Coping: Goal: Coping ability will improve Outcome: Not Progressing Goal: Will verbalize feelings Outcome: Not Progressing   Problem: Role Relationship: Goal: Ability to interact with others will improve Outcome: Not Progressing

## 2018-02-18 NOTE — Progress Notes (Signed)
[facility] MD Progress Note  02/18/2018 9:57 AM Janeen Watson  MRN:  [id number]  Subjective:    Ms. Omura has not been improving enough. She is still actively hallucinating, yelling loudly at her voices and continues to be paranoid and delusional. She is able to hold a brief conversation on neutral topics, as long as it does not touch on her mental issues when she gets agitated. She however is compliant with medications as evidenced by high Depakote level.   Spoke extemsively with her mother. The patient went to jail in April, unclear why but she quit her job before.. According to her boyfriend, she came back from jail changed. Paranoid hallucinating. Mother saw her at family reunion during July 4 holiday. The patient was talking to herself, giggling, cursing at family, did not eat a bite. The mother confirms tht there is a history of developmental disability but the patient has never been psychotic.Marland Kitchen   Principal Problem: Bipolar I disorder, current or most recent episode manic, with psychotic features (HCC) Diagnosis:   Patient Active Problem List   Diagnosis Date Noted  . Bipolar I disorder, current or most recent episode manic, with psychotic features (HCC) [F31.2] 02/11/2018    Priority: High  . Tobacco use disorder [F17.200] 02/12/2018  . Cannabis use disorder, moderate, dependence (HCC) [F12.20] 02/12/2018  . UTI (urinary tract infection) [N39.0] 02/12/2018   Total Time spent with patient: 20 minutes  Past Psychiatric History: developmental disability  Past Medical History:  Past Medical History:  Diagnosis Date  . Bipolar disorder (HCC)    History reviewed. No pertinent surgical history. Family History: History reviewed. No pertinent family history. Family Psychiatric  History: brother with mental illness Social History:  Social History   Substance and Sexual Activity  Alcohol Use Yes  . Alcohol/week: 1.2 oz  . Types: 2 Glasses of wine per week  . Frequency: Never      Social History   Substance and Sexual Activity  Drug Use Yes  . Types: Methamphetamines    Social History   Socioeconomic History  . Marital status: Single    Spouse name: Not on file  . Number of children: Not on file  . Years of education: Not on file  . Highest education level: Not on file  Occupational History  . Not on file  Social Needs  . Financial resource strain: Not on file  . Food insecurity:    Worry: Not on file    Inability: Not on file  . Transportation needs:    Medical: Not on file    Non-medical: Not on file  Tobacco Use  . Smoking status: Light Tobacco Smoker    Packs/day: 0.25  . Smokeless tobacco: Never Used  Substance and Sexual Activity  . Alcohol use: Yes    Alcohol/week: 1.2 oz    Types: 2 Glasses of wine per week    Frequency: Never  . Drug use: Yes    Types: Methamphetamines  . Sexual activity: Yes  Lifestyle  . Physical activity:    Days per week: Not on file    Minutes per session: Not on file  . Stress: Not on file  Relationships  . Social connections:    Talks on phone: Not on file    Gets together: Not on file    Attends religious service: Not on file    Active member of club or organization: Not on file    Attends meetings of clubs or organizations: Not on file  Relationship status: Not on file  Other Topics Concern  . Not on file  Social History Narrative  . Not on file   Additional Social History:                         Sleep: Fair  Appetite:  Good  Current Medications: Current Facility-Administered Medications  Medication Dose Route Frequency Provider Last Rate Last Dose  . alum & mag hydroxide-simeth (MAALOX/MYLANTA) 200-200-20 MG/5ML suspension 30 mL  30 mL Oral Q4H PRN Clapacs, John T, MD      . cephALEXin (KEFLEX) capsule 500 mg  500 mg Oral Q8H Rolfe Hartsell B, MD   500 mg at 02/18/18 0610  . diphenhydrAMINE (BENADRYL) capsule 50 mg  50 mg Oral Q6H PRN Gwendolyne Welford B, MD   50 mg at  02/17/18 0806   Or  . diphenhydrAMINE (BENADRYL) injection 50 mg  50 mg Intramuscular Q6H PRN Barron Vanloan B, MD   50 mg at 02/13/18 1009  . divalproex (DEPAKOTE) DR tablet 500 mg  500 mg Oral Q12H Mansa Willers B, MD   500 mg at 02/18/18 0838  . feeding supplement (ENSURE ENLIVE) (ENSURE ENLIVE) liquid 237 mL  237 mL Oral TID BM Manoj Enriquez B, MD   237 mL at 02/17/18 2045  . haloperidol (HALDOL) tablet 10 mg  10 mg Oral Q6H PRN Cleta Heatley B, MD   10 mg at 02/17/18 0806   Or  . haloperidol lactate (HALDOL) injection 10 mg  10 mg Intramuscular Q6H PRN Konnar Ben B, MD   10 mg at 02/13/18 1009  . hydrOXYzine (ATARAX/VISTARIL) tablet 50 mg  50 mg Oral TID PRN Clapacs, Jackquline Denmark, MD   50 mg at 02/16/18 2017  . LORazepam (ATIVAN) tablet 2 mg  2 mg Oral Q4H PRN Koula Venier B, MD   2 mg at 02/17/18 0806   Or  . LORazepam (ATIVAN) injection 2 mg  2 mg Intramuscular Q4H PRN Josey Forcier B, MD   2 mg at 02/13/18 1005  . magnesium hydroxide (MILK OF MAGNESIA) suspension 30 mL  30 mL Oral Daily PRN Clapacs, John T, MD      . multivitamin with minerals tablet 1 tablet  1 tablet Oral Daily Katee Wentland B, MD   1 tablet at 02/18/18 0837  . QUEtiapine (SEROQUEL) tablet 600 mg  600 mg Oral QHS Jerzey Komperda B, MD   600 mg at 02/17/18 2129  . vitamin B-12 (CYANOCOBALAMIN) tablet 1,000 mcg  1,000 mcg Oral Daily Makhya Arave B, MD   1,000 mcg at 02/18/18 1610    Lab Results:  Results for orders placed or performed during the hospital encounter of 02/12/18 (from the past 48 hour(s))  Valproic acid level     Status: Abnormal   Collection Time: 02/17/18  6:20 AM  Result Value Ref Range   Valproic Acid Lvl 114 (H) 50.0 - 100.0 ug/mL    Comment: Performed at [facility], [address] [place], [place] [address]  Ammonia     Status: Abnormal   Collection Time: 02/17/18  6:20 AM  Result Value Ref Range   Ammonia 42 (H) 9 - 35 umol/L     Comment: Performed at [facility], [address], [place], [place] [address]    Blood Alcohol level:  Lab Results  Component Value Date   [facility] <10 02/11/2018    Metabolic Disorder Labs: Lab Results  Component Value Date  HGBA1C 5.4 02/12/2018   MPG 108 02/12/2018   No results found for: PROLACTIN Lab Results  Component Value Date   CHOL 170 02/12/2018   TRIG 63 02/12/2018   HDL 46 02/12/2018   CHOLHDL 3.7 02/12/2018   VLDL 13 02/12/2018   LDLCALC 111 (H) 02/12/2018    Physical Findings: AIMS: Facial and Oral Movements Muscles of Facial Expression: None, normal Lips and Perioral Area: None, normal Jaw: None, normal Tongue: None, normal,Extremity Movements Upper (arms, wrists, hands, fingers): None, normal Lower (legs, knees, ankles, toes): None, normal, Trunk Movements Neck, shoulders, hips: None, normal, Overall Severity Severity of abnormal movements (highest score from questions above): None, normal Incapacitation due to abnormal movements: None, normal Patient's awareness of abnormal movements (rate only patient's report): No Awareness, Dental Status Current problems with teeth and/or dentures?: No Does patient usually wear dentures?: No  CIWA:  CIWA-Ar Total: 3 COWS:  COWS Total Score: 1  Musculoskeletal: Strength & Muscle Tone: within normal limits Gait & Station: normal Patient leans: N/A  Psychiatric Specialty Exam: Physical Exam  Nursing note and vitals reviewed. Psychiatric: Her affect is angry, labile and inappropriate. Her speech is rapid and/or pressured. She is actively hallucinating. Thought content is paranoid and delusional. Cognition and memory are impaired. She expresses impulsivity.    Review of Systems  Neurological: Negative.   Psychiatric/Behavioral: Positive for hallucinations and substance abuse.  All other systems reviewed and are negative.   Blood pressure 117/80, pulse (!) 107, temperature 97.7 F (36.5 C),  temperature source Oral, resp. rate 18, height 5\' 5"  (1.651 m), weight 55.3 kg (122 lb), last menstrual period 02/10/2018, SpO2 100 %.Body mass index is 20.3 kg/m.  General Appearance: Disheveled  Eye Contact:  Good  Speech:  Pressured  Volume:  Increased  Mood:  Dysphoric and Irritable  Affect:  Congruent  Thought Process:  Disorganized, Irrelevant and Descriptions of Associations: Loose  Orientation:  Full (Time, Place, and Person)  Thought Content:  Delusions, Hallucinations: Auditory and Paranoid Ideation  Suicidal Thoughts:  No  Homicidal Thoughts:  No  Memory:  Immediate;   Fair Recent;   Fair Remote;   Fair  Judgement:  Poor  Insight:  Lacking  Psychomotor Activity:  Increased  Concentration:  Concentration: Fair and Attention Span: Fair  Recall:  [employer] of Knowledge:  Fair  Language:  Fair  Akathisia:  No  Handed:  Right  AIMS (if indicated):     Assets:  Communication Skills Desire for Improvement Physical Health Resilience  ADL's:  Intact  Cognition:  WNL  Sleep:  Number of Hours: 8.3     Treatment Plan Summary: Daily contact with patient to assess and evaluate symptoms and progress in treatment and Medication management   Ms. Noda is a 32 year old female with unknown psychiatric history admitted for psychotic break.She is floridly manic with agitationthat requires PRNs. Shecontinues to beloud and profane.  #Agitation -Haldol 10 mg, Ativan2 mg and Benadryl 50 mg PRN  #Mood and psychosis -continueSeroquel to600 mg nightly -lower Depakote to 500 mg BID beginning tomorrow, VPA level 114 on 7/15 -consider long lasting injectable  #Insomnia -Trazodone 100 mg nightly  #UTI -Keflex 500 mg TID  #Substance abuse -positive for cannabis -unable to discuss substance abuse treatment  #Smoking cessation -nicotine patch is available  #B12 defficiency -Vit B12 1000 ug daily  #Weight loss -MVI -Ensure TID  #Labs -lipid panel, TSH  and [facility] normal -EKGreviewed, NSR with QTc 423 -head CT scan is negative -pregnancy test  negative  #Disposition -discharge with family -[facility]    Kristine LineaJolanta Tamesha Ellerbrock, MD 02/18/2018, 9:57 AM

## 2018-02-18 NOTE — Progress Notes (Signed)
[facility] MD Progress Note  02/19/2018 1:05 PM Careen Mauch  MRN:  [id number]  Subjective:    Ms. Fessenden has been improving. She started going to goups and participated effectively. She is still hallucinating loudly and gets easily confused. She believes that because she is "32 years old", she does not need to be in the hospital. She started very nicely during the interview but shortly stormed out of the room demanding "paperwork".  The patient is homeless and needs to be stabilized before discharged to the homeless shelter. With hallucinations and agitation, she will not last there id at all admitted.   Principal Problem: Bipolar I disorder, current or most recent episode manic, with psychotic features (HCC) Diagnosis:   Patient Active Problem List   Diagnosis Date Noted  . Bipolar I disorder, current or most recent episode manic, with psychotic features (HCC) [F31.2] 02/11/2018    Priority: High  . Tobacco use disorder [F17.200] 02/12/2018  . Cannabis use disorder, moderate, dependence (HCC) [F12.20] 02/12/2018  . UTI (urinary tract infection) [N39.0] 02/12/2018   Total Time spent with patient: 20 minutes  Past Psychiatric History: developmental disability  Past Medical History:  Past Medical History:  Diagnosis Date  . Bipolar disorder (HCC)    History reviewed. No pertinent surgical history. Family History: History reviewed. No pertinent family history. Family Psychiatric  History: none Social History:  Social History   Substance and Sexual Activity  Alcohol Use Yes  . Alcohol/week: 1.2 oz  . Types: 2 Glasses of wine per week  . Frequency: Never     Social History   Substance and Sexual Activity  Drug Use Yes  . Types: Methamphetamines    Social History   Socioeconomic History  . Marital status: Single    Spouse name: Not on file  . Number of children: Not on file  . Years of education: Not on file  . Highest education level: Not on file  Occupational  History  . Not on file  Social Needs  . Financial resource strain: Not on file  . Food insecurity:    Worry: Not on file    Inability: Not on file  . Transportation needs:    Medical: Not on file    Non-medical: Not on file  Tobacco Use  . Smoking status: Light Tobacco Smoker    Packs/day: 0.25  . Smokeless tobacco: Never Used  Substance and Sexual Activity  . Alcohol use: Yes    Alcohol/week: 1.2 oz    Types: 2 Glasses of wine per week    Frequency: Never  . Drug use: Yes    Types: Methamphetamines  . Sexual activity: Yes  Lifestyle  . Physical activity:    Days per week: Not on file    Minutes per session: Not on file  . Stress: Not on file  Relationships  . Social connections:    Talks on phone: Not on file    Gets together: Not on file    Attends religious service: Not on file    Active member of club or organization: Not on file    Attends meetings of clubs or organizations: Not on file    Relationship status: Not on file  Other Topics Concern  . Not on file  Social History Narrative  . Not on file   Additional Social History:                         Sleep: Fair  Appetite:  Fair  Current Medications: Current Facility-Administered Medications  Medication Dose Route Frequency Provider Last Rate Last Dose  . alum & mag hydroxide-simeth (MAALOX/MYLANTA) 200-200-20 MG/5ML suspension 30 mL  30 mL Oral Q4H PRN Clapacs, John T, MD      . cephALEXin (KEFLEX) capsule 500 mg  500 mg Oral Q8H Shalynn Jorstad B, MD   500 mg at 02/19/18 [id number]  . diphenhydrAMINE (BENADRYL) capsule 50 mg  50 mg Oral Q6H PRN Shannin Naab B, MD   50 mg at 02/19/18 1245   Or  . diphenhydrAMINE (BENADRYL) injection 50 mg  50 mg Intramuscular Q6H PRN Ruby Logiudice B, MD   50 mg at 02/13/18 1009  . divalproex (DEPAKOTE) DR tablet 500 mg  500 mg Oral Q12H Cheryllynn Sarff B, MD   500 mg at 02/19/18 0833  . feeding supplement (ENSURE ENLIVE) (ENSURE ENLIVE) liquid 237  mL  237 mL Oral TID BM Julious Langlois B, MD   237 mL at 02/19/18 1027  . haloperidol (HALDOL) tablet 10 mg  10 mg Oral Q6H PRN Rosetta Rupnow B, MD   10 mg at 02/19/18 1245   Or  . haloperidol lactate (HALDOL) injection 10 mg  10 mg Intramuscular Q6H PRN Carly Sabo B, MD   10 mg at 02/13/18 1009  . hydrOXYzine (ATARAX/VISTARIL) tablet 50 mg  50 mg Oral TID PRN Clapacs, Jackquline DenmarkJohn T, MD   50 mg at 02/16/18 2017  . LORazepam (ATIVAN) tablet 2 mg  2 mg Oral Q4H PRN Lua Feng B, MD   2 mg at 02/19/18 1245   Or  . LORazepam (ATIVAN) injection 2 mg  2 mg Intramuscular Q4H PRN Dwanna Goshert B, MD   2 mg at 02/13/18 1005  . magnesium hydroxide (MILK OF MAGNESIA) suspension 30 mL  30 mL Oral Daily PRN Clapacs, John T, MD      . multivitamin with minerals tablet 1 tablet  1 tablet Oral Daily Adonica Fukushima B, MD   1 tablet at 02/19/18 0833  . QUEtiapine (SEROQUEL) tablet 600 mg  600 mg Oral QHS Sherrick Araki B, MD   600 mg at 02/18/18 2113  . vitamin B-12 (CYANOCOBALAMIN) tablet 1,000 mcg  1,000 mcg Oral Daily Aara Jacquot B, MD   1,000 mcg at 02/19/18 [id number]    Lab Results:  No results found for this or any previous visit (from the past 48 hour(s)).  Blood Alcohol level:  Lab Results  Component Value Date   ETH <10 02/11/2018    Metabolic Disorder Labs: Lab Results  Component Value Date   HGBA1C 5.4 02/12/2018   MPG 108 02/12/2018   No results found for: PROLACTIN Lab Results  Component Value Date   CHOL 170 02/12/2018   TRIG 63 02/12/2018   HDL 46 02/12/2018   CHOLHDL 3.7 02/12/2018   VLDL 13 02/12/2018   LDLCALC 111 (H) 02/12/2018    Physical Findings: AIMS: Facial and Oral Movements Muscles of Facial Expression: None, normal Lips and Perioral Area: None, normal Jaw: None, normal Tongue: None, normal,Extremity Movements Upper (arms, wrists, hands, fingers): None, normal Lower (legs, knees, ankles, toes): None, normal, Trunk  Movements Neck, shoulders, hips: None, normal, Overall Severity Severity of abnormal movements (highest score from questions above): None, normal Incapacitation due to abnormal movements: None, normal Patient's awareness of abnormal movements (rate only patient's report): No Awareness, Dental Status Current problems with teeth and/or dentures?: No Does patient usually wear dentures?: No  CIWA:  CIWA-Ar Total: 3 COWS:  COWS  Total Score: 1  Musculoskeletal: Strength & Muscle Tone: within normal limits Gait & Station: normal Patient leans: N/A  Psychiatric Specialty Exam: Physical Exam  Nursing note and vitals reviewed. Psychiatric: Her affect is angry, labile and inappropriate. Her speech is rapid and/or pressured. She is actively hallucinating. Thought content is paranoid. Cognition and memory are impaired. She expresses impulsivity.    Review of Systems  Neurological: Negative.   Psychiatric/Behavioral: Positive for hallucinations and substance abuse.  All other systems reviewed and are negative.   Blood pressure (!) 122/94, pulse (!) 119, temperature 97.8 F (36.6 C), temperature source Oral, resp. rate 18, height 5\' 5"  (1.651 m), weight 55.3 kg (122 lb), last menstrual period 02/10/2018, SpO2 99 %.Body mass index is 20.3 kg/m.  General Appearance: Fairly Groomed  Eye Contact:  Good  Speech:  Pressured  Volume:  Increased  Mood:  Angry  Affect:  Congruent  Thought Process:  Goal Directed and Descriptions of Associations: Intact  Orientation:  Full (Time, Place, and Person)  Thought Content:  Delusions, Hallucinations: Auditory and Paranoid Ideation  Suicidal Thoughts:  No  Homicidal Thoughts:  No  Memory:  Immediate;   Fair Recent;   Fair Remote;   Fair  Judgement:  Poor  Insight:  Lacking  Psychomotor Activity:  Normal  Concentration:  Concentration: Fair and Attention Span: Fair  Recall:  [employer] of Knowledge:  Fair  Language:  Fair  Akathisia:  No  Handed:   Right  AIMS (if indicated):     Assets:  Communication Skills Desire for Improvement Physical Health Resilience  ADL's:  Intact  Cognition:  WNL  Sleep:  Number of Hours: 6.3     Treatment Plan Summary: Daily contact with patient to assess and evaluate symptoms and progress in treatment and Medication management   Ms. Dekker is a 32 year old female with unknown psychiatric history admitted for psychotic break.She is floridly manic with agitationthat requires PRNs. Shecontinues to beloud and profane.  #Agitation -Haldol 10 mg, Ativan2 mg and Benadryl 50 mg PRN  #Mood and psychosis -continueSeroquel to600 mg nightly -lower Depakote to 500 mg BID beginning tomorrow, VPA level 114 on 7/15 -consider long lasting injectable  #Insomnia -Trazodone 100 mg nightly  #UTI -Keflex 500 mg TID  #Substance abuse -positive for cannabis -unable to discuss substance abuse treatment  #Smoking cessation -nicotine patch is available  #B12 defficiency -Vit B12 1000 ug daily  #Weight loss -MVI -Ensure TID  #Labs -lipid panel, TSH and [facility]1Care normal -EKGreviewed, NSR with QTc 423 -head CT scan is negative -pregnancy test negative  #Disposition -discharge with family -follow up with [facility]    Kristine Linea, MD 02/19/2018, 1:05 PM

## 2018-02-18 NOTE — Plan of Care (Signed)
Although patient verbalizes understanding of the information that's been provided to her she continues to ask this writer about discharge and states "get me my paperwork, ain't nothing wrong with me, let's go to the door". Patient denies SI/HI/AVH as well as any signs/symptoms of depression/anxiety, however, she does appear to be responding to internal stimuli by being observed having conversations with herself in the hallway and in her room. Patient has been out in the milieu for meals working towards maintaining adequate nutrition. Patient verbalizes understanding of and has been in compliance with her prescribed therapeutic regimen thus far and all  Question/concerns have been addressed/answered at this time.  Patient did attend one unit group today. Patient's goal for today is to "get the hell up out of here". Patient has the ability to redirect her anger into socially appropriate behaviors. Patient has remained free from injury, but has not participated in any self-care today. Patient remains safe on the unit at this time.  Problem: Education: Goal: Emotional status will improve Outcome: Progressing Goal: Mental status will improve Outcome: Progressing Goal: Verbalization of understanding the information provided will improve Outcome: Progressing   Problem: Activity: Goal: Will verbalize the importance of balancing activity with adequate rest periods Outcome: Progressing   Problem: Education: Goal: Will be free of psychotic symptoms Outcome: Progressing Goal: Knowledge of the prescribed therapeutic regimen will improve Outcome: Progressing   Problem: Coping: Goal: Coping ability will improve Outcome: Progressing Goal: Will verbalize feelings Outcome: Progressing   Problem: Health Behavior/Discharge Planning: Goal: Compliance with prescribed medication regimen will improve Outcome: Progressing   Problem: Nutritional: Goal: Ability to achieve adequate nutritional intake will  improve Outcome: Progressing   Problem: Role Relationship: Goal: Ability to communicate needs accurately will improve Outcome: Progressing Goal: Ability to interact with others will improve Outcome: Progressing   Problem: Safety: Goal: Ability to redirect hostility and anger into socially appropriate behaviors will improve Outcome: Progressing Goal: Ability to remain free from injury will improve Outcome: Progressing   Problem: Self-Care: Goal: Ability to participate in self-care as condition permits will improve Outcome: Progressing   Problem: Self-Concept: Goal: Will verbalize positive feelings about self Outcome: Progressing   Problem: Safety: Goal: Ability to remain free from injury will improve Outcome: Progressing

## 2018-02-18 NOTE — Progress Notes (Signed)
Recreation Therapy Notes  Date: 02/18/2018  Time: 9:30 am   Location: Craft Room   Behavioral response: N/A   Intervention Topic:  Relaxation  Discussion/Intervention: Patient did not attend group.   Clinical Observations/Feedback:  Patient did not attend group.   Conny Moening LRT/CTRS        Samanyu Tinnell 02/18/2018 10:45 AM

## 2018-02-18 NOTE — [facility] Group Notes (Signed)
CSW Group Therapy Note  02/18/2018  Time:  0900  Type of Therapy and Topic: Group Therapy: Goals Group: SMART Goals    Participation Level:  Active    Description of Group:   The purpose of a daily goals group is to assist and guide patients in setting recovery/wellness-related goals. The objective is to set goals as they relate to the crisis in which they were admitted. Patients will be using SMART goal modalities to set measurable goals. Characteristics of realistic goals will be discussed and patients will be assisted in setting and processing how one will reach their goal. Facilitator will also assist patients in applying interventions and coping skills learned in psycho-education groups to the SMART goal and process how one will achieve defined goal.    Therapeutic Goals:  -Patients will develop and document one goal related to or their crisis in which brought them into treatment.  -Patients will be guided by LCSW using SMART goal setting modality in how to set a measurable, attainable, realistic and time sensitive goal.  -Patients will process barriers in reaching goal.  -Patients will process interventions in how to overcome and successful in reaching goal.    Patient's Goal:  Pt continues to work towards their tx goals but has not yet reached them. Pt was able to appropriately participate in group discussion, and was able to offer support/validation to other group members. Pt reported her goal for the day is, "to be somebody by practicing at least three coping skills by the end of today."   Therapeutic Modalities:  Motivational Interviewing  Cognitive Behavioral Therapy  Crisis Intervention Model  SMART goals setting  Heidi DachKelsey Shaquandra Galano, MSW, LCSW Clinical Social Worker 02/18/2018 9:34 AM

## 2018-02-18 NOTE — [facility] Group Notes (Signed)
02/18/2018 1PM  Type of Therapy/Topic:  Group Therapy:  Feelings about Diagnosis  Participation Level:  None   Description of Group:   This group will allow patients to explore their thoughts and feelings about diagnoses they have received. Patients will be guided to explore their level of understanding and acceptance of these diagnoses. Facilitator will encourage patients to process their thoughts and feelings about the reactions of others to their diagnosis and will guide patients in identifying ways to discuss their diagnosis with significant others in their lives. This group will be process-oriented, with patients participating in exploration of their own experiences, giving and receiving support, and processing challenge from other group members.   Therapeutic Goals: 1. Patient will demonstrate understanding of diagnosis as evidenced by identifying two or more symptoms of the disorder 2. Patient will be able to express two feelings regarding the diagnosis 3. Patient will demonstrate their ability to communicate their needs through discussion and/or role play  Summary of Patient Progress: Patient practiced active listening when interacting with the facilitator and other group members. Pt. Did not speak during the group but seemed to be attentive to the information presented. Patient is still in the process of obtaining treatment goals.        Therapeutic Modalities:   Cognitive Behavioral Therapy Brief Therapy Feelings Identification    Johny ShearsCassandra  Jerritt Cardoza, Alexander MtLCSW 02/18/2018 2:43 PM

## 2018-02-18 NOTE — Progress Notes (Signed)
D- Patient alert and oriented. Patient presents in a pleasant mood on assessment stating that she slept "good" last night. Patient had no major complaints at this time. Patient denies SI, HI, AVH, and pain at this time. Patient has been observed responding to internal stimuli in her room by having conversations with herself. Patient also denies any signs/symptoms of depression and anxiety. Patient's goal for today is to "get the hell up out of here".  A- Scheduled medications administered to patient, per MD orders. Support and encouragement provided.  Routine safety checks conducted every 15 minutes.  Patient informed to notify staff with problems or concerns.  R- No adverse drug reactions noted. Patient contracts for safety at this time. Patient compliant with medications and treatment plan. Patient receptive, calm, and cooperative. Patient interacts well with others on the unit.  Patient remains safe at this time.

## 2018-02-19 NOTE — Progress Notes (Signed)
Recreation Therapy Notes  Date: 02/19/2018  Time: 9:30 am   Location: Craft Room   Behavioral response: N/A   Intervention Topic:  Goals  Discussion/Intervention: Patient did not attend group.   Clinical Observations/Feedback:  Patient did not attend group.   [name] LRT/CTRS        Jenna Bell 02/19/2018 11:19 AM 

## 2018-02-19 NOTE — Plan of Care (Signed)
Visible in the milieu today, approached me and said that the outgoing nurse "said that my papers will be ready and I am ready to go now.." She was informed that the disposition decision is still currently under review by the Physician; distracted, conversation refocused to her ADL and hygiene upkeep, new scrubs, towels and wash cloths and hygiene products provided; patient washed up, sloppy job but better; socialized in the Day Room though with limited interaction with peers.  Patient slept for Estimated Hours of 6.30; Precautionary checks every 15 minutes for safety maintained, room free of safety hazards, patient sustains no injury or falls during this shift.  Problem: Education: Goal: Emotional status will improve Outcome: Progressing Goal: Mental status will improve Outcome: Progressing Goal: Verbalization of understanding the information provided will improve Outcome: Progressing   Problem: Role Relationship: Goal: Ability to interact with others will improve Outcome: Progressing   Problem: Safety: Goal: Ability to remain free from injury will improve Outcome: Progressing   Problem: Self-Concept: Goal: Will verbalize positive feelings about self Outcome: Progressing

## 2018-02-19 NOTE — Progress Notes (Signed)
Recreation Therapy Notes  Date: 02/19/2018  Time: 3:00pm  Location: Craft room  Behavioral response: N/A  Group Type: Game  Participation level: N/A  Communication: Patient did not attend group.  Comments: N/A  Shaquon Gropp LRT/CTRS        Sam Overbeck 02/19/2018 3:58 PM

## 2018-02-19 NOTE — Plan of Care (Signed)
Patient present in the milieu more with minimal interaction with her peers. Patient is involved more in the group settings, and is able to verbalize her feelings to staff. Denies hearing voices but continues to respond to internal stimuli, observed talking to herself when walking down the hall and when in her room. Milieu remains safe with q 15 minute safety checks. Will continue to monitor.

## 2018-02-20 DIAGNOSIS — F89 Unspecified disorder of psychological development: Secondary | ICD-10-CM | POA: Diagnosis present

## 2018-02-20 MED ORDER — ARIPIPRAZOLE 10 MG PO TABS
10.0000 mg | ORAL_TABLET | Freq: Every day | ORAL | Status: DC
  Administered 2018-02-21: 10 mg via ORAL
  Filled 2018-02-20: qty 1

## 2018-02-20 NOTE — Progress Notes (Addendum)
[facility] MD Progress Note  02/21/2018 7:30 PM Rickell Wiehe  MRN:  [id number]  Subjective:    Ms. Heitman is more in control of her behavior to a point. Due to psychotic disorganization, paranoia and hallucinations, she has not been able to process information and gets frustrated. She rejects diagnosis of mental illness or need for medications. Even though, she has been compliant in the hospital, she has no intention to continue. Her resolve has been strengthened by the fact that her brother did not do well on medications (details unknown) and her therapist at [facility] psychology department assured her that she needs individual psychotherapy only. She however agreed to take Abilify maintena injections. She loses her temper at the end of each interaction but now only storms out of the office. She interacts more with peer and participates in programing.  Interestingly, in our conversations, she always puts forward diagnosis of intellectual disability and is rather comfortable with it. Indeed, she has been functioning well: job, relationship, home, Cabin crew and a car, up until [place].  Principal Problem: Bipolar I disorder, current or most recent episode manic, with psychotic features (HCC) Diagnosis:   Patient Active Problem List   Diagnosis Date Noted  . Bipolar I disorder, current or most recent episode manic, with psychotic features (HCC) [F31.2] 02/11/2018    Priority: High  . Developmental disability [F89] 02/20/2018  . Tobacco use disorder [F17.200] 02/12/2018  . Cannabis use disorder, moderate, dependence (HCC) [F12.20] 02/12/2018  . UTI (urinary tract infection) [N39.0] 02/12/2018   Total Time spent with patient: 20 minutes  Past Psychiatric History: intellectual disability  Past Medical History:  Past Medical History:  Diagnosis Date  . Bipolar disorder (HCC)    History reviewed. No pertinent surgical history. Family History: History reviewed. No pertinent family  history. Family Psychiatric  History: brother with mental illness Social History:  Social History   Substance and Sexual Activity  Alcohol Use Yes  . Alcohol/week: 1.2 oz  . Types: 2 Glasses of wine per week  . Frequency: Never     Social History   Substance and Sexual Activity  Drug Use Yes  . Types: Methamphetamines    Social History   Socioeconomic History  . Marital status: Single    Spouse name: Not on file  . Number of children: Not on file  . Years of education: Not on file  . Highest education level: Not on file  Occupational History  . Not on file  Social Needs  . Financial resource strain: Not on file  . Food insecurity:    Worry: Not on file    Inability: Not on file  . Transportation needs:    Medical: Not on file    Non-medical: Not on file  Tobacco Use  . Smoking status: Light Tobacco Smoker    Packs/day: 0.25  . Smokeless tobacco: Never Used  Substance and Sexual Activity  . Alcohol use: Yes    Alcohol/week: 1.2 oz    Types: 2 Glasses of wine per week    Frequency: Never  . Drug use: Yes    Types: Methamphetamines  . Sexual activity: Yes  Lifestyle  . Physical activity:    Days per week: Not on file    Minutes per session: Not on file  . Stress: Not on file  Relationships  . Social connections:    Talks on phone: Not on file    Gets together: Not on file    Attends religious service: Not on  file    Active member of club or organization: Not on file    Attends meetings of clubs or organizations: Not on file    Relationship status: Not on file  Other Topics Concern  . Not on file  Social History Narrative  . Not on file   Additional Social History:                         Sleep: Fair  Appetite:  Fair  Current Medications: Current Facility-Administered Medications  Medication Dose Route Frequency Provider Last Rate Last Dose  . alum & mag hydroxide-simeth (MAALOX/MYLANTA) 200-200-20 MG/5ML suspension 30 mL  30 mL Oral  Q4H PRN Clapacs, John T, MD      . ARIPiprazole ER (ABILIFY MAINTENA) injection 400 mg  400 mg Intramuscular Q28 days Amaris Delafuente B, MD   400 mg at 02/21/18 1502  . cephALEXin (KEFLEX) capsule 500 mg  500 mg Oral Q8H Juston Goheen B, MD      . divalproex (DEPAKOTE) DR tablet 500 mg  500 mg Oral Q12H Joneric Streight B, MD   500 mg at 02/21/18 0838  . feeding supplement (ENSURE ENLIVE) (ENSURE ENLIVE) liquid 237 mL  237 mL Oral TID BM Brandom Kerwin B, MD   237 mL at 02/21/18 1433  . magnesium hydroxide (MILK OF MAGNESIA) suspension 30 mL  30 mL Oral Daily PRN Clapacs, John T, MD      . multivitamin with minerals tablet 1 tablet  1 tablet Oral Daily Addilyne Backs B, MD   1 tablet at 02/21/18 0841  . QUEtiapine (SEROQUEL) tablet 600 mg  600 mg Oral QHS Fia Hebert B, MD   600 mg at 02/20/18 2139  . vitamin B-12 (CYANOCOBALAMIN) tablet 1,000 mcg  1,000 mcg Oral Daily Tacia Hindley B, MD   1,000 mcg at 02/21/18 [id number]    Lab Results: No results found for this or any previous visit (from the past 48 hour(s)).  Blood Alcohol level:  Lab Results  Component Value Date   ETH <10 02/11/2018    Metabolic Disorder Labs: Lab Results  Component Value Date   HGBA1C 5.4 02/12/2018   MPG 108 02/12/2018   No results found for: PROLACTIN Lab Results  Component Value Date   CHOL 170 02/12/2018   TRIG 63 02/12/2018   HDL 46 02/12/2018   CHOLHDL 3.7 02/12/2018   VLDL 13 02/12/2018   LDLCALC 111 (H) 02/12/2018    Physical Findings: AIMS: Facial and Oral Movements Muscles of Facial Expression: None, normal Lips and Perioral Area: None, normal Jaw: None, normal Tongue: None, normal,Extremity Movements Upper (arms, wrists, hands, fingers): None, normal Lower (legs, knees, ankles, toes): None, normal, Trunk Movements Neck, shoulders, hips: None, normal, Overall Severity Severity of abnormal movements (highest score from questions above): None,  normal Incapacitation due to abnormal movements: None, normal Patient's awareness of abnormal movements (rate only patient's report): No Awareness, Dental Status Current problems with teeth and/or dentures?: No Does patient usually wear dentures?: No  CIWA:  CIWA-Ar Total: 3 COWS:  COWS Total Score: 1  Musculoskeletal: Strength & Muscle Tone: within normal limits Gait & Station: normal Patient leans: N/A  Psychiatric Specialty Exam: Physical Exam  Nursing note and vitals reviewed. Psychiatric: Her affect is labile. Her speech is tangential. She is actively hallucinating. Thought content is paranoid and delusional. Cognition and memory are impaired. She expresses impulsivity.    Review of Systems  Neurological: Negative.   Psychiatric/Behavioral: Positive  for hallucinations and substance abuse.  All other systems reviewed and are negative.   Blood pressure 111/73, pulse (!) 108, temperature 97.9 F (36.6 C), temperature source Oral, resp. rate 18, height 5\' 5"  (1.651 m), weight 55.3 kg (122 lb), last menstrual period 02/10/2018, SpO2 100 %.Body mass index is 20.3 kg/m.  General Appearance: Fairly Groomed  Eye Contact:  Fair  Speech:  Clear and Coherent  Volume:  Increased  Mood:  Angry  Affect:  Congruent  Thought Process:  Disorganized, Irrelevant and Descriptions of Associations: Loose  Orientation:  Full (Time, Place, and Person)  Thought Content:  Delusions, Hallucinations: Auditory and Paranoid Ideation  Suicidal Thoughts:  No  Homicidal Thoughts:  No  Memory:  Immediate;   Poor Recent;   Poor Remote;   Poor  Judgement:  Poor  Insight:  Lacking  Psychomotor Activity:  Increased  Concentration:  Concentration: Poor and Attention Span: Poor  Recall:  Poor  Fund of Knowledge:  Fair  Language:  Fair  Akathisia:  No  Handed:  Right  AIMS (if indicated):     Assets:  Communication Skills Desire for Improvement Physical Health Resilience  ADL's:  Intact  Cognition:   WNL  Sleep:  Number of Hours: 7.5     Treatment Plan Summary: Daily contact with patient to assess and evaluate symptoms and progress in treatment and Medication management   Ms. Burkey is a 32 year old female with a history of developmental disability but no mental illness admitted for psychotic break.She was floridly manic on admission. Agitation has resolved but the patient is still paranoid, delusional and hallucinating.   #Agitation, resolved  #Mood and psychosis, improving -continueSeroquel to600 mg nightly for 2 more weeks -tolerates oral Abilify well, give Abilify maintena 400 mg injection today, 7/19 -continue Depakote to 500 mg BID, VPA level in AM  #Insomnia -Trazodone 100 mg nightly  #UTI -Keflex 500 mg , last dose on Saturday  #Substance abuse -positive for cannabis -unable to discuss substance abuse treatment  #Smoking cessation -nicotine patch is available  #B12 defficiency -Vit B12 1000 ug daily  #Weight loss -MVI -Ensure TID  #Labs -lipid panel, TSH and A1Care normal -EKGreviewed, NSR with QTc 423 -head CT scan is negative -pregnancy test negative  #Disposition -homeless -follow up [facility]    Kristine Linea, MD 02/21/2018, 7:30 PM

## 2018-02-20 NOTE — Plan of Care (Addendum)
Disheveled, poor hygiene, visible in the milieu with limited interactions with peers, "the doctor said that my papers will be ready, I supposed to be out of here by now.." She is not responding to internal stimuli during this shift.  Patient slept for Estimated Hours of 8.15; Precautionary checks every 15 minutes for safety maintained, room free of safety hazards, patient sustains no injury or falls during this shift.  Problem: Education: Goal: Emotional status will improve Outcome: Progressing Goal: Mental status will improve Outcome: Progressing Goal: Verbalization of understanding the information provided will improve Outcome: Progressing   Problem: Activity: Goal: Will verbalize the importance of balancing activity with adequate rest periods Outcome: Progressing   Problem: Education: Goal: Will be free of psychotic symptoms Outcome: Progressing   Problem: Safety: Goal: Ability to remain free from injury will improve Outcome: Progressing

## 2018-02-20 NOTE — Progress Notes (Signed)
[facility]  02/20/2018 11:37 AM Jenna Bell  MRN:  [id number]  Subjective:    [name] returns to my office repeatedly to ask for a form that would allow her to live. She has a decent conversation, interrupted by hallucinations, until I asked her about the circumstances of her arrest in February and car situation. This is when she gets upset. She went to jail for 30 days when unable to pay cab driver who took her from [place] to [place]. Apparently, she returned her car to used car parking lot as there was something wrong "with the title". Reportedly, she was unable to pay after she quit her job. 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   At the end, she stormed out of the office. She is unable to participate in discharge planning  Principal Problem: Bipolar I disorder, current or most recent episode manic, with psychotic features [facility](HCC) Diagnosis:   Patient Active Problem List   Diagnosis Date Noted  . Bipolar I disorder, current or most recent episode manic, with psychotic features (HCC) [F31.2] 02/11/2018    Priority: High  . Developmental disability [F89] 02/20/2018  . Tobacco use disorder [F17.200] 02/12/2018  . Cannabis use disorder, moderate, dependence (HCC) [F12.20] 02/12/2018  . UTI (urinary tract infection) [N39.0] 02/12/2018   Total Time spent with patient: 20 minutes  Past Psychiatric History: developmental disability  Past Medical History:  Past Medical History:  Diagnosis Date  . Bipolar disorder (HCC)    History reviewed. No pertinent surgical history. Family History: History reviewed. No pertinent family history. Family Psychiatric  History: brother with mental illness Social History:  Social History   Substance and Sexual Activity  Alcohol Use Yes  . Alcohol/week: 1.2 oz  . Types: 2 Glasses of wine per week  . Frequency: Never     Social History   Substance and Sexual Activity  Drug Use Yes  .  Types: Methamphetamines    Social History   Socioeconomic History  . Marital status: Single    Spouse name: Not on file  . Number of children: Not on file  . Years of education: Not on file  . Highest education level: Not on file  Occupational History  . Not on file  Social Needs  . Financial resource strain: Not on file  . Food insecurity:    Worry: Not on file    Inability: Not on file  . Transportation needs:    Medical: Not on file    Non-medical: Not on file  Tobacco Use  . Smoking status: Light Tobacco Smoker    Packs/day: 0.25  . Smokeless tobacco: Never Used  Substance and Sexual Activity  . Alcohol use: Yes    Alcohol/week: 1.2 oz    Types: 2 Glasses of wine per week    Frequency: Never  . Drug use: Yes    Types: Methamphetamines  . Sexual activity: Yes  Lifestyle  . Physical activity:    Days per week: Not on file    Minutes per session: Not on file  . Stress: Not on file  Relationships  . Social connections:    Talks on phone: Not on file    Gets together: Not on file    Attends religious service: Not on file    Active member of club or organization: Not on file    Attends meetings of clubs or organizations: Not on file    Relationship status: Not on  file  Other Topics Concern  . Not on file  Social History Narrative  . Not on file   Additional Social History:                         Sleep: Fair  Appetite:  Fair  Current Medications: Current Facility-Administered Medications  Medication Dose Route Frequency Provider Last Rate Last Dose  . alum & mag hydroxide-simeth (MAALOX/MYLANTA) 200-200-20 MG/5ML suspension 30 mL  30 mL Oral Q4H PRN Clapacs, John T, MD      . cephALEXin (KEFLEX) capsule 500 mg  500 mg Oral Q8H Sherisa Gilvin B, MD   500 mg at 02/20/18 0620  . diphenhydrAMINE (BENADRYL) capsule 50 mg  50 mg Oral Q6H PRN Holdan Stucke B, MD   50 mg at 02/19/18 1245   Or  . diphenhydrAMINE (BENADRYL) injection 50 mg  50  mg Intramuscular Q6H PRN Erum Cercone B, MD   50 mg at 02/13/18 1009  . divalproex (DEPAKOTE) DR tablet 500 mg  500 mg Oral Q12H Ryman Rathgeber B, MD   500 mg at 02/20/18 0753  . feeding supplement (ENSURE ENLIVE) (ENSURE ENLIVE) liquid 237 mL  237 mL Oral TID BM Averee Harb B, MD   237 mL at 02/20/18 0943  . haloperidol (HALDOL) tablet 10 mg  10 mg Oral Q6H PRN Araceli Coufal B, MD   10 mg at 02/19/18 1245   Or  . haloperidol lactate (HALDOL) injection 10 mg  10 mg Intramuscular Q6H PRN Veryl Winemiller B, MD   10 mg at 02/13/18 1009  . hydrOXYzine (ATARAX/VISTARIL) tablet 50 mg  50 mg Oral TID PRN Clapacs, Jackquline Denmark, MD   50 mg at 02/16/18 2017  . LORazepam (ATIVAN) tablet 2 mg  2 mg Oral Q4H PRN Ilze Roselli B, MD   2 mg at 02/19/18 1245   Or  . LORazepam (ATIVAN) injection 2 mg  2 mg Intramuscular Q4H PRN Lylah Lantis B, MD   2 mg at 02/13/18 1005  . magnesium hydroxide (MILK OF MAGNESIA) suspension 30 mL  30 mL Oral Daily PRN Clapacs, John T, MD      . multivitamin with minerals tablet 1 tablet  1 tablet Oral Daily Hesston Hitchens B, MD   1 tablet at 02/20/18 0753  . QUEtiapine (SEROQUEL) tablet 600 mg  600 mg Oral QHS Kyrielle Urbanski B, MD   600 mg at 02/19/18 2116  . vitamin B-12 (CYANOCOBALAMIN) tablet 1,000 mcg  1,000 mcg Oral Daily Shelaine Frie B, MD   1,000 mcg at 02/20/18 0753    Lab Results: No results found for this or any previous visit (from the past 48 hour(s)).  Blood Alcohol level:  Lab Results  Component Value Date   ETH <10 02/11/2018    Metabolic Disorder Labs: Lab Results  Component Value Date   HGBA1C 5.4 02/12/2018   MPG 108 02/12/2018   No results found for: PROLACTIN Lab Results  Component Value Date   CHOL 170 02/12/2018   TRIG 63 02/12/2018   HDL 46 02/12/2018   CHOLHDL 3.7 02/12/2018   VLDL 13 02/12/2018   LDLCALC 111 (H) 02/12/2018    Physical Findings: AIMS: Facial and Oral  Movements Muscles of Facial Expression: None, normal Lips and Perioral Area: None, normal Jaw: None, normal Tongue: None, normal,Extremity Movements Upper (arms, wrists, hands, fingers): None, normal Lower (legs, knees, ankles, toes): None, normal, Trunk Movements Neck, shoulders, hips: None, normal, Overall Severity Severity  of abnormal movements (highest score from questions above): None, normal Incapacitation due to abnormal movements: None, normal Patient's awareness of abnormal movements (rate only patient's report): No Awareness, Dental Status Current problems with teeth and/or dentures?: No Does patient usually wear dentures?: No  CIWA:  CIWA-Ar Total: 3 COWS:  COWS Total Score: 1  Musculoskeletal: Strength & Muscle Tone: within normal limits Gait & Station: normal Patient leans: N/A  Psychiatric Specialty Exam: Physical Exam  Nursing note and vitals reviewed. Psychiatric: Her affect is angry, labile and inappropriate. Her speech is rapid and/or pressured. She is actively hallucinating. Thought content is paranoid. Cognition and memory are impaired. She expresses impulsivity.    Review of Systems  Neurological: Negative.   Psychiatric/Behavioral: Positive for hallucinations.  All other systems reviewed and are negative.   Blood pressure 113/70, pulse (!) 110, temperature 98.1 F (36.7 C), temperature source Oral, resp. rate 18, height 5\' 5"  (1.651 m), weight 55.3 kg (122 lb), last menstrual period 02/10/2018, SpO2 100 %.Body mass index is 20.3 kg/m.  General Appearance: Disheveled  Eye Contact:  Good  Speech:  Pressured  Volume:  Increased  Mood:  Angry, Dysphoric and Irritable  Affect:  Inappropriate and Labile  Thought Process:  Disorganized, Irrelevant and Descriptions of Associations: Tangential  Orientation:  Full (Time, Place, and Person)  Thought Content:  Hallucinations: Auditory and Paranoid Ideation  Suicidal Thoughts:  No  Homicidal Thoughts:  No   Memory:  Immediate;   Poor Recent;   Poor Remote;   Poor  Judgement:  Poor  Insight:  Lacking  Psychomotor Activity:  Increased  Concentration:  Concentration: Poor and Attention Span: Poor  Recall:  [employer] of Knowledge:  Fair  Language:  Fair  Akathisia:  No  Handed:  Right  AIMS (if indicated):     Assets:  Communication Skills Desire for Improvement Physical Health Resilience  ADL's:  Intact  Cognition:  WNL  Sleep:  Number of Hours: 8.15     Treatment Plan Summary: Daily contact with patient to assess and evaluate symptoms and progress in treatment and Medication management   Ms. Seeman is a 32 year old female with a history of developmental disability but no mental illness admitted for psychotic break.She was floridly manic on admission. Agitation has resolved but the patient is still paranoid, delusional and hallucinating.   #Agitation, resolved  #Mood and psychosis -continueSeroquel to600 mg nightly -give oral Abilify to transition to injectable Abilify maintena -patient unable to have a discussion about injectables -continue Depakote to 500 mg BID, VPA level in AM  #Insomnia -Trazodone 100 mg nightly  #UTI -Keflex 500 mg TID  #Substance abuse -positive for cannabis -unable to discuss substance abuse treatment  #Smoking cessation -nicotine patch is available  #B12 defficiency -Vit B12 1000 ug daily  #Weight loss -MVI -Ensure TID  #Labs -lipid panel, TSH and A1Care normal -EKGreviewed, NSR with QTc 423 -head CT scan is negative -pregnancy test negative  #Disposition -discharge with family -follow up with [facility]    Kristine Linea, MD 02/20/2018, 11:37 AM

## 2018-02-20 NOTE — B[facility] Group Notes (Signed)
  02/20/2018  Time: 1PM  Type of Therapy/Topic:  Group Therapy:  Balance in Life  Participation Level:  Did Not Attend  Description of Group:   This group will address the concept of balance and how it feels and looks when one is unbalanced. Patients will be encouraged to process areas in their lives that are out of balance and identify reasons for remaining unbalanced. Facilitators will guide patients in utilizing problem-solving interventions to address and correct the stressor making their life unbalanced. Understanding and applying boundaries will be explored and addressed for obtaining and maintaining a balanced life. Patients will be encouraged to explore ways to assertively make their unbalanced needs known to significant others in their lives, using other group members and facilitator for support and feedback.  Therapeutic Goals: 1. Patient will identify two or more emotions or situations they have that consume much of in their lives. 2. Patient will identify signs/triggers that life has become out of balance:  3. Patient will identify two ways to set boundaries in order to achieve balance in their lives:  4. Patient will demonstrate ability to communicate their needs through discussion and/or role plays  Summary of Patient Progress: Pt was invited to attend group but chose not to attend. CSW will continue to encourage pt to attend group throughout their admission.     Therapeutic Modalities:   Cognitive Behavioral Therapy Solution-Focused Therapy Assertiveness Training  Gervis Gaba, MSW, LCSW Clinical Social Worker 02/20/2018 2:14 PM   

## 2018-02-20 NOTE — BHH Group Notes (Signed)
LCSW Group Therapy Note 02/20/2018 9:00 AM  Type of Therapy and Topic:  Group Therapy:  Setting Goals  Participation Level:  Did Not Attend  Description of Group: In this process group, patients discussed using strengths to work toward goals and address challenges.  Patients identified two positive things about themselves and one goal they were working on.  Patients were given the opportunity to share openly and support each other's plan for self-empowerment.  The group discussed the value of gratitude and were encouraged to have a daily reflection of positive characteristics or circumstances.  Patients were encouraged to identify a plan to utilize their strengths to work on current challenges and goals.  Therapeutic Goals 1. Patient will verbalize personal strengths/positive qualities and relate how these can assist with achieving desired personal goals 2. Patients will verbalize affirmation of peers plans for personal change and goal setting 3. Patients will explore the value of gratitude and positive focus as related to successful achievement of goals 4. Patients will verbalize a plan for regular reinforcement of personal positive qualities and circumstances.  Summary of Patient Progress: Jenna ArvinLatoya was invited to today's group, but chose not to attend.      Therapeutic Modalities Cognitive Behavioral Therapy Motivational Interviewing    Jenna FrameSonya S Barbie Bell, [place] 02/20/2018 11:42 AM

## 2018-02-20 NOTE — Progress Notes (Signed)
Recreation Therapy Notes  Date: 02/20/2018  Time: 9:30 am   Location: Craft Room   Behavioral response: N/A   Intervention Topic: Problem Solving  Discussion/Intervention: Patient did not attend group.   Clinical Observations/Feedback:  Patient did not attend group.   Joangel Vanosdol LRT/CTRS        Sagrario Lineberry 02/20/2018 12:35 PM 

## 2018-02-20 NOTE — Plan of Care (Signed)
Patient denies SI/HI/AVH. Patient is discharged focus. Patient is apprehensive to care and is asking multiple times about discharge paperwork. Patient denies any Depression, Anxiety, and hopelessness. Patient is redirectable. Patient is seen in milieu but is minimal with peers. Patient able to participate in care and is compliant with medication at this time. Patient denies any pain during assessment. Safety is maintained this shift.    Problem: Education: Goal: Knowledge of the prescribed therapeutic regimen will improve Outcome: Progressing   Problem: Role Relationship: Goal: Ability to communicate needs accurately will improve Outcome: Progressing Goal: Ability to interact with others will improve Outcome: Progressing   Problem: Safety: Goal: Ability to remain free from injury will improve Outcome: Progressing   Problem: Education: Goal: Knowledge of [facility] Education information/materials will improve Outcome: Not Progressing Goal: Emotional status will improve Outcome: Not Progressing Goal: Mental status will improve Outcome: Not Progressing   Problem: Education: Goal: Will be free of psychotic symptoms Outcome: Not Progressing

## 2018-02-20 NOTE — Progress Notes (Signed)
Recreation Therapy Notes  Date: 02/20/2018  Time: 3:00pm  Location: Craft room  Behavioral response: N/A  Group Type: Craft  Participation level: N/A  Communication: Patient did not attend group.  Comments: N/A  Correen Bubolz LRT/CTRS        Eleazar Kimmey 02/20/2018 4:16 PM

## 2018-02-21 MED ORDER — CEPHALEXIN 500 MG PO CAPS
500.0000 mg | ORAL_CAPSULE | Freq: Three times a day (TID) | ORAL | Status: AC
  Administered 2018-02-21 – 2018-02-22 (×3): 500 mg via ORAL
  Filled 2018-02-21 (×3): qty 1

## 2018-02-21 MED ORDER — ARIPIPRAZOLE ER 400 MG IM SRER
400.0000 mg | INTRAMUSCULAR | Status: DC
  Administered 2018-02-21: 400 mg via INTRAMUSCULAR
  Filled 2018-02-21: qty 2

## 2018-02-21 NOTE — Tx Team (Signed)
Interdisciplinary Treatment and Diagnostic Plan Update  02/21/2018 Time of Session: 10:30am Jenna Bell MRN: [id number]  Principal Diagnosis: Bipolar I disorder, current or most recent episode manic, with psychotic features (HCC)  Secondary Diagnoses: Principal Problem:   Bipolar I disorder, current or most recent episode manic, with psychotic features (HCC) Active Problems:   Tobacco use disorder   Cannabis use disorder, moderate, dependence (HCC)   UTI (urinary tract infection)   Developmental disability   Current Medications:  Current Facility-Administered Medications  Medication Dose Route Frequency Provider Last Rate Last Dose  . alum & mag hydroxide-simeth (MAALOX/MYLANTA) 200-200-20 MG/5ML suspension 30 mL  30 mL Oral Q4H PRN Clapacs, John T, MD      . ARIPiprazole ER (ABILIFY MAINTENA) injection 400 mg  400 mg Intramuscular Q28 days Pucilowska, Jolanta B, MD      . cephALEXin (KEFLEX) capsule 500 mg  500 mg Oral Q8H Pucilowska, Jolanta B, MD   500 mg at 02/21/18 0612  . divalproex (DEPAKOTE) DR tablet 500 mg  500 mg Oral Q12H Pucilowska, Jolanta B, MD   500 mg at 02/21/18 0838  . feeding supplement (ENSURE ENLIVE) (ENSURE ENLIVE) liquid 237 mL  237 mL Oral TID BM Pucilowska, Jolanta B, MD   237 mL at 02/21/18 1027  . magnesium hydroxide (MILK OF MAGNESIA) suspension 30 mL  30 mL Oral Daily PRN Clapacs, John T, MD      . multivitamin with minerals tablet 1 tablet  1 tablet Oral Daily Pucilowska, Jolanta B, MD   1 tablet at 02/21/18 0841  . QUEtiapine (SEROQUEL) tablet 600 mg  600 mg Oral QHS Pucilowska, Jolanta B, MD   600 mg at 02/20/18 2139  . vitamin B-12 (CYANOCOBALAMIN) tablet 1,000 mcg  1,000 mcg Oral Daily Pucilowska, Jolanta B, MD   1,000 mcg at 02/21/18 0837   PTA Medications: No medications prior to admission.    Patient Stressors: Medication change or noncompliance Substance abuse  Patient Strengths: Motivation for treatment/growth Supportive  family/friends  Treatment Modalities: Medication Management, Group therapy, Case management,  1 to 1 session with clinician, Psychoeducation, Recreational therapy.   Physician Treatment Plan for Primary Diagnosis: Bipolar I disorder, current or most recent episode manic, with psychotic features (HCC) Long Term Goal(s): Improvement in symptoms so as ready for discharge Improvement in symptoms so as ready for discharge   Short Term Goals: Ability to identify changes in lifestyle to reduce recurrence of condition will improve Ability to verbalize feelings will improve Ability to disclose and discuss suicidal ideas Ability to demonstrate self-control will improve Ability to identify and develop effective coping behaviors will improve Ability to maintain clinical measurements within normal limits will improve Compliance with prescribed medications will improve Ability to identify changes in lifestyle to reduce recurrence of condition will improve Ability to demonstrate self-control will improve Ability to identify triggers associated with substance abuse/mental health issues will improve  Medication Management: Evaluate patient's response, side effects, and tolerance of medication regimen.  Therapeutic Interventions: 1 to 1 sessions, Unit Group sessions and Medication administration.  Evaluation of Outcomes: Progressing  Physician Treatment Plan for Secondary Diagnosis: Principal Problem:   Bipolar I disorder, current or most recent episode manic, with psychotic features (HCC) Active Problems:   Tobacco use disorder   Cannabis use disorder, moderate, dependence (HCC)   UTI (urinary tract infection)   Developmental disability  Long Term Goal(s): Improvement in symptoms so as ready for discharge Improvement in symptoms so as ready for discharge   Short  Term Goals: Ability to identify changes in lifestyle to reduce recurrence of condition will improve Ability to verbalize feelings will  improve Ability to disclose and discuss suicidal ideas Ability to demonstrate self-control will improve Ability to identify and develop effective coping behaviors will improve Ability to maintain clinical measurements within normal limits will improve Compliance with prescribed medications will improve Ability to identify changes in lifestyle to reduce recurrence of condition will improve Ability to demonstrate self-control will improve Ability to identify triggers associated with substance abuse/mental health issues will improve     Medication Management: Evaluate patient's response, side effects, and tolerance of medication regimen.  Therapeutic Interventions: 1 to 1 sessions, Unit Group sessions and Medication administration.  Evaluation of Outcomes: Progressing   RN Treatment Plan for Primary Diagnosis: Bipolar I disorder, current or most recent episode manic, with psychotic features (HCC) Long Term Goal(s): Knowledge of disease and therapeutic regimen to maintain health will improve  Short Term Goals: Ability to participate in decision making will improve, Ability to verbalize feelings will improve, Ability to identify and develop effective coping behaviors will improve and Compliance with prescribed medications will improve  Medication Management: RN will administer medications as ordered by provider, will assess and evaluate patient's response and provide education to patient for prescribed medication. RN will report any adverse and/or side effects to prescribing provider.  Therapeutic Interventions: 1 on 1 counseling sessions, Psychoeducation, Medication administration, Evaluate responses to treatment, Monitor vital signs and CBGs as ordered, Perform/monitor CIWA, COWS, AIMS and Fall Risk screenings as ordered, Perform wound care treatments as ordered.  Evaluation of Outcomes: Progressing   LCSW Treatment Plan for Primary Diagnosis: Bipolar I disorder, current or most recent  episode manic, with psychotic features (HCC) Long Term Goal(s): Safe transition to appropriate next level of care at discharge, Engage patient in therapeutic group addressing interpersonal concerns.  Short Term Goals: Engage patient in aftercare planning with referrals and resources, Increase social support, Facilitate acceptance of mental health diagnosis and concerns, Identify triggers associated with mental health/substance abuse issues and Increase skills for wellness and recovery  Therapeutic Interventions: Assess for all discharge needs, 1 to 1 time with Social worker, Explore available resources and support systems, Assess for adequacy in community support network, Educate family and significant other(s) on suicide prevention, Complete Psychosocial Assessment, Interpersonal group therapy.  Evaluation of Outcomes: Progressing   Progress in Treatment: Attending groups: Yes. Participating in groups: Yes. Taking medication as prescribed: Yes. Toleration medication: Yes. Family/Significant other contact made: No, will contact:  Patients mother Chamille Werntz Patient understands diagnosis: No. Discussing patient identified problems/goals with staff: Yes. Medical problems stabilized or resolved: Yes. Denies suicidal/homicidal ideation: Yes. Issues/concerns per patient self-inventory: No. Other:   New problem(s) identified: No, Describe:  None  New Short Term/Long Term Goal(s): No goal  Patient Goals:  No goal  [place] with [place].  Reason for Continuation of Hospitalization: Medication stabilization  Estimated Length of Stay: 7 days  Recreational Therapy: Patient Stressors: N/A Patient Goal: Patient will engage in interactions with peers and staff in pro-social manner at least 2x within 5 recreation therapy group sessions  Attendees: Patient:  02/21/2018 11:55 AM  Physician: Dr. Jennet Maduro,  MD 02/21/2018 11:55 AM  Nursing: Leonia Reader, RN 02/21/2018 11:55 AM  RN Care Manager: 02/21/2018 11:55 AM  Social Worker: [name], LCSWA 02/21/2018 11:55 AM  Recreational Therapist: Danella Deis. Dreama Saa, LRT 02/21/2018 11:55 AM  Other: Heidi Dach,  LCSW 02/21/2018 11:55 AM  Other: Huey Romans, LCSW 02/21/2018 11:55 AM  Other:  02/21/2018 11:55 AM    Scribe for Treatment Team: [name], LCSW 02/21/2018 11:55 AM

## 2018-02-21 NOTE — [facility] Group Notes (Signed)
02/21/2018 1PM  Type of Therapy and Topic:  Group Therapy:  Feelings around Relapse and Recovery  Participation Level:  Active   Description of Group:    Patients in this group will discuss emotions they experience before and after a relapse. They will process how experiencing these feelings, or avoidance of experiencing them, relates to having a relapse. Facilitator will guide patients to explore emotions they have related to recovery. Patients will be encouraged to process which emotions are more powerful. They will be guided to discuss the emotional reaction significant others in their lives may have to patients' relapse or recovery. Patients will be assisted in exploring ways to respond to the emotions of others without this contributing to a relapse.  Therapeutic Goals: 1. Patient will identify two or more emotions that lead to a relapse for them 2. Patient will identify two emotions that result when they relapse 3. Patient will identify two emotions related to recovery 4. Patient will demonstrate ability to communicate their needs through discussion and/or role plays   Summary of Patient Progress: Actively and appropriately engaged in the group. Patient was able to provide support and validation to other group members.Patient practiced active listening when interacting with the facilitator and other group members. Pt. Reports a coping skill that she can use while working towards recovery is "walking and dancing". Patient is still in the process of obtaining treatment goals.      Therapeutic Modalities:   Cognitive Behavioral Therapy Solution-Focused Therapy Assertiveness Training Relapse Prevention Therapy   Jenna ShearsCassandra  Izayah Bell, Alexander MtLCSW 02/21/2018 2:31 PM

## 2018-02-21 NOTE — Progress Notes (Signed)
Abilify injection administered in L) forearm. Education provided to patient prior to administration. No adverse reactions noted at this point.

## 2018-02-21 NOTE — Plan of Care (Signed)
Patient continues to improve socially and not isolative to her room; areas of improvements still includes: ADL upkeep, personal hygiene, insight to the disease processes; still asking for discharge papers. Medication compliant.  Patient slept for Estimated Hours of 7.30; Precautionary checks every 15 minutes for safety maintained, room free of safety hazards, patient sustains no injury or falls during this shift.  Problem: Education: Goal: Emotional status will improve Outcome: Progressing Goal: Mental status will improve Outcome: Progressing Goal: Verbalization of understanding the information provided will improve Outcome: Progressing   Problem: Coping: Goal: Will verbalize feelings Outcome: Progressing   Problem: Nutritional: Goal: Ability to achieve adequate nutritional intake will improve Outcome: Progressing   Problem: Safety: Goal: Ability to remain free from injury will improve Outcome: Progressing

## 2018-02-21 NOTE — Progress Notes (Signed)
Recreation Therapy Notes   Date: 02/21/2018  Time: 9:30 am  Location: Craft Room  Behavioral response: Appropriate    Intervention Topic: Leisure  Discussion/Intervention:   Group content today was focused on leisure. The group defined what leisure is and some positive leisure activities they participate in. Individuals identified the difference between good and bad leisure. Participants expressed how they feel after participating in the leisure of their choice. The group discussed how they go about picking a leisure activity and if others are involved in their leisure activities. The patient stated how many leisure activities they too choose from and reasons why it is important to have leisure time. Individuals participated in the intervention "Exploration of Leisure" where they had a chance to identify new leisure activities as well as benefits of leisure.  Clinical Observations/Feedback:  Patient came to group and stated bad leisure is when you do bad stuff. Individual was social with peers and staff while participating in group.  Rena Sweeden LRT/CTRS         Kamauri Kathol 02/21/2018 12:30 PM

## 2018-02-21 NOTE — [facility] Group Notes (Signed)
[facility] Group Notes:  (Nursing/MHT/Case Management/Adjunct)  Date:  02/21/2018  Time:  2:56 PM  Type of Therapy:  Psychoeducational Skills  Participation Level:  Did Not Attend  Lynelle SmokeCara Travis [facility] L PMadoni 02/21/2018, 2:56 PM

## 2018-02-22 LAB — VALPROIC ACID LEVEL: VALPROIC ACID LVL: 74 ug/mL (ref 50.0–100.0)

## 2018-02-22 NOTE — Progress Notes (Signed)
[facility] MD Progress Note  02/22/2018 2:15 PM Jenna Bell  MRN:  [id number] Subjective: Follow-up for this patient with what appears to be bipolar mania.  Patient wanted to talk with me about how important it was to be discharged.  Has no insight into her illness.  He mentions the bit to me about intellectual disability as well.  I do not think she knows what that means.  Patient has been more compliant with her medicine and at least was not hostile or shouting with me.  Did not make any grossly bizarre statements although still at times seems to be very disorganized in her thinking on the ward.  Agitation has calm down.  No new physical complaints. Principal Problem: Bipolar I disorder, current or most recent episode manic, with psychotic features (HCC) Diagnosis:   Patient Active Problem List   Diagnosis Date Noted  . Developmental disability [F89] 02/20/2018  . Tobacco use disorder [F17.200] 02/12/2018  . Cannabis use disorder, moderate, dependence (HCC) [F12.20] 02/12/2018  . UTI (urinary tract infection) [N39.0] 02/12/2018  . Bipolar I disorder, current or most recent episode manic, with psychotic features (HCC) [F31.2] 02/11/2018   Total Time spent with patient: 30 minutes  Past Psychiatric History: Unknown past history.  Appears to be having a first manic-like episode  Past Medical History:  Past Medical History:  Diagnosis Date  . Bipolar disorder (HCC)    History reviewed. No pertinent surgical history. Family History: History reviewed. No pertinent family history. Family Psychiatric  History: See previous note Social History:  Social History   Substance and Sexual Activity  Alcohol Use Yes  . Alcohol/week: 1.2 oz  . Types: 2 Glasses of wine per week  . Frequency: Never     Social History   Substance and Sexual Activity  Drug Use Yes  . Types: Methamphetamines    Social History   Socioeconomic History  . Marital status: Single    Spouse name: Not on file  .  Number of children: Not on file  . Years of education: Not on file  . Highest education level: Not on file  Occupational History  . Not on file  Social Needs  . Financial resource strain: Not on file  . Food insecurity:    Worry: Not on file    Inability: Not on file  . Transportation needs:    Medical: Not on file    Non-medical: Not on file  Tobacco Use  . Smoking status: Light Tobacco Smoker    Packs/day: 0.25  . Smokeless tobacco: Never Used  Substance and Sexual Activity  . Alcohol use: Yes    Alcohol/week: 1.2 oz    Types: 2 Glasses of wine per week    Frequency: Never  . Drug use: Yes    Types: Methamphetamines  . Sexual activity: Yes  Lifestyle  . Physical activity:    Days per week: Not on file    Minutes per session: Not on file  . Stress: Not on file  Relationships  . Social connections:    Talks on phone: Not on file    Gets together: Not on file    Attends religious service: Not on file    Active member of club or organization: Not on file    Attends meetings of clubs or organizations: Not on file    Relationship status: Not on file  Other Topics Concern  . Not on file  Social History Narrative  . Not on file   Additional  Social History:                         Sleep: Fair  Appetite:  Fair  Current Medications: Current Facility-Administered Medications  Medication Dose Route Frequency Provider Last Rate Last Dose  . alum & mag hydroxide-simeth (MAALOX/MYLANTA) 200-200-20 MG/5ML suspension 30 mL  30 mL Oral Q4H PRN Estle Huguley T, MD      . ARIPiprazole ER (ABILIFY MAINTENA) injection 400 mg  400 mg Intramuscular Q28 days Pucilowska, Jolanta B, MD   400 mg at 02/21/18 1502  . cephALEXin (KEFLEX) capsule 500 mg  500 mg Oral Q8H Pucilowska, Jolanta B, MD   500 mg at 02/22/18 0619  . divalproex (DEPAKOTE) DR tablet 500 mg  500 mg Oral Q12H Pucilowska, Jolanta B, MD   500 mg at 02/22/18 0808  . feeding supplement (ENSURE ENLIVE) (ENSURE  ENLIVE) liquid 237 mL  237 mL Oral TID BM Pucilowska, Jolanta B, MD   237 mL at 02/22/18 1046  . magnesium hydroxide (MILK OF MAGNESIA) suspension 30 mL  30 mL Oral Daily PRN Briyan Kleven T, MD      . multivitamin with minerals tablet 1 tablet  1 tablet Oral Daily Pucilowska, Jolanta B, MD   1 tablet at 02/22/18 0808  . QUEtiapine (SEROQUEL) tablet 600 mg  600 mg Oral QHS Pucilowska, Jolanta B, MD   600 mg at 02/21/18 2148  . vitamin B-12 (CYANOCOBALAMIN) tablet 1,000 mcg  1,000 mcg Oral Daily Pucilowska, Jolanta B, MD   1,000 mcg at 02/22/18 0808    Lab Results:  Results for orders placed or performed during the hospital encounter of 02/12/18 (from the past 48 hour(s))  Valproic acid level     Status: None   Collection Time: 02/22/18  9:52 AM  Result Value Ref Range   Valproic Acid Lvl 74 50.0 - 100.0 ug/mL    Comment: Performed at [facility], [address] [place], [place] [address]    Blood Alcohol level:  Lab Results  Component Value Date   [facility] <10 02/11/2018    Metabolic Disorder Labs: Lab Results  Component Value Date   HGBA1C 5.4 02/12/2018   MPG 108 02/12/2018   No results found for: PROLACTIN Lab Results  Component Value Date   CHOL 170 02/12/2018   TRIG 63 02/12/2018   HDL 46 02/12/2018   CHOLHDL 3.7 02/12/2018   VLDL 13 02/12/2018   LDLCALC 111 (H) 02/12/2018    Physical Findings: AIMS: Facial and Oral Movements Muscles of Facial Expression: None, normal Lips and Perioral Area: None, normal Jaw: None, normal Tongue: None, normal,Extremity Movements Upper (arms, wrists, hands, fingers): None, normal Lower (legs, knees, ankles, toes): None, normal, Trunk Movements Neck, shoulders, hips: None, normal, Overall Severity Severity of abnormal movements (highest score from questions above): None, normal Incapacitation due to abnormal movements: None, normal Patient's awareness of abnormal movements (rate only patient's report): No Awareness, Dental  Status Current problems with teeth and/or dentures?: No Does patient usually wear dentures?: No  CIWA:  CIWA-Ar Total: 3 COWS:  COWS Total Score: 1  Musculoskeletal: Strength & Muscle Tone: within normal limits Gait & Station: normal Patient leans: N/A  Psychiatric Specialty Exam: Physical Exam  Nursing note and vitals reviewed. Constitutional: She appears well-developed and well-nourished.  HENT:  Head: Normocephalic and atraumatic.  Eyes: Pupils are equal, round, and reactive to light. Conjunctivae are normal.  Neck: Normal range of motion.  Cardiovascular: Regular rhythm  and normal heart sounds.  Respiratory: Effort normal. No respiratory distress.  GI: Soft.  Musculoskeletal: Normal range of motion.  Neurological: She is alert.  Skin: Skin is warm and dry.  Psychiatric: Her affect is inappropriate. Her speech is rapid and/or pressured and tangential. She is agitated. She is not aggressive. Thought content is paranoid and delusional. Cognition and memory are impaired. She expresses inappropriate judgment. She expresses no homicidal and no suicidal ideation.    Review of Systems  Constitutional: Negative.   HENT: Negative.   Eyes: Negative.   Respiratory: Negative.   Cardiovascular: Negative.   Gastrointestinal: Negative.   Musculoskeletal: Negative.   Skin: Negative.   Neurological: Negative.   Psychiatric/Behavioral: Negative.     Blood pressure 112/85, pulse (!) 108, temperature 98.7 F (37.1 C), temperature source Oral, resp. rate 18, height 5\' 5"  (1.651 m), weight 55.3 kg (122 lb), last menstrual period 02/10/2018, SpO2 100 %.Body mass index is 20.3 kg/m.  General Appearance: Disheveled  Eye Contact:  Fair  Speech:  Pressured  Volume:  Increased  Mood:  Euthymic  Affect:  Congruent  Thought Process:  Disorganized  Orientation:  Full (Time, Place, and Person)  Thought Content:  Illogical  Suicidal Thoughts:  No  Homicidal Thoughts:  No  Memory:  Immediate;    Fair Recent;   Fair Remote;   Poor  Judgement:  Impaired  Insight:  Shallow  Psychomotor Activity:  Decreased  Concentration:  Concentration: Fair  Recall:  [employer]Fair  Fund of Knowledge:  Fair  Language:  Fair  Akathisia:  No  Handed:  Right  AIMS (if indicated):     Assets:  Desire for Improvement Physical Health Resilience  ADL's:  Intact  Cognition:  Impaired,  Mild  Sleep:  Number of Hours: 7     Treatment Plan Summary: Daily contact with patient to assess and evaluate symptoms and progress in treatment, Medication management and Plan Patient encouraged to be compliant with medicine as she has been recently although her insight is still very poor.  Mordecai RasmussenJohn Marieclaire Bettenhausen, MD 02/22/2018, 2:15 PM

## 2018-02-22 NOTE — Progress Notes (Signed)
Patient stayed in her room, restless and tired. Upon assessment, patient not willing to participate actively. Gave short answers and was forcing to end the conversation. Preoccupied and struggling with internal stimuli. Received medications in room as she did not want to come to the medication room. Was encouraged to call staff as needed. Currently sleeping and staff continue to monitor.

## 2018-02-22 NOTE — Plan of Care (Signed)
Patient present in the milieu. Continues to express her desire to go home. Walks around the unit with a brown paper bag containing her personal items, states, "Give me my paperwork to sign, I've been here long enough. I need to call my ride." Explained to patient the discharge process and that it is the doctors decision to discharge. Patient accepted the explanation. Compliant with medications and meals. Milieu remains safe with q 15 minute safety checks.

## 2018-02-22 NOTE — Plan of Care (Signed)
Isolative in room, lethargic , restless.

## 2018-02-22 NOTE — [facility] Group Notes (Signed)
LCSW Group Therapy Note  02/22/2018 1:15pm  Type of Therapy and Topic: Group Therapy: Holding on to Grudges   Participation Level: Active   Description of Group:  In this group patients will be asked to explore and define a grudge. Patients will be guided to discuss their thoughts, feelings, and reasons as to why people have grudges. Patients will process the impact grudges have on daily life and identify thoughts and feelings related to holding grudges. Facilitator will challenge patients to identify ways to let go of grudges and the benefits this provides. Patients will be confronted to address why one struggles letting go of grudges. Lastly, patients will identify feelings and thoughts related to what life would look like without grudges. This group will be process-oriented, with patients participating in exploration of their own experiences, giving and receiving support, and processing challenge from other group members.  Therapeutic Goals:  1. Patient will identify specific grudges related to their personal life.  2. Patient will identify feelings, thoughts, and beliefs around grudges.  3. Patient will identify how one releases grudges appropriately.  4. Patient will identify situations where they could have let go of the grudge, but instead chose to hold on.   Summary of Patient Progress: The patient scored her mood at a 4 (10 best). Patients were guided to discuss their thoughts, feelings, and reasons as to why people have grudges. The patient was able to process the impact grudges have on daily life and identified thoughts and feelings related to holding grudges. The patient was challenged to identify ways to let go of grudges and the benefits this provides. Pt. actively and appropriately engaged in the group. Patient was able to provide support and validation to other group members.    Therapeutic Modalities:  Cognitive Behavioral Therapy  Solution Focused Therapy  Motivational  Interviewing  Brief Therapy   Chayah Mckee  CUEBAS-COLON, LCSW 02/22/2018 10:16 AM

## 2018-02-23 NOTE — [facility] Group Notes (Signed)
[facility] Group Notes:  (Nursing/MHT/Case Management/Adjunct)  Date:  02/23/2018  Time:  10:10 PM  Type of Therapy:  Group Therapy  Participation Level:  Active  Participation Quality:  Appropriate  Affect:  Appropriate  Cognitive:  Appropriate  Insight:  Appropriate  Engagement in Group:  Supportive  Modes of Intervention:  Support  Summary of Progress/Problems:  Jenna Bell 02/23/2018, 10:10 PM

## 2018-02-23 NOTE — Progress Notes (Addendum)
Patient was in and out of her room until bedtime. Unable to program in evening activities: pacing in the hallway, focused on being discharged. Requested medications to be taken to her room. Was in bed talking to self. Received medications and had no other concerns. Currently in bed sleeping. Safety maintained.  06:00: Patient has remained asleep after taking Ativan. No sign of distress. Safety precautions maintained.

## 2018-02-23 NOTE — Plan of Care (Signed)
Patient present in the milieu. Continues to express her desire to go home. Patient very irritable today. After prompting for an extended period, patient took her medication this morning. States, "I'm getting out of here, you are keeping me here against my will. [facility]."  Patient reoriented to place and explained to her that [facility] was [facility]. After de-escalating the patient's mood, patient took her medication. Present in the milieu this afternoon with minimal interaction. Milieu remains safe with q 15 minute safety checks. Will continue to monitor.

## 2018-02-23 NOTE — Progress Notes (Signed)
[facility] MD Progress Note  02/23/2018 12:44 PM Jenna Bell  MRN:  [id number] Subjective: Follow-up patient with bipolar disorder.  She was less agitated today than she was yesterday.  Had no complaints.  Appears to have slept adequately.  Valproic level yesterday was 74 which is still within the normal range lower than it was previously.  No sign of delirium.  She does come out of her room from time to time but often is still disorganized and has a hard time interacting appropriately.  Poor insight. Principal Problem: Bipolar I disorder, current or most recent episode manic, with psychotic features (HCC) Diagnosis:   Patient Active Problem List   Diagnosis Date Noted  . Developmental disability [F89] 02/20/2018  . Tobacco use disorder [F17.200] 02/12/2018  . Cannabis use disorder, moderate, dependence (HCC) [F12.20] 02/12/2018  . UTI (urinary tract infection) [N39.0] 02/12/2018  . Bipolar I disorder, current or most recent episode manic, with psychotic features (HCC) [F31.2] 02/11/2018   Total Time spent with patient: 30 minutes  Past Psychiatric History: History of bipolar disorder and psychosis with poor insight  Past Medical History:  Past Medical History:  Diagnosis Date  . Bipolar disorder (HCC)    History reviewed. No pertinent surgical history. Family History: History reviewed. No pertinent family history. Family Psychiatric  History: See previous note Social History:  Social History   Substance and Sexual Activity  Alcohol Use Yes  . Alcohol/week: 1.2 oz  . Types: 2 Glasses of wine per week  . Frequency: Never     Social History   Substance and Sexual Activity  Drug Use Yes  . Types: Methamphetamines    Social History   Socioeconomic History  . Marital status: Single    Spouse name: Not on file  . Number of children: Not on file  . Years of education: Not on file  . Highest education level: Not on file  Occupational History  . Not on file  Social Needs   . Financial resource strain: Not on file  . Food insecurity:    Worry: Not on file    Inability: Not on file  . Transportation needs:    Medical: Not on file    Non-medical: Not on file  Tobacco Use  . Smoking status: Light Tobacco Smoker    Packs/day: 0.25  . Smokeless tobacco: Never Used  Substance and Sexual Activity  . Alcohol use: Yes    Alcohol/week: 1.2 oz    Types: 2 Glasses of wine per week    Frequency: Never  . Drug use: Yes    Types: Methamphetamines  . Sexual activity: Yes  Lifestyle  . Physical activity:    Days per week: Not on file    Minutes per session: Not on file  . Stress: Not on file  Relationships  . Social connections:    Talks on phone: Not on file    Gets together: Not on file    Attends religious service: Not on file    Active member of club or organization: Not on file    Attends meetings of clubs or organizations: Not on file    Relationship status: Not on file  Other Topics Concern  . Not on file  Social History Narrative  . Not on file   Additional Social History:                         Sleep: Fair  Appetite:  Fair  Current Medications:  Current Facility-Administered Medications  Medication Dose Route Frequency Provider Last Rate Last Dose  . alum & mag hydroxide-simeth (MAALOX/MYLANTA) 200-200-20 MG/5ML suspension 30 mL  30 mL Oral Q4H PRN Shakendra Griffeth T, MD      . ARIPiprazole ER (ABILIFY MAINTENA) injection 400 mg  400 mg Intramuscular Q28 days Pucilowska, Jolanta B, MD   400 mg at 02/21/18 1502  . divalproex (DEPAKOTE) DR tablet 500 mg  500 mg Oral Q12H Pucilowska, Jolanta B, MD   500 mg at 02/23/18 0852  . feeding supplement (ENSURE ENLIVE) (ENSURE ENLIVE) liquid 237 mL  237 mL Oral TID BM Pucilowska, Jolanta B, MD   237 mL at 02/23/18 1015  . magnesium hydroxide (MILK OF MAGNESIA) suspension 30 mL  30 mL Oral Daily PRN Layci Stenglein T, MD      . multivitamin with minerals tablet 1 tablet  1 tablet Oral Daily  Pucilowska, Jolanta B, MD   1 tablet at 02/23/18 0852  . QUEtiapine (SEROQUEL) tablet 600 mg  600 mg Oral QHS Pucilowska, Jolanta B, MD   600 mg at 02/22/18 2146  . vitamin B-12 (CYANOCOBALAMIN) tablet 1,000 mcg  1,000 mcg Oral Daily Pucilowska, Jolanta B, MD   1,000 mcg at 02/23/18 1610    Lab Results:  Results for orders placed or performed during the hospital encounter of 02/12/18 (from the past 48 hour(s))  Valproic acid level     Status: None   Collection Time: 02/22/18  9:52 AM  Result Value Ref Range   Valproic Acid Lvl 74 50.0 - 100.0 ug/mL    Comment: Performed at [facility], [address] [place], [place] [address]    Blood Alcohol level:  Lab Results  Component Value Date   [facility] <10 02/11/2018    Metabolic Disorder Labs: Lab Results  Component Value Date   HGBA1C 5.4 02/12/2018   MPG 108 02/12/2018   No results found for: PROLACTIN Lab Results  Component Value Date   CHOL 170 02/12/2018   TRIG 63 02/12/2018   HDL 46 02/12/2018   CHOLHDL 3.7 02/12/2018   VLDL 13 02/12/2018   LDLCALC 111 (H) 02/12/2018    Physical Findings: AIMS: Facial and Oral Movements Muscles of Facial Expression: None, normal Lips and Perioral Area: None, normal Jaw: None, normal Tongue: None, normal,Extremity Movements Upper (arms, wrists, hands, fingers): None, normal Lower (legs, knees, ankles, toes): None, normal, Trunk Movements Neck, shoulders, hips: None, normal, Overall Severity Severity of abnormal movements (highest score from questions above): None, normal Incapacitation due to abnormal movements: None, normal Patient's awareness of abnormal movements (rate only patient's report): No Awareness, Dental Status Current problems with teeth and/or dentures?: No Does patient usually wear dentures?: No  CIWA:  CIWA-Ar Total: 3 COWS:  COWS Total Score: 1  Musculoskeletal: Strength & Muscle Tone: within normal limits Gait & Station: normal Patient leans:  N/A  Psychiatric Specialty Exam: Physical Exam  Nursing note and vitals reviewed. Constitutional: She appears well-developed and well-nourished.  HENT:  Head: Normocephalic and atraumatic.  Eyes: Pupils are equal, round, and reactive to light. Conjunctivae are normal.  Neck: Normal range of motion.  Cardiovascular: Regular rhythm and normal heart sounds.  Respiratory: Effort normal. No respiratory distress.  GI: Soft.  Musculoskeletal: Normal range of motion.  Neurological: She is alert.  Skin: Skin is warm and dry.  Psychiatric: Her affect is blunt. Her speech is delayed. She is slowed. Thought content is paranoid. Cognition and memory are impaired. She expresses impulsivity. She  expresses no homicidal and no suicidal ideation.    Review of Systems  Constitutional: Negative.   HENT: Negative.   Eyes: Negative.   Respiratory: Negative.   Cardiovascular: Negative.   Gastrointestinal: Negative.   Musculoskeletal: Negative.   Skin: Negative.   Neurological: Negative.   Psychiatric/Behavioral: Negative.     Blood pressure 122/83, pulse (!) 111, temperature 97.9 F (36.6 C), temperature source Oral, resp. rate 18, height 5\' 5"  (1.651 m), weight 55.3 kg (122 lb), last menstrual period 02/10/2018, SpO2 100 %.Body mass index is 20.3 kg/m.  General Appearance: Casual  Eye Contact:  Fair  Speech:  Slow  Volume:  Decreased  Mood:  Dysphoric  Affect:  Constricted  Thought Process:  Disorganized  Orientation:  Full (Time, Place, and Person)  Thought Content:  Illogical  Suicidal Thoughts:  No  Homicidal Thoughts:  No  Memory:  Immediate;   Fair Recent;   Fair Remote;   Fair  Judgement:  Fair  Insight:  Fair  Psychomotor Activity:  Decreased  Concentration:  Concentration: Poor  Recall:  Poor  Fund of Knowledge:  Fair  Language:  Fair  Akathisia:  No  Handed:  Right  AIMS (if indicated):     Assets:  Physical Health Resilience  ADL's:  Intact  Cognition:  Impaired,  Mild   Sleep:  Number of Hours: 7     Treatment Plan Summary: Daily contact with patient to assess and evaluate symptoms and progress in treatment, Medication management and Plan Continue current medication management.  Encourage patient's compliance.  Case reviewed with nursing.  No specific change to any treatment plan at this point.  Mordecai RasmussenJohn Jaccob Czaplicki, MD 02/23/2018, 12:44 PM

## 2018-02-23 NOTE — Plan of Care (Signed)
D: Pt denies SI/HI/AVH. Pt is pleasant and cooperative. Pt a little irritable this evening, but cooperative. Pt stated she felt "better" today.  A: Pt was offered support and encouragement. Pt was given scheduled medications. Pt was encourage to attend groups. Q 15 minute checks were done for safety.   R: safety maintained on unit.   Problem: Education: Goal: Emotional status will improve Outcome: Progressing   Problem: Education: Goal: Mental status will improve Outcome: Progressing   Problem: Education: Goal: Will be free of psychotic symptoms Outcome: Progressing   Problem: Coping: Goal: Coping ability will improve Outcome: Progressing   Problem: Role Relationship: Goal: Ability to interact with others will improve Outcome: Progressing

## 2018-02-23 NOTE — Plan of Care (Signed)
Was observed talking to self but remained cooperative  and able to express needs

## 2018-02-23 NOTE — [facility] Group Notes (Signed)
LCSW Group Therapy Note 02/23/2018 1:15pm  Type of Therapy and Topic: Group Therapy: Feelings Around Returning Home & Establishing a Supportive Framework and Supporting Oneself When Supports Not Available  Participation Level: Active  Description of Group:  Patients first processed thoughts and feelings about upcoming discharge. These included fears of upcoming changes, lack of change, new living environments, judgements and expectations from others and overall stigma of mental health issues. The group then discussed the definition of a supportive framework, what that looks and feels like, and how do to discern it from an unhealthy non-supportive network. The group identified different types of supports as well as what to do when your family/friends are less than helpful or unavailable  Therapeutic Goals  1. Patient will identify one healthy supportive network that they can use at discharge. 2. Patient will identify one factor of a supportive framework and how to tell it from an unhealthy network. 3. Patient able to identify one coping skill to use when they do not have positive supports from others. 4. Patient will demonstrate ability to communicate their needs through discussion and/or role plays.  Summary of Patient Progress:  Patient reported she feels "okay." Pt engaged during group session. As patients processed their anxiety about discharge and described healthy supports patient shared thatshe is ready to be discharge. She stated "my mind is clear and I know what I need to do."  Patients identified at least one self-care tool they were willing to use after discharge.   Therapeutic Modalities Cognitive Behavioral Therapy Motivational Interviewing   Mykenzie Ebanks  CUEBAS-COLON, LCSW 02/23/2018 10:06 AM

## 2018-02-24 NOTE — Plan of Care (Signed)
Patient stayed in room most of the time.Talking to herself in her room.Patient denies that she is talking to anyone.Reluctant to talk to staff.Denies SI,HI and AVH.Compliant with medications.Attended some groups.Appetite and energy level good.Support and encouragement given

## 2018-02-24 NOTE — Progress Notes (Signed)
Recreation Therapy Notes  Date: 02/24/2018  Time: 9:30 am   Location: Craft Room   Behavioral response: N/A   Intervention Topic: Creative Expressions  Discussion/Intervention: Patient did not attend group.   Clinical Observations/Feedback:  Patient did not attend group.   Tobi Leinweber LRT/CTRS        Ronni Osterberg 02/24/2018 10:22 AM 

## 2018-02-24 NOTE — Progress Notes (Addendum)
[facility] MD Progress Note  02/25/2018 11:18 AM Fawn Desrocher  MRN:  [id number]  Subjective:   Ms. Spruiell is actively hallucinating. She is completely immersed in her psychosis. Does not care to interact with the outside world. For the second day in a row, she askes me te to leave the room as soon as I come in. She used to come to my office for a chat several times a day last week.   Her family, again, refused any support. The patient has absolutely no resources.   Principal Problem: Bipolar I disorder, current or most recent episode manic, with psychotic features (HCC) Diagnosis:   Patient Active Problem List   Diagnosis Date Noted  . Bipolar I disorder, current or most recent episode manic, with psychotic features (HCC) [F31.2] 02/11/2018    Priority: High  . Developmental disability [F89] 02/20/2018  . Tobacco use disorder [F17.200] 02/12/2018  . Cannabis use disorder, moderate, dependence (HCC) [F12.20] 02/12/2018  . UTI (urinary tract infection) [N39.0] 02/12/2018   Total Time spent with patient: 20 minutes  Past Psychiatric History: intellectual disability  Past Medical History:  Past Medical History:  Diagnosis Date  . Bipolar disorder (HCC)    History reviewed. No pertinent surgical history. Family History: History reviewed. No pertinent family history. Family Psychiatric  History: brother with mental illness Social History:  Social History   Substance and Sexual Activity  Alcohol Use Yes  . Alcohol/week: 1.2 oz  . Types: 2 Glasses of wine per week  . Frequency: Never     Social History   Substance and Sexual Activity  Drug Use Yes  . Types: Methamphetamines    Social History   Socioeconomic History  . Marital status: Single    Spouse name: Not on file  . Number of children: Not on file  . Years of education: Not on file  . Highest education level: Not on file  Occupational History  . Not on file  Social Needs  . Financial resource strain: Not on  file  . Food insecurity:    Worry: Not on file    Inability: Not on file  . Transportation needs:    Medical: Not on file    Non-medical: Not on file  Tobacco Use  . Smoking status: Light Tobacco Smoker    Packs/day: 0.25  . Smokeless tobacco: Never Used  Substance and Sexual Activity  . Alcohol use: Yes    Alcohol/week: 1.2 oz    Types: 2 Glasses of wine per week    Frequency: Never  . Drug use: Yes    Types: Methamphetamines  . Sexual activity: Yes  Lifestyle  . Physical activity:    Days per week: Not on file    Minutes per session: Not on file  . Stress: Not on file  Relationships  . Social connections:    Talks on phone: Not on file    Gets together: Not on file    Attends religious service: Not on file    Active member of club or organization: Not on file    Attends meetings of clubs or organizations: Not on file    Relationship status: Not on file  Other Topics Concern  . Not on file  Social History Narrative  . Not on file   Additional Social History:                         Sleep: Fair  Appetite:  Fair  Current  Medications: Current Facility-Administered Medications  Medication Dose Route Frequency Provider Last Rate Last Dose  . alum & mag hydroxide-simeth (MAALOX/MYLANTA) 200-200-20 MG/5ML suspension 30 mL  30 mL Oral Q4H PRN Clapacs, John T, MD      . ARIPiprazole ER (ABILIFY MAINTENA) injection 400 mg  400 mg Intramuscular Q28 days Phoenicia Pirie B, MD   400 mg at 02/21/18 1502  . divalproex (DEPAKOTE) DR tablet 500 mg  500 mg Oral Q12H Drevon Plog B, MD   500 mg at 02/25/18 0803  . feeding supplement (ENSURE ENLIVE) (ENSURE ENLIVE) liquid 237 mL  237 mL Oral TID BM Roen Macgowan B, MD   237 mL at 02/24/18 2011  . magnesium hydroxide (MILK OF MAGNESIA) suspension 30 mL  30 mL Oral Daily PRN Clapacs, John T, MD      . multivitamin with minerals tablet 1 tablet  1 tablet Oral Daily Gonsalo Cuthbertson B, MD   1 tablet at  02/25/18 0803  . QUEtiapine (SEROQUEL) tablet 600 mg  600 mg Oral QHS Nairi Oswald B, MD   600 mg at 02/24/18 2116  . vitamin B-12 (CYANOCOBALAMIN) tablet 1,000 mcg  1,000 mcg Oral Daily Jamir Rone B, MD   1,000 mcg at 02/25/18 0803    Lab Results: No results found for this or any previous visit (from the past 48 hour(s)).  Blood Alcohol level:  Lab Results  Component Value Date   ETH <10 02/11/2018    Metabolic Disorder Labs: Lab Results  Component Value Date   HGBA1C 5.4 02/12/2018   MPG 108 02/12/2018   No results found for: PROLACTIN Lab Results  Component Value Date   CHOL 170 02/12/2018   TRIG 63 02/12/2018   HDL 46 02/12/2018   CHOLHDL 3.7 02/12/2018   VLDL 13 02/12/2018   LDLCALC 111 (H) 02/12/2018    Physical Findings: AIMS: Facial and Oral Movements Muscles of Facial Expression: None, normal Lips and Perioral Area: None, normal Jaw: None, normal Tongue: None, normal,Extremity Movements Upper (arms, wrists, hands, fingers): None, normal Lower (legs, knees, ankles, toes): None, normal, Trunk Movements Neck, shoulders, hips: None, normal, Overall Severity Severity of abnormal movements (highest score from questions above): None, normal Incapacitation due to abnormal movements: None, normal Patient's awareness of abnormal movements (rate only patient's report): No Awareness, Dental Status Current problems with teeth and/or dentures?: No Does patient usually wear dentures?: No  CIWA:  CIWA-Ar Total: 3 COWS:  COWS Total Score: 1  Musculoskeletal: Strength & Muscle Tone: within normal limits Gait & Station: normal Patient leans: N/A  Psychiatric Specialty Exam: Physical Exam  Nursing note and vitals reviewed. Psychiatric: Her affect is blunt. Her speech is delayed. She is withdrawn and actively hallucinating. Thought content is paranoid and delusional. Cognition and memory are impaired. She expresses impulsivity.    Review of Systems   Neurological: Negative.   Psychiatric/Behavioral: Positive for hallucinations and substance abuse.  All other systems reviewed and are negative.   Blood pressure 119/81, pulse 97, temperature 97.8 F (36.6 C), resp. rate 18, height 5\' 5"  (1.651 m), weight 55.3 kg (122 lb), last menstrual period 02/10/2018, SpO2 100 %.Body mass index is 20.3 kg/m.  General Appearance: Fairly Groomed  Eye Contact:  Fair  Speech:  Pressured  Volume:  Increased  Mood:  Irritable  Affect:  Congruent  Thought Process:  Disorganized and Descriptions of Associations: Loose  Orientation:  Full (Time, Place, and Person)  Thought Content:  Delusions, Hallucinations: Auditory and Paranoid Ideation  Suicidal  Thoughts:  No  Homicidal Thoughts:  No  Memory:  Immediate;   Poor Recent;   Poor Remote;   Poor  Judgement:  Poor  Insight:  Lacking  Psychomotor Activity:  Increased  Concentration:  Concentration: Poor and Attention Span: Poor  Recall:  Poor  Fund of Knowledge:  Poor  Language:  Poor  Akathisia:  No  Handed:  Right  AIMS (if indicated):     Assets:  Communication Skills Desire for Improvement Physical Health Resilience Social Support  ADL's:  Intact  Cognition:  WNL  Sleep:  Number of Hours: 8     Treatment Plan Summary: Daily contact with patient to assess and evaluate symptoms and progress in treatment and Medication management   Ms. Cantave is a 32 year old female witha history of developmental disability but no mental illnessadmitted for psychotic break.Shewas floridly manic on admission. Agitation has resolved but the patient is still paranoid, delusional and hallucinating.  #Agitation, resolved  #Mood and psychosis, not improving -continueSeroquel to600 mg nightlyfor 2 more weeks -continue Abilify maintena400 mg injection, next dose on 8/16 -continueDepakote to 500 mg BID, VPA level 74  #Insomnia -Trazodone 100 mg nightly  #UTI, completed treatment with  Keflex  #Substance abuse -positive for cannabis -unable to discuss substance abuse treatment  #Smoking cessation -nicotine patch is available  #B12 defficiency -Vit B12 1000 ug daily  #Weight loss -MVI -Ensure TID  #Labs -lipid panel, TSH and A1Care normal -EKGreviewed, NSR with QTc 423 -head CT scan is negative -pregnancy test negative  #Disposition -homeless -follow upTBE    Kristine LineaJolanta Oceane Fosse, MD 02/25/2018, 11:18 AM

## 2018-02-24 NOTE — Progress Notes (Signed)
[facility] MD Progress Note  02/24/2018 11:25 AM Kenyada Dosch  MRN:  [id number]  Subjective:    Ms. Luepke is still psychotic, forgetful, unable to process information and fully participate in treatment. She is hyperfocused on discharge even though there is no safe discharge plann. She seems to tolerates medications well. She accepted Abilify maintena injection. It is unlikely she will continue treatment in the community.  Principal Problem: Bipolar I disorder, current or most recent episode manic, with psychotic features (HCC) Diagnosis:   Patient Active Problem List   Diagnosis Date Noted  . Bipolar I disorder, current or most recent episode manic, with psychotic features (HCC) [F31.2] 02/11/2018    Priority: High  . Developmental disability [F89] 02/20/2018  . Tobacco use disorder [F17.200] 02/12/2018  . Cannabis use disorder, moderate, dependence (HCC) [F12.20] 02/12/2018  . UTI (urinary tract infection) [N39.0] 02/12/2018   Total Time spent with patient: 20 minutes  Past Psychiatric History: intellectual disability  Past Medical History:  Past Medical History:  Diagnosis Date  . Bipolar disorder (HCC)    History reviewed. No pertinent surgical history. Family History: History reviewed. No pertinent family history. Family Psychiatric  History: rother with mental illness Social History:  Social History   Substance and Sexual Activity  Alcohol Use Yes  . Alcohol/week: 1.2 oz  . Types: 2 Glasses of wine per week  . Frequency: Never     Social History   Substance and Sexual Activity  Drug Use Yes  . Types: Methamphetamines    Social History   Socioeconomic History  . Marital status: Single    Spouse name: Not on file  . Number of children: Not on file  . Years of education: Not on file  . Highest education level: Not on file  Occupational History  . Not on file  Social Needs  . Financial resource strain: Not on file  . Food insecurity:    Worry: Not on file     Inability: Not on file  . Transportation needs:    Medical: Not on file    Non-medical: Not on file  Tobacco Use  . Smoking status: Light Tobacco Smoker    Packs/day: 0.25  . Smokeless tobacco: Never Used  Substance and Sexual Activity  . Alcohol use: Yes    Alcohol/week: 1.2 oz    Types: 2 Glasses of wine per week    Frequency: Never  . Drug use: Yes    Types: Methamphetamines  . Sexual activity: Yes  Lifestyle  . Physical activity:    Days per week: Not on file    Minutes per session: Not on file  . Stress: Not on file  Relationships  . Social connections:    Talks on phone: Not on file    Gets together: Not on file    Attends religious service: Not on file    Active member of club or organization: Not on file    Attends meetings of clubs or organizations: Not on file    Relationship status: Not on file  Other Topics Concern  . Not on file  Social History Narrative  . Not on file   Additional Social History:                         Sleep: Fair  Appetite:  Fair  Current Medications: Current Facility-Administered Medications  Medication Dose Route Frequency Provider Last Rate Last Dose  . alum & mag hydroxide-simeth (MAALOX/MYLANTA) 200-200-20 MG/5ML  suspension 30 mL  30 mL Oral Q4H PRN Clapacs, John T, MD      . ARIPiprazole ER (ABILIFY MAINTENA) injection 400 mg  400 mg Intramuscular Q28 days America Sandall B, MD   400 mg at 02/21/18 1502  . divalproex (DEPAKOTE) DR tablet 500 mg  500 mg Oral Q12H Anchor Dwan B, MD   500 mg at 02/24/18 0803  . feeding supplement (ENSURE ENLIVE) (ENSURE ENLIVE) liquid 237 mL  237 mL Oral TID BM Bryelle Spiewak B, MD   237 mL at 02/23/18 2103  . magnesium hydroxide (MILK OF MAGNESIA) suspension 30 mL  30 mL Oral Daily PRN Clapacs, John T, MD      . multivitamin with minerals tablet 1 tablet  1 tablet Oral Daily Briellah Baik B, MD   1 tablet at 02/24/18 0802  . QUEtiapine (SEROQUEL) tablet 600 mg   600 mg Oral QHS Inger Wiest B, MD   600 mg at 02/23/18 2123  . vitamin B-12 (CYANOCOBALAMIN) tablet 1,000 mcg  1,000 mcg Oral Daily Umberto Pavek B, MD   1,000 mcg at 02/24/18 0803    Lab Results:  No results found for this or any previous visit (from the past 48 hour(s)).  Blood Alcohol level:  Lab Results  Component Value Date   ETH <10 02/11/2018    Metabolic Disorder Labs: Lab Results  Component Value Date   HGBA1C 5.4 02/12/2018   MPG 108 02/12/2018   No results found for: PROLACTIN Lab Results  Component Value Date   CHOL 170 02/12/2018   TRIG 63 02/12/2018   HDL 46 02/12/2018   CHOLHDL 3.7 02/12/2018   VLDL 13 02/12/2018   LDLCALC 111 (H) 02/12/2018    Physical Findings: AIMS: Facial and Oral Movements Muscles of Facial Expression: None, normal Lips and Perioral Area: None, normal Jaw: None, normal Tongue: None, normal,Extremity Movements Upper (arms, wrists, hands, fingers): None, normal Lower (legs, knees, ankles, toes): None, normal, Trunk Movements Neck, shoulders, hips: None, normal, Overall Severity Severity of abnormal movements (highest score from questions above): None, normal Incapacitation due to abnormal movements: None, normal Patient's awareness of abnormal movements (rate only patient's report): No Awareness, Dental Status Current problems with teeth and/or dentures?: No Does patient usually wear dentures?: No  CIWA:  CIWA-Ar Total: 3 COWS:  COWS Total Score: 1  Musculoskeletal: Strength & Muscle Tone: within normal limits Gait & Station: normal Patient leans: N/A  Psychiatric Specialty Exam: Physical Exam  Nursing note and vitals reviewed. Psychiatric: Her affect is labile and inappropriate. Her speech is rapid and/or pressured. She is hyperactive. Thought content is paranoid and delusional. Cognition and memory are impaired. She expresses impulsivity.    Review of Systems  Neurological: Negative.    Psychiatric/Behavioral: Positive for substance abuse.  All other systems reviewed and are negative.   Blood pressure 109/74, pulse 100, temperature 98 F (36.7 C), resp. rate 18, height 5\' 5"  (1.651 m), weight 55.3 kg (122 lb), last menstrual period 02/10/2018, SpO2 100 %.Body mass index is 20.3 kg/m.  General Appearance: Casual  Eye Contact:  Good  Speech:  Clear and Coherent  Volume:  Normal  Mood:  Irritable  Affect:  Congruent  Thought Process:  Goal Directed and Descriptions of Associations: Intact  Orientation:  Full (Time, Place, and Person)  Thought Content:  Illogical and Tangential  Suicidal Thoughts:  No  Homicidal Thoughts:  No  Memory:  Immediate;   Poor Recent;   Poor Remote;   Poor  Judgement:  Poor  Insight:  Lacking  Psychomotor Activity:  Increased  Concentration:  Concentration: Poor and Attention Span: Poor  Recall:  Poor  Fund of Knowledge:  Poor  Language:  Poor  Akathisia:  No  Handed:  Right  AIMS (if indicated):     Assets:  Communication Skills Desire for Improvement Physical Health Resilience  ADL's:  Intact  Cognition:  WNL  Sleep:  Number of Hours: 7.45     Treatment Plan Summary: Daily contact with patient to assess and evaluate symptoms and progress in treatment and Medication management   Ms. Bang is a 32 year old female witha history of developmental disability but no mental illnessadmitted for psychotic break.Shewas floridly manic on admission. Agitation has resolved but the patient is still paranoid, delusional and hallucinating.  #Agitation, resolved  #Mood and psychosis, improving -continueSeroquel to600 mg nightly for 2 more weeks -continue Abilify maintena 400 mg injection, next dose on 8/16 -continueDepakote to 500 mg BID, VPA level 74  #Insomnia -Trazodone 100 mg nightly  #UTI, completed treatment with Keflex  #Substance abuse -positive for cannabis -unable to discuss substance abuse  treatment  #Smoking cessation -nicotine patch is available  #B12 defficiency -Vit B12 1000 ug daily  #Weight loss -MVI -Ensure TID  #Labs -lipid panel, TSH and A1Care normal -EKGreviewed, NSR with QTc 423 -head CT scan is negative -pregnancy test negative  #Disposition -homeless -follow up TBE    Kristine LineaJolanta Sedonia Kitner, MD 02/24/2018, 11:25 AM

## 2018-02-24 NOTE — [facility] Counselor (Signed)
CSW called the patients mother Kristie CowmanLisa Kitner ([phone]) to coordinate a discharge plan and also get a number for the patients Uncle Bland SpanElmore Hawkins. The mother reports that she is on [address] and there isn't suppose to have anyone living in there with her. The mother reports that the patient uncle will not give her any money. The mother reports that there is no family members that are willing to take her. The mother reports that she has many health problems and was just released from the hospital herself.  CSW told the patients mother that the patient will be discharged to a shelter if no one is willing to take her.   Johny Shearsassandra Melainie Krinsky, MSW, Theresia MajorsLCSWA, Bridget HartshornLCASA Clinical Social Worker 02/24/2018 3:07 PM

## 2018-02-25 NOTE — Progress Notes (Signed)
Patient was in and out of the room. Disheveled, has poor hygiene and body odor. Guarded and brief. Denying SI/HI. Denying hallucinations but was observed talking to self. Patient avoiding long talks. Had medications, had a snack and went to bed,. Currently in bed sleeping. Safety precautions maintained.

## 2018-02-25 NOTE — [facility] Counselor (Signed)
[facility] and have no space available at this time.  Johny Shearsassandra Prabhjot Piscitello, MSW, Theresia MajorsLCSWA, Bridget HartshornLCASA Clinical Social Worker 02/25/2018 10:34 AM

## 2018-02-25 NOTE — [facility] Group Notes (Signed)
02/25/2018 1PM  Type of Therapy/Topic:  Group Therapy:  Feelings about Diagnosis  Participation Level:  Did Not Attend   Description of Group:   This group will allow patients to explore their thoughts and feelings about diagnoses they have received. Patients will be guided to explore their level of understanding and acceptance of these diagnoses. Facilitator will encourage patients to process their thoughts and feelings about the reactions of others to their diagnosis and will guide patients in identifying ways to discuss their diagnosis with significant others in their lives. This group will be process-oriented, with patients participating in exploration of their own experiences, giving and receiving support, and processing challenge from other group members.   Therapeutic Goals: 1. Patient will demonstrate understanding of diagnosis as evidenced by identifying two or more symptoms of the disorder 2. Patient will be able to express two feelings regarding the diagnosis 3. Patient will demonstrate their ability to communicate their needs through discussion and/or role play  Summary of Patient Progress: Patient was encouraged and invited to attend group. Patient did not attend group. Social worker will continue to encourage group participation in the future.        Therapeutic Modalities:   Cognitive Behavioral Therapy Brief Therapy Feelings Identification    Johny ShearsCassandra  Leni Pankonin, Alexander MtLCSW 02/25/2018 2:38 PM

## 2018-02-25 NOTE — Plan of Care (Signed)
Patient rated her depression 8/10 and anxiety 6/10.Patient stayed in room and talking to herself.Denies SI,HI and AVH.States "I am good."Attended some groups.Compliant with medications.Appetite and energy level good.Support and encouragement given.

## 2018-02-25 NOTE — Plan of Care (Signed)
Alert and oriented. Taking medications. Cooperative

## 2018-02-25 NOTE — [facility] Group Notes (Signed)
[facility] Group Notes:  (Nursing/MHT/Case Management/Adjunct)  Date:  02/25/2018  Time:  10:46 PM  Type of Therapy:  Group Therapy  Participation Level:  Active  Participation Quality:  Appropriate  Affect:  Appropriate  Cognitive:  Hallucinating  Insight:  Appropriate  Engagement in Group:  Engaged  Modes of Intervention:  Discussion  Summary of Progress/Problems: Harlie participated in group. Anginette shared with group and reported her goal was to get out of the hospital. Glee ArvinLatoya stated she hoped to leave and had talked with her doctor. Glee ArvinLatoya continues to respond to internal stimuli. Glee ArvinLatoya was talking to herself before group began. Aissata was attentive during group. MHT reviewed rules and expectations of unit. MHT encouraged patients to clean up after themselves. MHT informed patients not to take food back to the rooms. MHT informed patients of visitation and phone hours. MHT informed patients of vitals at 6am and encouraged everyone to get a good night sleep. MHT informed patients that techs would be looking in rooms routinely to perform checks and for everyone to keep covered up throughout the night. MHT covered topic of preventing and managing stress. MHT discussed identifying stressors in patient's life. MHT discussed the importance of relaxing and taking time for self. MHT discussed self-awareness and preparing for the things that cause stress. MHT provided examples of how to deal with the stressors of hearing voices. MHT discussed taking medications as prescribed. Group discussion on strategies to help with hearing voices. Things to try brought up in group were using head phones, using ear buds, cotton swabs, exercising, and talking to doctor if medications were not effective. MHT discussed the signs of stress and informed of burn out.  Jinger NeighborsKeith D Caniyah Murley 02/25/2018, 10:46 PM

## 2018-02-25 NOTE — [facility] Group Notes (Signed)
CSW Group Therapy Note  02/25/2018  Time:  0900  Type of Therapy and Topic: Group Therapy: Goals Group: SMART Goals    Participation Level:  Active    Description of Group:   The purpose of a daily goals group is to assist and guide patients in setting recovery/wellness-related goals. The objective is to set goals as they relate to the crisis in which they were admitted. Patients will be using SMART goal modalities to set measurable goals. Characteristics of realistic goals will be discussed and patients will be assisted in setting and processing how one will reach their goal. Facilitator will also assist patients in applying interventions and coping skills learned in psycho-education groups to the SMART goal and process how one will achieve defined goal.    Therapeutic Goals:  -Patients will develop and document one goal related to or their crisis in which brought them into treatment.  -Patients will be guided by LCSW using SMART goal setting modality in how to set a measurable, attainable, realistic and time sensitive goal.  -Patients will process barriers in reaching goal.  -Patients will process interventions in how to overcome and successful in reaching goal.    Patient's Goal:  Pt attended group and was able to appropriately participate in group discussion. Pt was observed responding to internal stimuli, but was able to answer questions when prompted by CSW. Pt reported her goal for the day is, "to work on my notes for at least thirty minutes by the end of the day."    Therapeutic Modalities:  Motivational Interviewing  Engineer, manufacturing systemsCognitive Behavioral Therapy  Crisis Intervention Model  SMART goals setting  Heidi DachKelsey Keaten Mashek, MSW, LCSW Clinical Social Worker 02/25/2018 9:45 AM

## 2018-02-25 NOTE — [facility] Group Notes (Signed)
[facility] Group Notes:  (Nursing/MHT/Case Management/Adjunct)  Date:  02/25/2018  Time:  2:48 PM  Type of Therapy:  Psychoeducational Skills  Participation Level:  Active  Participation Quality:  Appropriate, Attentive and Sharing  Affect:  Appropriate  Cognitive:  Alert and Appropriate  Insight:  Appropriate  Engagement in Group:  Engaged  Modes of Intervention:  Discussion, Education and Support  Summary of Progress/Problems:  Jenna Bell 02/25/2018, 2:48 PM

## 2018-02-25 NOTE — Progress Notes (Signed)
Recreation Therapy Notes   Date: 02/25/2018  Time: 9:30 am  Location: Craft Room  Behavioral response: Appropriate    Intervention Topic: Values  Discussion/Intervention:  Group content today was focused on values. The group identified what values are and where they come from. Individuals expressed some values and how many they have. Patients described how they go about adding or removing values. The group described the importance of having values and how they go about using them in daily life. Patient participated in the intervention "My Values" where they were able to pick out values that were important to them and make a visual aide.  Clinical Observations/Feedback:  Patient came to group late due to unknown reasons. Individual responded to internal stimuli while in group but also managed to participated in the intervention during group.  Mahaley Schwering LRT/CTRS          Anvi Mangal 02/25/2018 11:00 AM

## 2018-02-25 NOTE — Progress Notes (Signed)
[facility] MD Progress Note  02/26/2018 1:59 PM Jenna Bell  MRN:  [id number]  Subjective:    [name] seemed slightly better last night as she was out of her room, in the milieu, and participated in two groups. She went to her morning group as well and was able to contribute. She still is not ready to talk to me. She denies any symptoms of depression, anxiety or psychosis. She is not suicidal or homicidal. She ushers me out of her room. She is still hallucinating loudly.   Principal Problem: Bipolar I disorder, current or most recent episode manic, with psychotic features (HCC) Diagnosis:   Patient Active Problem List   Diagnosis Date Noted  . Bipolar I disorder, current or most recent episode manic, with psychotic features (HCC) [F31.2] 02/11/2018    Priority: High  . Developmental disability [F89] 02/20/2018  . Tobacco use disorder [F17.200] 02/12/2018  . Cannabis use disorder, moderate, dependence (HCC) [F12.20] 02/12/2018  . UTI (urinary tract infection) [N39.0] 02/12/2018   Total Time spent with patient: 20 minutes  Past Psychiatric History: intellectual disability  Past Medical History:  Past Medical History:  Diagnosis Date  . Bipolar disorder (HCC)    History reviewed. No pertinent surgical history. Family History: History reviewed. No pertinent family history. Family Psychiatric  History: brother with mental illness Social History:  Social History   Substance and Sexual Activity  Alcohol Use Yes  . Alcohol/week: 1.2 oz  . Types: 2 Glasses of wine per week  . Frequency: Never     Social History   Substance and Sexual Activity  Drug Use Yes  . Types: Methamphetamines    Social History   Socioeconomic History  . Marital status: Single    Spouse name: Not on file  . Number of children: Not on file  . Years of education: Not on file  . Highest education level: Not on file  Occupational History  . Not on file  Social Needs  . Financial resource strain:  Not on file  . Food insecurity:    Worry: Not on file    Inability: Not on file  . Transportation needs:    Medical: Not on file    Non-medical: Not on file  Tobacco Use  . Smoking status: Light Tobacco Smoker    Packs/day: 0.25  . Smokeless tobacco: Never Used  Substance and Sexual Activity  . Alcohol use: Yes    Alcohol/week: 1.2 oz    Types: 2 Glasses of wine per week    Frequency: Never  . Drug use: Yes    Types: Methamphetamines  . Sexual activity: Yes  Lifestyle  . Physical activity:    Days per week: Not on file    Minutes per session: Not on file  . Stress: Not on file  Relationships  . Social connections:    Talks on phone: Not on file    Gets together: Not on file    Attends religious service: Not on file    Active member of club or organization: Not on file    Attends meetings of clubs or organizations: Not on file    Relationship status: Not on file  Other Topics Concern  . Not on file  Social History Narrative  . Not on file   Additional Social History:                         Sleep: Fair  Appetite:  Fair  Current  Medications: Current Facility-Administered Medications  Medication Dose Route Frequency Provider Last Rate Last Dose  . alum & mag hydroxide-simeth (MAALOX/MYLANTA) 200-200-20 MG/5ML suspension 30 mL  30 mL Oral Q4H PRN Clapacs, John T, MD      . ARIPiprazole ER (ABILIFY MAINTENA) injection 400 mg  400 mg Intramuscular Q28 days Kalmen Lollar B, MD   400 mg at 02/21/18 1502  . divalproex (DEPAKOTE) DR tablet 500 mg  500 mg Oral Q12H Kimberle Stanfill B, MD   500 mg at 02/26/18 0740  . feeding supplement (ENSURE ENLIVE) (ENSURE ENLIVE) liquid 237 mL  237 mL Oral TID BM Eliezer Khawaja B, MD   237 mL at 02/26/18 1349  . magnesium hydroxide (MILK OF MAGNESIA) suspension 30 mL  30 mL Oral Daily PRN Clapacs, John T, MD      . multivitamin with minerals tablet 1 tablet  1 tablet Oral Daily Katiana Ruland B, MD   1 tablet  at 02/26/18 0740  . QUEtiapine (SEROQUEL) tablet 600 mg  600 mg Oral QHS Jazsmine Macari B, MD   600 mg at 02/25/18 2128  . vitamin B-12 (CYANOCOBALAMIN) tablet 1,000 mcg  1,000 mcg Oral Daily Arley Garant B, MD   1,000 mcg at 02/26/18 0740    Lab Results: No results found for this or any previous visit (from the past 48 hour(s)).  Blood Alcohol level:  Lab Results  Component Value Date   ETH <10 02/11/2018    Metabolic Disorder Labs: Lab Results  Component Value Date   HGBA1C 5.4 02/12/2018   MPG 108 02/12/2018   No results found for: PROLACTIN Lab Results  Component Value Date   CHOL 170 02/12/2018   TRIG 63 02/12/2018   HDL 46 02/12/2018   CHOLHDL 3.7 02/12/2018   VLDL 13 02/12/2018   LDLCALC 111 (H) 02/12/2018    Physical Findings: AIMS: Facial and Oral Movements Muscles of Facial Expression: None, normal Lips and Perioral Area: None, normal Jaw: None, normal Tongue: None, normal,Extremity Movements Upper (arms, wrists, hands, fingers): None, normal Lower (legs, knees, ankles, toes): None, normal, Trunk Movements Neck, shoulders, hips: None, normal, Overall Severity Severity of abnormal movements (highest score from questions above): None, normal Incapacitation due to abnormal movements: None, normal Patient's awareness of abnormal movements (rate only patient's report): No Awareness, Dental Status Current problems with teeth and/or dentures?: No Does patient usually wear dentures?: No  CIWA:  CIWA-Ar Total: 3 COWS:  COWS Total Score: 1  Musculoskeletal: Strength & Muscle Tone: within normal limits Gait & Station: normal Patient leans: N/A  Psychiatric Specialty Exam: Physical Exam  Nursing note and vitals reviewed. Psychiatric: Her affect is blunt. Her speech is delayed. She is withdrawn and actively hallucinating. Thought content is paranoid and delusional. Cognition and memory are impaired. She expresses impulsivity.    Review of Systems   Neurological: Negative.   Psychiatric/Behavioral: Positive for hallucinations.  All other systems reviewed and are negative.   Blood pressure 131/70, pulse (!) 108, temperature 98.1 F (36.7 C), temperature source Oral, resp. rate 16, height 5\' 5"  (1.651 m), weight 55.3 kg (122 lb), last menstrual period 02/10/2018, SpO2 100 %.Body mass index is 20.3 kg/m.  General Appearance: Disheveled  Eye Contact:  Minimal  Speech:  Clear and Coherent  Volume:  Normal  Mood:  Irritable  Affect:  Congruent  Thought Process:  Disorganized and Descriptions of Associations: Tangential  Orientation:  Full (Time, Place, and Person)  Thought Content:  Hallucinations: Auditory  Suicidal Thoughts:  No  Homicidal Thoughts:  No  Memory:  Immediate;   Poor Recent;   Poor Remote;   Poor  Judgement:  Poor  Insight:  Lacking  Psychomotor Activity:  Decreased  Concentration:  Concentration: Poor and Attention Span: Poor  Recall:  Poor  Fund of Knowledge:  Poor  Language:  Poor  Akathisia:  No  Handed:  Right  AIMS (if indicated):     Assets:  Communication Skills Physical Health Resilience  ADL's:  Intact  Cognition:  WNL  Sleep:  Number of Hours: 7.15     Treatment Plan Summary: Daily contact with patient to assess and evaluate symptoms and progress in treatment and Medication management   Ms. Reigle is a 32 year old female witha history of developmental disability but no mental illnessadmitted for psychotic break.Shewas floridly manic on admission. Agitation has resolved but the patient is still paranoid, delusional and hallucinating.  #Agitation, resolved  #Mood and psychosis, not improving -continueSeroquel to600 mg nightlyfor 2 more weeks -continueAbilify maintena400 mg injection, next dose on 8/16 -continueDepakote to 500 mg BID, VPA level74  #Insomnia -Trazodone 100 mg nightly  #UTI, completed treatment with Keflex  #Substance abuse -positive for  cannabis -unable to discuss substance abuse treatment  #Smoking cessation -nicotine patch is available  #B12 defficiency -Vit B12 1000 ug daily  #Weight loss -MVI -Ensure TID  #Labs -lipid panel, TSH and A1Care normal -EKGreviewed, NSR with QTc 423 -head CT scan is negative -pregnancy test negative  #Disposition -homeless -follow upTBE    Kristine LineaJolanta Antoin Dargis, MD 02/26/2018, 1:59 PM

## 2018-02-25 NOTE — Plan of Care (Signed)
Pt cooperative this evening. Pt out in the dayroom with peers. Pt denies SI/HI. Pt is receptive to treatment and safety maintained on unit. Will continue to monitor. Problem: Education: Goal: Knowledge of [facility] Education information/materials will improve Outcome: Progressing Goal: Emotional status will improve Outcome: Progressing Goal: Mental status will improve Outcome: Progressing Goal: Verbalization of understanding the information provided will improve Outcome: Progressing   Problem: Activity: Goal: Will verbalize the importance of balancing activity with adequate rest periods Outcome: Progressing   Problem: Education: Goal: Will be free of psychotic symptoms Outcome: Progressing Goal: Knowledge of the prescribed therapeutic regimen will improve Outcome: Progressing   Problem: Coping: Goal: Coping ability will improve Outcome: Progressing Goal: Will verbalize feelings Outcome: Progressing   Problem: Safety: Goal: Ability to remain free from injury will improve Outcome: Progressing   Problem: Self-Care: Goal: Ability to participate in self-care as condition permits will improve Outcome: Progressing

## 2018-02-26 NOTE — Tx Team (Signed)
Interdisciplinary Treatment and Diagnostic Plan Update  02/26/2018 Time of Session: 10:30am Jenna Bell MRN: [id number]  Principal Diagnosis: Bipolar I disorder, current or most recent episode manic, with psychotic features (HCC)  Secondary Diagnoses: Principal Problem:   Bipolar I disorder, current or most recent episode manic, with psychotic features (HCC) Active Problems:   Tobacco use disorder   Cannabis use disorder, moderate, dependence (HCC)   UTI (urinary tract infection)   Developmental disability   Current Medications:  Current Facility-Administered Medications  Medication Dose Route Frequency Provider Last Rate Last Dose  . alum & mag hydroxide-simeth (MAALOX/MYLANTA) 200-200-20 MG/5ML suspension 30 mL  30 mL Oral Q4H PRN Clapacs, John T, MD      . ARIPiprazole ER (ABILIFY MAINTENA) injection 400 mg  400 mg Intramuscular Q28 days Pucilowska, Jolanta B, MD   400 mg at 02/21/18 1502  . divalproex (DEPAKOTE) DR tablet 500 mg  500 mg Oral Q12H Pucilowska, Jolanta B, MD   500 mg at 02/26/18 0740  . feeding supplement (ENSURE ENLIVE) (ENSURE ENLIVE) liquid 237 mL  237 mL Oral TID BM Pucilowska, Jolanta B, MD   237 mL at 02/26/18 0913  . magnesium hydroxide (MILK OF MAGNESIA) suspension 30 mL  30 mL Oral Daily PRN Clapacs, John T, MD      . multivitamin with minerals tablet 1 tablet  1 tablet Oral Daily Pucilowska, Jolanta B, MD   1 tablet at 02/26/18 0740  . QUEtiapine (SEROQUEL) tablet 600 mg  600 mg Oral QHS Pucilowska, Jolanta B, MD   600 mg at 02/25/18 2128  . vitamin B-12 (CYANOCOBALAMIN) tablet 1,000 mcg  1,000 mcg Oral Daily Pucilowska, Jolanta B, MD   1,000 mcg at 02/26/18 0740   PTA Medications: No medications prior to admission.    Patient Stressors: Medication change or noncompliance Substance abuse  Patient Strengths: Motivation for treatment/growth Supportive family/friends  Treatment Modalities: Medication Management, Group therapy, Case management,   1 to 1 session with clinician, Psychoeducation, Recreational therapy.   Physician Treatment Plan for Primary Diagnosis: Bipolar I disorder, current or most recent episode manic, with psychotic features (HCC) Long Term Goal(s): Improvement in symptoms so as ready for discharge Improvement in symptoms so as ready for discharge   Short Term Goals: Ability to identify changes in lifestyle to reduce recurrence of condition will improve Ability to verbalize feelings will improve Ability to disclose and discuss suicidal ideas Ability to demonstrate self-control will improve Ability to identify and develop effective coping behaviors will improve Ability to maintain clinical measurements within normal limits will improve Compliance with prescribed medications will improve Ability to identify changes in lifestyle to reduce recurrence of condition will improve Ability to demonstrate self-control will improve Ability to identify triggers associated with substance abuse/mental health issues will improve  Medication Management: Evaluate patient's response, side effects, and tolerance of medication regimen.  Therapeutic Interventions: 1 to 1 sessions, Unit Group sessions and Medication administration.  Evaluation of Outcomes: Progressing  Physician Treatment Plan for Secondary Diagnosis: Principal Problem:   Bipolar I disorder, current or most recent episode manic, with psychotic features (HCC) Active Problems:   Tobacco use disorder   Cannabis use disorder, moderate, dependence (HCC)   UTI (urinary tract infection)   Developmental disability  Long Term Goal(s): Improvement in symptoms so as ready for discharge Improvement in symptoms so as ready for discharge   Short Term Goals: Ability to identify changes in lifestyle to reduce recurrence of condition will improve Ability to verbalize feelings will  improve Ability to disclose and discuss suicidal ideas Ability to demonstrate self-control  will improve Ability to identify and develop effective coping behaviors will improve Ability to maintain clinical measurements within normal limits will improve Compliance with prescribed medications will improve Ability to identify changes in lifestyle to reduce recurrence of condition will improve Ability to demonstrate self-control will improve Ability to identify triggers associated with substance abuse/mental health issues will improve     Medication Management: Evaluate patient's response, side effects, and tolerance of medication regimen.  Therapeutic Interventions: 1 to 1 sessions, Unit Group sessions and Medication administration.  Evaluation of Outcomes: Progressing   RN Treatment Plan for Primary Diagnosis: Bipolar I disorder, current or most recent episode manic, with psychotic features (HCC) Long Term Goal(s): Knowledge of disease and therapeutic regimen to maintain health will improve  Short Term Goals: Ability to participate in decision making will improve, Ability to verbalize feelings will improve, Ability to identify and develop effective coping behaviors will improve and Compliance with prescribed medications will improve  Medication Management: RN will administer medications as ordered by provider, will assess and evaluate patient's response and provide education to patient for prescribed medication. RN will report any adverse and/or side effects to prescribing provider.  Therapeutic Interventions: 1 on 1 counseling sessions, Psychoeducation, Medication administration, Evaluate responses to treatment, Monitor vital signs and CBGs as ordered, Perform/monitor CIWA, COWS, AIMS and Fall Risk screenings as ordered, Perform wound care treatments as ordered.  Evaluation of Outcomes: Progressing   LCSW Treatment Plan for Primary Diagnosis: Bipolar I disorder, current or most recent episode manic, with psychotic features (HCC) Long Term Goal(s): Safe transition to appropriate  next level of care at discharge, Engage patient in therapeutic group addressing interpersonal concerns.  Short Term Goals: Engage patient in aftercare planning with referrals and resources, Increase social support, Facilitate acceptance of mental health diagnosis and concerns, Identify triggers associated with mental health/substance abuse issues and Increase skills for wellness and recovery  Therapeutic Interventions: Assess for all discharge needs, 1 to 1 time with Social worker, Explore available resources and support systems, Assess for adequacy in community support network, Educate family and significant other(s) on suicide prevention, Complete Psychosocial Assessment, Interpersonal group therapy.  Evaluation of Outcomes: Progressing   Progress in Treatment: Attending groups: Yes. Participating in groups: Yes. Taking medication as prescribed: Yes. Toleration medication: Yes. Family/Significant other contact made: Yes, individual(s) contacted:  Patients mother Patient understands diagnosis: Yes. Discussing patient identified problems/goals with staff: Yes. Medical problems stabilized or resolved: Yes. Denies suicidal/homicidal ideation: Yes. Issues/concerns per patient self-inventory: No. Other:   New problem(s) identified: No, Describe:  None  New Short Term/Long Term Goal(s): No goal  Patient Goals:  No goal  Discharge Plan or Barriers: To go to a shelter and follow up with outpatient treatment.  Reason for Continuation of Hospitalization: Medication stabilization  Estimated Length of Stay: 7 days  Recreational Therapy: Patient Stressors: N/A Patient Goal: Patient will engage in interactions with peers and staff in pro-social manner at least 2x within 5 recreation therapy group sessions  Attendees: Patient:  02/26/2018 10:59 AM  Physician: Dr. Jennet Maduro, MD 02/26/2018 10:59 AM  Nursing: Leonia Reader, RN 02/26/2018 10:59 AM  RN Care Manager: 02/26/2018 10:59 AM  Social  Worker: [name], LCSWA 02/26/2018 10:59 AM  Recreational Therapist: Danella Deis. Dreama Saa, LRT 02/26/2018 10:59 AM  Other: Heidi Dach, LCSW 02/26/2018 10:59 AM  Other: Huey Romans, LCSW 02/26/2018 10:59 AM  Other: Damian Leavell, Chaplin 02/26/2018 10:59 AM  Scribe for Treatment Team: Johny ShearsCassandra  Astha Probasco, Alexander MtLCSW 02/26/2018 10:59 AM

## 2018-02-26 NOTE — Progress Notes (Signed)
Recreation Therapy Notes  Date: 02/26/2018  Time: 9:30 am  Location: Craft Room  Behavioral response: Appropriate    Intervention Topic: Communication  Discussion/Intervention:  Group content today was focused on communication. The group defined communication and ways to communicate with others. Individuals stated reason why communication is important and some reasons to communicate with others. Patients expressed if they thought they were good at communicating with others and ways they could improve their communication skills. The group identified important parts of communication and some experiences they have had in the past with communication. The group participated in the intervention "What is that?", where they had a chance to test out their communication skills and identify ways to improve their communication techniques.  Clinical Observations/Feedback:  Patient came to group and defined communication as talking things out. She stated that one way to improve her communication skills is to be clear. She participated in the intervention during group.   Kourtnee Lahey LRT/CTRS         Ahmiya Abee 02/26/2018 1:00 PM

## 2018-02-26 NOTE — [facility] Group Notes (Signed)
[facility] Group Notes:  (Nursing/MHT/Case Management/Adjunct)  Date:  02/26/2018  Time:  6:29 PM  Type of Therapy:  Psychoeducational Skills  Participation Level:  Did Not Attend   Lynelle SmokeCara Travis Boone Memorial HospitalMadoni 02/26/2018, 6:29 PM

## 2018-02-26 NOTE — Progress Notes (Signed)
[facility]  02/27/2018 10:19 AM Jenna Bell  MRN:  [id number]  Subjective:    [name] is in her room hallucinating again this morning. She "allows" me to have a conversation today. She is "fine", goes to groups but otherwise "she has no business with other patients and stays in her room". She is unconcerned with a prospect of going to the homeless shelter as she now believes that she has "lots of money" to pay for hotel. She was arrested not long ago for not being able to pay a taxi driver who took her to [place].   She accepts medications now. She reports no side effects. There are no somatic complaints.   Principal Problem: Bipolar I disorder, current or most recent episode manic, with psychotic features (HCC) Diagnosis:   Patient Active Problem List   Diagnosis Date Noted  . Bipolar I disorder, current or most recent episode manic, with psychotic features (HCC) [F31.2] 02/11/2018    Priority: High  . Developmental disability [F89] 02/20/2018  . Tobacco use disorder [F17.200] 02/12/2018  . Cannabis use disorder, moderate, dependence (HCC) [F12.20] 02/12/2018  . UTI (urinary tract infection) [N39.0] 02/12/2018   Total Time spent with patient: 20 minutes  Past Psychiatric History: intellectual disability  Past Medical History:  Past Medical History:  Diagnosis Date  . Bipolar disorder (HCC)    History reviewed. No pertinent surgical history. Family History: History reviewed. No pertinent family history. Family Psychiatric  History: brother with mental illness Social History:  Social History   Substance and Sexual Activity  Alcohol Use Yes  . Alcohol/week: 1.2 oz  . Types: 2 Glasses of wine per week  . Frequency: Never     Social History   Substance and Sexual Activity  Drug Use Yes  . Types: Methamphetamines    Social History   Socioeconomic History  . Marital status: Single    Spouse name: Not on file  . Number of children: Not on file   . Years of education: Not on file  . Highest education level: Not on file  Occupational History  . Not on file  Social Needs  . Financial resource strain: Not on file  . Food insecurity:    Worry: Not on file    Inability: Not on file  . Transportation needs:    Medical: Not on file    Non-medical: Not on file  Tobacco Use  . Smoking status: Light Tobacco Smoker    Packs/day: 0.25  . Smokeless tobacco: Never Used  Substance and Sexual Activity  . Alcohol use: Yes    Alcohol/week: 1.2 oz    Types: 2 Glasses of wine per week    Frequency: Never  . Drug use: Yes    Types: Methamphetamines  . Sexual activity: Yes  Lifestyle  . Physical activity:    Days per week: Not on file    Minutes per session: Not on file  . Stress: Not on file  Relationships  . Social connections:    Talks on phone: Not on file    Gets together: Not on file    Attends religious service: Not on file    Active member of club or organization: Not on file    Attends meetings of clubs or organizations: Not on file    Relationship status: Not on file  Other Topics Concern  . Not on file  Social History Narrative  . Not on file   Additional Social History:  Sleep: Fair  Appetite:  Fair  Current Medications: Current Facility-Administered Medications  Medication Dose Route Frequency Provider Last Rate Last Dose  . alum & mag hydroxide-simeth (MAALOX/MYLANTA) 200-200-20 MG/5ML suspension 30 mL  30 mL Oral Q4H PRN Clapacs, John T, MD      . ARIPiprazole ER (ABILIFY MAINTENA) injection 400 mg  400 mg Intramuscular Q28 days Linken Mcglothen B, MD   400 mg at 02/21/18 1502  . divalproex (DEPAKOTE) DR tablet 500 mg  500 mg Oral Q12H Clearance Chenault B, MD   500 mg at 02/27/18 0818  . feeding supplement (ENSURE ENLIVE) (ENSURE ENLIVE) liquid 237 mL  237 mL Oral TID BM Laurena Valko B, MD   237 mL at 02/26/18 1951  . magnesium hydroxide (MILK OF MAGNESIA)  suspension 30 mL  30 mL Oral Daily PRN Clapacs, John T, MD      . multivitamin with minerals tablet 1 tablet  1 tablet Oral Daily Ladona Rosten B, MD   1 tablet at 02/27/18 0819  . QUEtiapine (SEROQUEL) tablet 600 mg  600 mg Oral QHS Tasheba Henson B, MD   600 mg at 02/26/18 2133  . vitamin B-12 (CYANOCOBALAMIN) tablet 1,000 mcg  1,000 mcg Oral Daily Keonna Raether B, MD   1,000 mcg at 02/27/18 0818    Lab Results: No results found for this or any previous visit (from the past 48 hour(s)).  Blood Alcohol level:  Lab Results  Component Value Date   ETH <10 02/11/2018    Metabolic Disorder Labs: Lab Results  Component Value Date   HGBA1C 5.4 02/12/2018   MPG 108 02/12/2018   No results found for: PROLACTIN Lab Results  Component Value Date   CHOL 170 02/12/2018   TRIG 63 02/12/2018   HDL 46 02/12/2018   CHOLHDL 3.7 02/12/2018   VLDL 13 02/12/2018   LDLCALC 111 (H) 02/12/2018    Physical Findings: AIMS: Facial and Oral Movements Muscles of Facial Expression: None, normal Lips and Perioral Area: None, normal Jaw: None, normal Tongue: None, normal,Extremity Movements Upper (arms, wrists, hands, fingers): None, normal Lower (legs, knees, ankles, toes): None, normal, Trunk Movements Neck, shoulders, hips: None, normal, Overall Severity Severity of abnormal movements (highest score from questions above): None, normal Incapacitation due to abnormal movements: None, normal Patient's awareness of abnormal movements (rate only patient's report): No Awareness, Dental Status Current problems with teeth and/or dentures?: No Does patient usually wear dentures?: No  CIWA:  CIWA-Ar Total: 3 COWS:  COWS Total Score: 1  Musculoskeletal: Strength & Muscle Tone: within normal limits Gait & Station: normal Patient leans: N/A  Psychiatric Specialty Exam: Physical Exam  Nursing note and vitals reviewed. Psychiatric: Her speech is normal. Her affect is blunt. She is  withdrawn and actively hallucinating. Thought content is paranoid and delusional. Cognition and memory are impaired. She expresses impulsivity.    Review of Systems  Neurological: Negative.   Psychiatric/Behavioral: Positive for hallucinations.  All other systems reviewed and are negative.   Blood pressure 113/82, pulse (!) 106, temperature 98.1 F (36.7 C), temperature source Oral, resp. rate 16, height 5\' 5"  (1.651 m), weight 55.3 kg (122 lb), last menstrual period 02/10/2018, SpO2 100 %.Body mass index is 20.3 kg/m.  General Appearance: Casual  Eye Contact:  Good  Speech:  Slow  Volume:  Normal  Mood:  Dysphoric  Affect:  Inappropriate  Thought Process:  Disorganized, Irrelevant and Descriptions of Associations: Loose  Orientation:  Negative  Thought Content:  Illogical, Delusions, Hallucinations:  Auditory and Paranoid Ideation  Suicidal Thoughts:  No  Homicidal Thoughts:  No  Memory:  Immediate;   Poor Recent;   Poor Remote;   Poor  Judgement:  Poor  Insight:  Lacking  Psychomotor Activity:  Psychomotor Retardation  Concentration:  Concentration: Poor and Attention Span: Poor  Recall:  Poor  Fund of Knowledge:  Poor  Language:  Poor  Akathisia:  No  Handed:  Right  AIMS (if indicated):     Assets:  Communication Skills Desire for Improvement Physical Health Resilience  ADL's:  Intact  Cognition:  WNL  Sleep:  Number of Hours: 7     Treatment Plan Summary: Daily contact with patient to assess and evaluate symptoms and progress in treatment and Medication management   Ms. Babic is a 32 year old female witha history of developmental disability but no mental illnessadmitted for psychotic break.Shewas floridly manic on admission. Agitation has resolved but the patient is still paranoid, delusional and hallucinating.   #Mood and psychosis,slightly improved  -continueSeroquel to600 mg nightlyfor 2 more weeks -continueAbilify maintena400 mg injection, next  dose on 8/16 -continueDepakote to 500 mg BID, VPA level74  #Insomnia, improved with treatment -Trazodone 100 mg nightly  #UTI, completed treatment with Keflex  #Substance abuse -positive for cannabis -unable to discuss substance abuse treatment  #Smoking cessation -nicotine patch is available  #B12 defficiency -Vit B12 1000 ug daily  #Weight loss -MVI -Ensure TID  #Labs -lipid panel, TSH and A1Care normal -EKGreviewed, NSR with QTc 423 -head CT scan is negative -pregnancy test negative  #Disposition -homeless -follow upTBE    Kristine Linea, MD 02/27/2018, 10:19 AM

## 2018-02-26 NOTE — Progress Notes (Signed)
D: Pt denies SI/HI/AVH. Pt is pleasant and cooperative, but forwards little and can be very minimal. Pt. Frequently isolative and withdrawn to her room resting. Pt. has no Complaints.  Patient interaction appropriate, but minimal.   A: Q x 15 minute observation checks were completed for safety. Patient was provided with education, but needs reinforcement.  Patient was given scheduled medications. Patient  was encourage to attend groups, participate in unit activities and continue with plan of care. Pt. Chart and plans of care reviewed. Pt. Given support and encouragement.   R: Patient is complaint with medication and unit procedures.             Precautionary checks every 15 minutes for safety maintained, room free of safety hazards, patient sustains no injury or falls during this shift. Will endorse care to next shift.

## 2018-02-26 NOTE — Plan of Care (Signed)
Pt. More receptive to patient education, but continues to need reinforcement on provided educations. Pt. Mental and emotional status less labile and agitated today. Pt. Is compliant this morning with medications. Pt. Is monitored for safety by staff.    Problem: Education: Goal: Knowledge of [place] Education information/materials will improve Outcome: Progressing Goal: Emotional status will improve Outcome: Progressing Goal: Mental status will improve Outcome: Progressing   Problem: Health Behavior/Discharge Planning: Goal: Compliance with prescribed medication regimen will improve Outcome: Progressing   Problem: Safety: Goal: Ability to remain free from injury will improve Outcome: Progressing   Problem: Safety: Goal: Ability to remain free from injury will improve Outcome: Progressing

## 2018-02-26 NOTE — [facility] Group Notes (Signed)
LCSW Group Therapy Note  02/26/2018 1:00 pm  Type of Therapy/Topic:  Group Therapy:  Emotion Regulation  Participation Level:  Did Not Attend   Description of Group:    The purpose of this group is to assist patients in learning to regulate negative emotions and experience positive emotions. Patients will be guided to discuss ways in which they have been vulnerable to their negative emotions. These vulnerabilities will be juxtaposed with experiences of positive emotions or situations, and patients will be challenged to use positive emotions to combat negative ones. Special emphasis will be placed on coping with negative emotions in conflict situations, and patients will process healthy conflict resolution skills.  Therapeutic Goals: 1. Patient will identify two positive emotions or experiences to reflect on in order to balance out negative emotions 2. Patient will label two or more emotions that they find the most difficult to experience 3. Patient will demonstrate positive conflict resolution skills through discussion and/or role plays  Summary of Patient Progress:  Jenna Bell was invited to today's group, but chose not to attend.     Therapeutic Modalities:   Cognitive Behavioral Therapy Feelings Identification Dialectical Behavioral Therapy

## 2018-02-27 NOTE — Progress Notes (Signed)
[facility] MD Progress Note  02/28/2018 10:18 AM Jenna Bell  MRN:  [id number]  Subjective:    Jenna Bell is a 31yo female with intellectual disability who presented with first episode psychosis, most likely bipolar mania. She has been on Seroquel and Depakote. She received Abilify maintena injection as she is unlikely to comply with treatment. She has no insight or any resources. Still paranoid, delusional and hallucinating.  One can hear Jenna Bell talking to herself as we approach her room. She adamantly denies hearing voices. Denies any symptoms of depression, anxiety or psychosis. She is not suicidal or homicidal. No somatic complaints. She is very paranoid and at times grandiose. She tells me that she has a lot of money in the bank then asks about hotel vouchers. Sh is unable to participate on discharge planning. She just started going to groups. No insight into mental illness but accepts medications.   Principal Problem: Bipolar I disorder, current or most recent episode manic, with psychotic features (HCC) Diagnosis:   Patient Active Problem List   Diagnosis Date Noted  . Bipolar I disorder, current or most recent episode manic, with psychotic features (HCC) [F31.2] 02/11/2018    Priority: High  . Developmental disability [F89] 02/20/2018  . Tobacco use disorder [F17.200] 02/12/2018  . Cannabis use disorder, moderate, dependence (HCC) [F12.20] 02/12/2018  . UTI (urinary tract infection) [N39.0] 02/12/2018   Total Time spent with patient: 20 minutes  Past Psychiatric History: intellectual disability  Past Medical History:  Past Medical History:  Diagnosis Date  . Bipolar disorder (HCC)    History reviewed. No pertinent surgical history. Family History: History reviewed. No pertinent family history. Family Psychiatric  History: brother with mental illness Social History:  Social History   Substance and Sexual Activity  Alcohol Use Yes  . Alcohol/week: 1.2 oz  . Types: 2  Glasses of wine per week  . Frequency: Never     Social History   Substance and Sexual Activity  Drug Use Yes  . Types: Methamphetamines    Social History   Socioeconomic History  . Marital status: Single    Spouse name: Not on file  . Number of children: Not on file  . Years of education: Not on file  . Highest education level: Not on file  Occupational History  . Not on file  Social Needs  . Financial resource strain: Not on file  . Food insecurity:    Worry: Not on file    Inability: Not on file  . Transportation needs:    Medical: Not on file    Non-medical: Not on file  Tobacco Use  . Smoking status: Light Tobacco Smoker    Packs/day: 0.25  . Smokeless tobacco: Never Used  Substance and Sexual Activity  . Alcohol use: Yes    Alcohol/week: 1.2 oz    Types: 2 Glasses of wine per week    Frequency: Never  . Drug use: Yes    Types: Methamphetamines  . Sexual activity: Yes  Lifestyle  . Physical activity:    Days per week: Not on file    Minutes per session: Not on file  . Stress: Not on file  Relationships  . Social connections:    Talks on phone: Not on file    Gets together: Not on file    Attends religious service: Not on file    Active member of club or organization: Not on file    Attends meetings of clubs or organizations: Not on file  Relationship status: Not on file  Other Topics Concern  . Not on file  Social History Narrative  . Not on file   Additional Social History:                         Sleep: Fair  Appetite:  Fair  Current Medications: Current Facility-Administered Medications  Medication Dose Route Frequency Provider Last Rate Last Dose  . alum & mag hydroxide-simeth (MAALOX/MYLANTA) 200-200-20 MG/5ML suspension 30 mL  30 mL Oral Q4H PRN Clapacs, John T, MD      . ARIPiprazole ER (ABILIFY MAINTENA) injection 400 mg  400 mg Intramuscular Q28 days Abdulla Pooley B, MD   400 mg at 02/21/18 1502  . divalproex  (DEPAKOTE) DR tablet 500 mg  500 mg Oral Q12H Dane Kopke B, MD   500 mg at 02/28/18 0816  . feeding supplement (ENSURE ENLIVE) (ENSURE ENLIVE) liquid 237 mL  237 mL Oral TID BM Vallory Oetken B, MD   237 mL at 02/27/18 1959  . magnesium hydroxide (MILK OF MAGNESIA) suspension 30 mL  30 mL Oral Daily PRN Clapacs, John T, MD      . multivitamin with minerals tablet 1 tablet  1 tablet Oral Daily Allene Furuya B, MD   1 tablet at 02/28/18 0815  . QUEtiapine (SEROQUEL) tablet 600 mg  600 mg Oral QHS Birt Reinoso B, MD   600 mg at 02/27/18 2131  . vitamin B-12 (CYANOCOBALAMIN) tablet 1,000 mcg  1,000 mcg Oral Daily Rigo Letts B, MD   1,000 mcg at 02/28/18 0816    Lab Results: No results found for this or any previous visit (from the past 48 hour(s)).  Blood Alcohol level:  Lab Results  Component Value Date   ETH <10 02/11/2018    Metabolic Disorder Labs: Lab Results  Component Value Date   HGBA1C 5.4 02/12/2018   MPG 108 02/12/2018   No results found for: PROLACTIN Lab Results  Component Value Date   CHOL 170 02/12/2018   TRIG 63 02/12/2018   HDL 46 02/12/2018   CHOLHDL 3.7 02/12/2018   VLDL 13 02/12/2018   LDLCALC 111 (H) 02/12/2018    Physical Findings: AIMS: Facial and Oral Movements Muscles of Facial Expression: None, normal Lips and Perioral Area: None, normal Jaw: None, normal Tongue: None, normal,Extremity Movements Upper (arms, wrists, hands, fingers): None, normal Lower (legs, knees, ankles, toes): None, normal, Trunk Movements Neck, shoulders, hips: None, normal, Overall Severity Severity of abnormal movements (highest score from questions above): None, normal Incapacitation due to abnormal movements: None, normal Patient's awareness of abnormal movements (rate only patient's report): No Awareness, Dental Status Current problems with teeth and/or dentures?: No Does patient usually wear dentures?: No  CIWA:  CIWA-Ar Total: 3 COWS:   COWS Total Score: 1  Musculoskeletal: Strength & Muscle Tone: within normal limits Gait & Station: normal Patient leans: N/A  Psychiatric Specialty Exam: Physical Exam  Nursing note and vitals reviewed. Psychiatric: Her speech is normal. Her affect is blunt. She is withdrawn and actively hallucinating. Thought content is paranoid and delusional. Cognition and memory are impaired. She expresses impulsivity.    Review of Systems  Neurological: Negative.   Psychiatric/Behavioral: Positive for hallucinations.  All other systems reviewed and are negative.   Blood pressure 110/83, pulse 99, temperature 97.6 F (36.4 C), temperature source Oral, resp. rate 18, height 5\' 5"  (1.651 m), weight 55.3 kg (122 lb), last menstrual period 02/10/2018, SpO2 100 %.Body mass  index is 20.3 kg/m.  General Appearance: Fairly Groomed  Eye Contact:  Good  Speech:  Clear and Coherent  Volume:  Normal  Mood:  Euthymic  Affect:  Flat  Thought Process:  Disorganized and Descriptions of Associations: Tangential  Orientation:  Full (Time, Place, and Person)  Thought Content:  Delusions, Hallucinations: Auditory and Paranoid Ideation  Suicidal Thoughts:  No  Homicidal Thoughts:  No  Memory:  Immediate;   Poor Recent;   Poor Remote;   Poor  Judgement:  Poor  Insight:  Lacking  Psychomotor Activity:  Psychomotor Retardation  Concentration:  Concentration: Poor and Attention Span: Poor  Recall:  Poor  Fund of Knowledge:  Fair  Language:  Fair  Akathisia:  No  Handed:  Right  AIMS (if indicated):     Assets:  Desire for Improvement Physical Health Resilience  ADL's:  Intact  Cognition:  WNL  Sleep:  Number of Hours: 6.3     Treatment Plan Summary: Daily contact with patient to assess and evaluate symptoms and progress in treatment and Medication management   Ms. Wichert is a 32 year old female witha history of developmental disability but no mental illnessadmitted for psychotic break.Shewas  floridly manic on admission. Agitation has resolved but the patient is still paranoid, delusional and hallucinating.   #Mood and psychosis,slightly improved  -continueSeroquel to600 mg nightlyfor 2 more weeks -continueAbilify maintena400 mg injection, next dose on 8/16 -continueDepakote to 500 mg BID, VPA level74  #Insomnia, improved with treatment -Trazodone 100 mg nightly  #UTI, completed treatment with Keflex  #Substance abuse -positive for cannabis -unable to discuss substance abuse treatment  #Smoking cessation -nicotine patch is available  #B12 defficiency -Vit B12 1000 ug daily  #Weight loss -MVI -Ensure TID  #Labs -lipid panel, TSH and A1Care normal -EKGreviewed, NSR with QTc 423 -head CT scan is negative -pregnancy test negative  #Disposition -homeless -follow upTBE     Kristine Linea, MD 02/28/2018, 10:18 AM

## 2018-02-27 NOTE — B[facility] Group Notes (Signed)
  02/27/2018  Time: 1PM  Type of Therapy/Topic:  Group Therapy:  Balance in Life  Participation Level:  Did Not Attend  Description of Group:   This group will address the concept of balance and how it feels and looks when one is unbalanced. Patients will be encouraged to process areas in their lives that are out of balance and identify reasons for remaining unbalanced. Facilitators will guide patients in utilizing problem-solving interventions to address and correct the stressor making their life unbalanced. Understanding and applying boundaries will be explored and addressed for obtaining and maintaining a balanced life. Patients will be encouraged to explore ways to assertively make their unbalanced needs known to significant others in their lives, using other group members and facilitator for support and feedback.  Therapeutic Goals: 1. Patient will identify two or more emotions or situations they have that consume much of in their lives. 2. Patient will identify signs/triggers that life has become out of balance:  3. Patient will identify two ways to set boundaries in order to achieve balance in their lives:  4. Patient will demonstrate ability to communicate their needs through discussion and/or role plays  Summary of Patient Progress: Pt was invited to attend group but chose not to attend. CSW will continue to encourage pt to attend group throughout their admission.    Therapeutic Modalities:   Cognitive Behavioral Therapy Solution-Focused Therapy Assertiveness Training  Heidi DachKelsey Dayquan Buys, MSW, LCSW Clinical Social Worker 02/27/2018 1:51 PM

## 2018-02-27 NOTE — Plan of Care (Signed)
Jenna Bell is actually improving, engaging, interacting, visible in the milieu, still responding to internal stimuli but not as bad, disheveled still but not as bad, has not been talking about pregnancy (pseudocyesis) during this shift; up and about, complied with medications at bedtime.  Patient slept for Estimated Hours of 7; Precautionary checks every 15 minutes for safety maintained, room free of safety hazards, patient sustains no injury or falls during this shift.  Problem: Education: Goal: Emotional status will improve Outcome: Progressing Goal: Mental status will improve Outcome: Progressing   Problem: Education: Goal: Will be free of psychotic symptoms Outcome: Progressing   Problem: Coping: Goal: Coping ability will improve Outcome: Progressing Goal: Will verbalize feelings Outcome: Progressing   Problem: Health Behavior/Discharge Planning: Goal: Compliance with prescribed medication regimen will improve Outcome: Progressing   Problem: Role Relationship: Goal: Ability to interact with others will improve Outcome: Progressing   Problem: Safety: Goal: Ability to remain free from injury will improve Outcome: Progressing

## 2018-02-27 NOTE — Progress Notes (Signed)
Recreation Therapy Notes  Date: 02/27/2018  Time: 9:30 am   Location: Craft Room   Behavioral response: N/A   Intervention Topic: Happiness  Discussion/Intervention: Patient did not attend group.   Clinical Observations/Feedback:  Patient did not attend group.   Terrence Pizana LRT/CTRS        Maleena Eddleman 02/27/2018 11:00 AM

## 2018-02-27 NOTE — Progress Notes (Signed)

## 2018-02-27 NOTE — [facility] Group Notes (Signed)
LCSW Group Therapy Note 02/27/2018 9:00 AM  Type of Therapy and Topic:  Group Therapy:  Setting Goals  Participation Level:  Minimal  Description of Group: In this process group, patients discussed using strengths to work toward goals and address challenges.  Patients identified two positive things about themselves and one goal they were working on.  Patients were given the opportunity to share openly and support each other's plan for self-empowerment.  The group discussed the value of gratitude and were encouraged to have a daily reflection of positive characteristics or circumstances.  Patients were encouraged to identify a plan to utilize their strengths to work on current challenges and goals.  Therapeutic Goals 1. Patient will verbalize personal strengths/positive qualities and relate how these can assist with achieving desired personal goals 2. Patients will verbalize affirmation of peers plans for personal change and goal setting 3. Patients will explore the value of gratitude and positive focus as related to successful achievement of goals 4. Patients will verbalize a plan for regular reinforcement of personal positive qualities and circumstances.  Summary of Patient Progress: Glee ArvinLatoya did not engage much in the discussion on setting goals using the SMART Model, but she was attentive and shared that she has a good understanding of how she can use the SMART Model to establish her own goals.  Sharea shared that she has a goal to work on finishing her 4th degree (college) when she is discharged from the hospital.         Therapeutic Modalities Cognitive Behavioral Therapy Motivational Interviewing    Alease FrameSonya S Tran Randle, [place] 02/27/2018 12:35 PM

## 2018-02-28 NOTE — B[facility] Counselor (Signed)
CSW sat down with the patient to discuss her discharge plan. Pt. Reports that she can go to a hotel and that she has a Pharmacist, hospitalresource list. CSW told the patient that the resource list that she has is out of date and that she can help refer her to a program [facility] to better assist her. Patient agreed to complete the intake form and assisted the CSW in filling it out online. Patient signed a consent for the CSW to speak with [facility]. CSW will follow up with [facility] to schedule a phone interview with the patient. Patient was still responding to some internal stimuli but was able to report needing some community resources. After speaking with her she thinks that [facility] will be a good fit for her. Patient still has a delusion that she thinks she is pregnant. Pt. Reports that she has not spoke with her mother as she is not responding to her phone calls. She did list her mother as her emergency contact on the [facility]. She thanked the CSW and returned back to her room.  Johny Shearsassandra Loany Neuroth, MSW, Theresia MajorsLCSWA, Bridget HartshornLCASA Clinical Social Worker 02/28/2018 11:21 AM

## 2018-02-28 NOTE — [facility] Group Notes (Signed)
[facility] Group Notes:  (Nursing/MHT/Case Management/Adjunct)  Date:  02/28/2018  Time:  3:39 AM  Type of Therapy:  Group Therapy  Participation Level:  Minimal  Participation Quality:  Talking to her self out loud  Jenna NeerJackie L Alegandra Bell 02/28/2018, 3:39 AM

## 2018-02-28 NOTE — Progress Notes (Signed)
Recreation Therapy Notes   Date: 02/28/2018  Time: 9:30 am  Location: Craft Room  Behavioral response: Appropriate    Intervention Topic: Coping Skills  Discussion/Intervention:  Group content on today was focused on coping skills. The group defined what coping skills are and when they can be used. Individuals described how they normally cope with things and the coping skills they normally use. Patients expressed why it is important to cope with things and how not coping with things can affect you. The group participated in the intervention "My coping box" and made coping boxes while adding coping skills they could use in the future to the box. Clinical Observations/Feedback:  Patient came to group and late due to unknown reasons. She participated in the intervention but appeared to be responding to internal stimuli. Jenna Bell LRT/CTRS            Jenna Bell 02/28/2018 12:17 PM

## 2018-02-28 NOTE — Plan of Care (Signed)
Patient is alert, denies SI and HI. Patient is actively experiencing auditory hallucinations as evidenced by loudly talking to self and laughing. Patient coherency is disorganized thinking, appearance is disheveled ; patient is paranoid as well. Patient is compliant with medications. Nurse will continue to monitor. Problem: Education: Goal: Knowledge of [facility] Education information/materials will improve Outcome: Not Progressing Goal: Emotional status will improve Outcome: Progressing Goal: Mental status will improve Outcome: Progressing Goal: Verbalization of understanding the information provided will improve Outcome: Not Progressing   Problem: Activity: Goal: Will verbalize the importance of balancing activity with adequate rest periods Outcome: Not Progressing   Problem: Education: Goal: Will be free of psychotic symptoms Outcome: Not Progressing Goal: Knowledge of the prescribed therapeutic regimen will improve Outcome: Not Progressing

## 2018-02-28 NOTE — [facility] Group Notes (Signed)
02/28/2018 1PM  Type of Therapy and Topic:  Group Therapy:  Feelings around Relapse and Recovery  Participation Level:  Active   Description of Group:    Patients in this group will discuss emotions they experience before and after a relapse. They will process how experiencing these feelings, or avoidance of experiencing them, relates to having a relapse. Facilitator will guide patients to explore emotions they have related to recovery. Patients will be encouraged to process which emotions are more powerful. They will be guided to discuss the emotional reaction significant others in their lives may have to patients' relapse or recovery. Patients will be assisted in exploring ways to respond to the emotions of others without this contributing to a relapse.  Therapeutic Goals: 1. Patient will identify two or more emotions that lead to a relapse for them 2. Patient will identify two emotions that result when they relapse 3. Patient will identify two emotions related to recovery 4. Patient will demonstrate ability to communicate their needs through discussion and/or role plays   Summary of Patient Progress: Actively and appropriately engaged in the group. Patient was able to provide support and validation to other group members.Patient practiced active listening when interacting with the facilitator and other group members. Jenna Bell reports that stress caused her depression. She says that she can communicate her needs by "talking to other people." During the group she was still responding to some internal stimuli but was engaging in the discussion.Patient is still in the process of obtaining treatment goals.      Therapeutic Modalities:   Cognitive Behavioral Therapy Solution-Focused Therapy Assertiveness Training Relapse Prevention Therapy   Jenna ShearsCassandra  Brayleigh Rybacki, LCSW 02/28/2018 3:06 PM

## 2018-02-28 NOTE — Plan of Care (Signed)
Visible in the milieu, appearance slightly better, "hanging in there, that's all I can do right now; do you know when my papers will ready?.." Still responding to internal stimuli, talking and having dialogue out loudly with self; denied SI/HI/AVH.  Patient slept for Estimated Hours of 6.30; Precautionary checks every 15 minutes for safety maintained, room free of safety hazards, patient sustains no injury or falls during this shift.  Problem: Education: Goal: Emotional status will improve Outcome: Progressing Goal: Mental status will improve Outcome: Progressing Goal: Verbalization of understanding the information provided will improve Outcome: Progressing   Problem: Education: Goal: Knowledge of the prescribed therapeutic regimen will improve Outcome: Progressing   Problem: Coping: Goal: Will verbalize feelings Outcome: Progressing   Problem: Health Behavior/Discharge Planning: Goal: Compliance with prescribed medication regimen will improve Outcome: Progressing   Problem: Role Relationship: Goal: Ability to interact with others will improve Outcome: Progressing   Problem: Safety: Goal: Ability to remain free from injury will improve Outcome: Progressing   Problem: Education: Goal: Will be free of psychotic symptoms Outcome: Not Progressing

## 2018-03-01 NOTE — Plan of Care (Signed)
D: Pt denies SI/HI/AV hallucinations. Pt is pleasant and cooperative. Pt interacts with peers. Patient has been in milieu for a short while today. A: Pt was offered support and encouragement. Pt was given scheduled medications. Pt was encourage to attend groups. Q 15 minute checks were done for safety.  R: Pt is taking medication. Pt has no complaints.Pt receptive to treatment and safety maintained on unit.    Problem: Safety: Goal: Ability to remain free from injury will improve Outcome: Progressing

## 2018-03-01 NOTE — Plan of Care (Signed)
Pt. Is compliant with medications. Pt. Interactions with peers and staff appropriate. Pt. Monitored for safety by staff. Pt. Denies SI/HI. Pt. Needs reinforcement on provided education. Pt. Observed this evening responding to internal stimuli.   Problem: Education: Goal: Knowledge of [employer] [facility] Education information/materials will improve 03/01/2018 0024 by Lenox PondsStevens, Julieta Rogalski J, RN Outcome: Not Progressing 03/01/2018 0023 by Lenox PondsStevens, Shantanu Strauch J, RN Outcome: Progressing   Problem: Education: Goal: Will be free of psychotic symptoms 03/01/2018 0024 by Lenox PondsStevens, Lileigh Fahringer J, RN Outcome: Not Progressing 03/01/2018 0023 by Lenox PondsStevens, Leydi Winstead J, RN Outcome: Not Progressing Goal: Knowledge of the prescribed therapeutic regimen will improve 03/01/2018 0024 by Lenox PondsStevens, Jame Seelig J, RN Outcome: Not Progressing 03/01/2018 0023 by Lenox PondsStevens, Dimond Crotty J, RN Outcome: Not Progressing   Problem: Health Behavior/Discharge Planning: Goal: Compliance with prescribed medication regimen will improve 03/01/2018 0024 by Lenox PondsStevens, Jemery Stacey J, RN Outcome: Progressing 03/01/2018 0023 by Lenox PondsStevens, Keymon Mcelroy J, RN Outcome: Progressing   Problem: Role Relationship: Goal: Ability to interact with others will improve Outcome: Progressing   Problem: Safety: Goal: Ability to remain free from injury will improve 03/01/2018 0024 by Lenox PondsStevens, Shelonda Saxe J, RN Outcome: Progressing 03/01/2018 0023 by Lenox PondsStevens, Jo Booze J, RN Outcome: Progressing   Problem: Safety: Goal: Ability to remain free from injury will improve 03/01/2018 0024 by Lenox PondsStevens, Shakela Donati J, RN Outcome: Progressing 03/01/2018 0023 by Lenox PondsStevens, Deveron Shamoon J, RN Outcome: Progressing

## 2018-03-01 NOTE — Progress Notes (Signed)
D: Pt denies SI/HI/AVH. Pt. Verbally is able to contract for safety. Pt. Actively hallucinating and responding to internal stimuli talking to the voices she is hearing. Pt. Presents with continued paranoia. Pt. Frequently observed visible in the milieu and around the unit. Pt. Expresses less delusional statements this evening, but frequently forwards little. Pt. Refuses to attend to hygienes despite encouragements. Pt. Facial expression frequently flat.      A: Q x 15 minute observation checks were completed for safety. Patient was provided with education, but needs reinforcement.  Patient was given medications per orders. Patient  was encourage to attend groups, participate in unit activities and continue with plan of care. Pt. Chart and plans of care reviewed. Pt. Given support and encouragement.     R: Patient is complaint with medication and unit procedures.             Precautionary checks every 15 minutes for safety maintained, room free of safety hazards, patient sustains no injury or falls during this shift. Will endorse care to next shift.

## 2018-03-01 NOTE — [facility] Group Notes (Signed)
LCSW Group Therapy Note  03/01/2018 1:15pm  Type of Therapy and Topic:  Group Therapy:  Cognitive Distortions  Participation Level:  Did Not Attend   Description of Group:    Patients in this group will be introduced to the topic of cognitive distortions.  Patients will identify and describe cognitive distortions, describe the feelings these distortions create for them.  Patients will identify one or more situations in their personal life where they have cognitively distorted thinking and will verbalize challenging this cognitive distortion through positive thinking skills.  Patients will practice the skill of using positive affirmations to challenge cognitive distortions using affirmation cards.    Therapeutic Goals:  1. Patient will identify two or more cognitive distortions they have used 2. Patient will identify one or more emotions that stem from use of a cognitive distortion 3. Patient will demonstrate use of a positive affirmation to counter a cognitive distortion through discussion and/or role play. 4. Patient will describe one way cognitive distortions can be detrimental to wellness   Summary of Patient Progress: Pt was invited to attend group but chose not to attend. CSW will continue to encourage pt to attend group throughout their admission.      Therapeutic Modalities:   Cognitive Behavioral Therapy Motivational Interviewing   Reannah Totten  CUEBAS-COLON, LCSW 03/01/2018 3:52 PM   

## 2018-03-01 NOTE — Progress Notes (Signed)
[facility] MD Progress Note  03/01/2018 2:19 PM Jenna Bell  MRN:  [id number]  Subjective:   Pt seen and chart reviewed.  Mr. Whisnant is a 32yo female with intellectual disability who presented with first episode psychosis, most likely bipolar mania. She has been on Seroquel and Depakote. She received Abilify maintena injection 400mg  on 02/21/18, to increase medication compliance.   Currently, she continues to be irritable. First thing she stated to the writer was "I thought I was going to leave today!" she is informed that was not going to happen during weekend. She said "I am fine".  When asked about psychotic Sx, she stated "I am tired of people asking me that! I am fine. I wan to leave!"   Denied SI or HI.   Principal Problem: Bipolar I disorder, current or most recent episode manic, with psychotic features (HCC) Diagnosis:   Patient Active Problem List   Diagnosis Date Noted  . Developmental disability [F89] 02/20/2018  . Tobacco use disorder [F17.200] 02/12/2018  . Cannabis use disorder, moderate, dependence (HCC) [F12.20] 02/12/2018  . UTI (urinary tract infection) [N39.0] 02/12/2018  . Bipolar I disorder, current or most recent episode manic, with psychotic features (HCC) [F31.2] 02/11/2018   Total Time spent with patient: 20 minutes  Past Psychiatric History: intellectual disability  Past Medical History:  Past Medical History:  Diagnosis Date  . Bipolar disorder (HCC)    History reviewed. No pertinent surgical history. Family History: History reviewed. No pertinent family history. Family Psychiatric  History: brother with mental illness Social History:  Social History   Substance and Sexual Activity  Alcohol Use Yes  . Alcohol/week: 1.2 oz  . Types: 2 Glasses of wine per week  . Frequency: Never     Social History   Substance and Sexual Activity  Drug Use Yes  . Types: Methamphetamines    Social History   Socioeconomic History  . Marital status: Single     Spouse name: Not on file  . Number of children: Not on file  . Years of education: Not on file  . Highest education level: Not on file  Occupational History  . Not on file  Social Needs  . Financial resource strain: Not on file  . Food insecurity:    Worry: Not on file    Inability: Not on file  . Transportation needs:    Medical: Not on file    Non-medical: Not on file  Tobacco Use  . Smoking status: Light Tobacco Smoker    Packs/day: 0.25  . Smokeless tobacco: Never Used  Substance and Sexual Activity  . Alcohol use: Yes    Alcohol/week: 1.2 oz    Types: 2 Glasses of wine per week    Frequency: Never  . Drug use: Yes    Types: Methamphetamines  . Sexual activity: Yes  Lifestyle  . Physical activity:    Days per week: Not on file    Minutes per session: Not on file  . Stress: Not on file  Relationships  . Social connections:    Talks on phone: Not on file    Gets together: Not on file    Attends religious service: Not on file    Active member of club or organization: Not on file    Attends meetings of clubs or organizations: Not on file    Relationship status: Not on file  Other Topics Concern  . Not on file  Social History Narrative  . Not on file  Additional Social History:   Sleep: Fair  Appetite:  Fair  Current Medications: Current Facility-Administered Medications  Medication Dose Route Frequency Provider Last Rate Last Dose  . alum & mag hydroxide-simeth (MAALOX/MYLANTA) 200-200-20 MG/5ML suspension 30 mL  30 mL Oral Q4H PRN Clapacs, John T, MD      . ARIPiprazole ER (ABILIFY MAINTENA) injection 400 mg  400 mg Intramuscular Q28 days Pucilowska, Jolanta B, MD   400 mg at 02/21/18 1502  . divalproex (DEPAKOTE) DR tablet 500 mg  500 mg Oral Q12H Pucilowska, Jolanta B, MD   500 mg at 03/01/18 0820  . feeding supplement (ENSURE ENLIVE) (ENSURE ENLIVE) liquid 237 mL  237 mL Oral TID BM Pucilowska, Jolanta B, MD   237 mL at 02/28/18 2047  . magnesium  hydroxide (MILK OF MAGNESIA) suspension 30 mL  30 mL Oral Daily PRN Clapacs, John T, MD      . multivitamin with minerals tablet 1 tablet  1 tablet Oral Daily Pucilowska, Jolanta B, MD   1 tablet at 03/01/18 0819  . QUEtiapine (SEROQUEL) tablet 600 mg  600 mg Oral QHS Pucilowska, Jolanta B, MD   600 mg at 02/28/18 2120  . vitamin B-12 (CYANOCOBALAMIN) tablet 1,000 mcg  1,000 mcg Oral Daily Pucilowska, Jolanta B, MD   1,000 mcg at 03/01/18 0820    Lab Results: No results found for this or any previous visit (from the past 48 hour(s)).  Blood Alcohol level:  Lab Results  Component Value Date   ETH <10 02/11/2018    Metabolic Disorder Labs: Lab Results  Component Value Date   HGBA1C 5.4 02/12/2018   MPG 108 02/12/2018   No results found for: PROLACTIN Lab Results  Component Value Date   CHOL 170 02/12/2018   TRIG 63 02/12/2018   HDL 46 02/12/2018   CHOLHDL 3.7 02/12/2018   VLDL 13 02/12/2018   LDLCALC 111 (H) 02/12/2018    Physical Findings: AIMS: Facial and Oral Movements Muscles of Facial Expression: None, normal Lips and Perioral Area: None, normal Jaw: None, normal Tongue: None, normal,Extremity Movements Upper (arms, wrists, hands, fingers): None, normal Lower (legs, knees, ankles, toes): None, normal, Trunk Movements Neck, shoulders, hips: None, normal, Overall Severity Severity of abnormal movements (highest score from questions above): None, normal Incapacitation due to abnormal movements: None, normal Patient's awareness of abnormal movements (rate only patient's report): No Awareness, Dental Status Current problems with teeth and/or dentures?: No Does patient usually wear dentures?: No  CIWA:  CIWA-Ar Total: 3 COWS:  COWS Total Score: 1  Musculoskeletal: Strength & Muscle Tone: within normal limits Gait & Station: normal Patient leans: N/A  Psychiatric Specialty Exam: Physical Exam  Nursing note and vitals reviewed. Psychiatric: Her speech is normal. Her  affect is blunt. She is withdrawn and actively hallucinating. Thought content is paranoid and delusional. Cognition and memory are impaired. She expresses impulsivity.    Review of Systems  Neurological: Negative.   Psychiatric/Behavioral: Positive for hallucinations.  All other systems reviewed and are negative.   Blood pressure 117/78, pulse (!) 127, temperature 97.8 F (36.6 C), temperature source Oral, resp. rate 18, height 5\' 5"  (1.651 m), weight 55.3 kg (122 lb), last menstrual period 02/10/2018, SpO2 100 %.Body mass index is 20.3 kg/m.  General Appearance: Casual  Eye Contact:  Fair  Speech:  Normal Rate  Volume:  Normal  Mood:  Depressed and Irritable  Affect:  Congruent and Constricted  Thought Process:  Disorganized and Descriptions of Associations: Loose  Orientation:  Full (Time, Place, and Person)  Thought Content:  difficult to assess, pt just insisted that she wants to leave  Suicidal Thoughts:  No  Homicidal Thoughts:  No  Memory:  Immediate;   Fair Recent;   Fair Remote;   Fair  Judgement:  Poor  Insight:  Lacking  Psychomotor Activity:  Normal  Concentration:  Concentration: Fair and Attention Span: Fair  Recall:  Poor  Fund of Knowledge:  Poor  Language:  Fair  Akathisia:  No  Handed:  Right  AIMS (if indicated):     Assets:  Desire for Improvement Physical Health Social Support  ADL's:  Intact  Cognition:  WNL  Sleep:  Number of Hours: 7.25     Treatment Plan Summary: Daily contact with patient to assess and evaluate symptoms and progress in treatment and Medication management   Jenna Bell is a 32 year old female witha history of developmental disability but no mental illnessadmitted for psychotic break.Shewas floridly manic on admission. Agitation has resolved but the patient is still irritable, and has no insight of her mental illness.    #Mood and psychosis,slightly improved  -continueSeroquel to600 mg nightlyfor 2 more  weeks -continueAbilify maintena400 mg injection given on 02/21/18, next dose on 8/16 -continueDepakote to 500 mg BID, VPA level74  #UTI, completed treatment with Keflex  #Substance abuse -positive for cannabis -unable to discuss substance abuse treatment  #Smoking cessation -nicotine patch is available  #B12 defficiency -Vit B12 1000 ug daily  #Weight loss -MVI -Ensure TID  #Labs -lipid panel, TSH and A1Care normal -EKGreviewed, NSR with QTc 423 -head CT scan is negative -pregnancy test negative  #Disposition -homeless -follow upTBE   Ramiro Pangilinan, MD 03/01/2018, 2:19 PM

## 2018-03-02 NOTE — Plan of Care (Signed)
Pt. Compliant with medications this evening. Pt. Monitored for safety by staff. Pt. Denies SI/HI. Pt. Needs reinforcement on provided education. Pt. Continues to present with observable responding to internal stimuli.   Problem: Education: Goal: Knowledge of [place] [facility] Education information/materials will improve Outcome: Not Progressing Goal: Emotional status will improve Outcome: Not Progressing Goal: Mental status will improve Outcome: Not Progressing   Problem: Education: Goal: Will be free of psychotic symptoms Outcome: Not Progressing Goal: Knowledge of the prescribed therapeutic regimen will improve Outcome: Not Progressing   Problem: Safety: Goal: Ability to remain free from injury will improve Outcome: Progressing

## 2018-03-02 NOTE — Progress Notes (Signed)
D: Pt denies SI/HI/AVH. Pt. Verbally is able to contract for safety. Pt. Eye contact during assessments is brief and patient is observed frequently responding to internal stimuli preoccupied. Pt. Forwards little this evening and frequently presents with a flat/blunted expression.   A: Q x 15 minute observation checks were completed for safety. Patient was provided with education.  Patient was given/offered medications per orders. Patient  was encourage to attend groups, participate in unit activities and continue with plan of care. Pt. Chart and plans of care reviewed. Pt. Given support and encouragement.   R: Patient is complaint with medication and unit procedures.             Precautionary checks every 15 minutes for safety maintained, room free of safety hazards, patient sustains no injury or falls during this shift. Will endorse care to next shift.

## 2018-03-02 NOTE — [facility] Group Notes (Signed)
LCSW Group Therapy Note 03/02/2018 1:15pm  Type of Therapy and Topic: Group Therapy: Feelings Around Returning Home & Establishing a Supportive Framework and Supporting Oneself When Supports Not Available  Participation Level: Did Not Attend  Description of Group:  Patients first processed thoughts and feelings about upcoming discharge. These included fears of upcoming changes, lack of change, new living environments, judgements and expectations from others and overall stigma of mental health issues. The group then discussed the definition of a supportive framework, what that looks and feels like, and how do to discern it from an unhealthy non-supportive network. The group identified different types of supports as well as what to do when your family/friends are less than helpful or unavailable  Therapeutic Goals  1. Patient will identify one healthy supportive network that they can use at discharge. 2. Patient will identify one factor of a supportive framework and how to tell it from an unhealthy network. 3. Patient able to identify one coping skill to use when they do not have positive supports from others. 4. Patient will demonstrate ability to communicate their needs through discussion and/or role plays.  Summary of Patient Progress:  Pt was invited to attend group but chose not to attend. CSW will continue to encourage pt to attend group throughout their admission.    Therapeutic Modalities Cognitive Behavioral Therapy Motivational Interviewing   Jenna Bell  CUEBAS-COLON, LCSW 03/02/2018 9:49 AM 

## 2018-03-02 NOTE — Plan of Care (Signed)
Patient is alert, oriented to self, and place. Patient is disoriented to time and situation. Patient remains preoccupied and guarded. Patient is unable to express feelings answering question in one word statement for nurse. Patient continues to have loud ongoing conversations with herself, positive for responding to internal stimuli. Patient comes out of her room to eat meals and attends groups occasionally. Patient is appropriate around peers and staff. Nurse will continue to monitor. Problem: Education: Goal: Knowledge of the prescribed therapeutic regimen will improve Outcome: Progressing   Problem: Coping: Goal: Coping ability will improve Outcome: Progressing Goal: Will verbalize feelings Outcome: Not Progressing   Problem: Health Behavior/Discharge Planning: Goal: Compliance with prescribed medication regimen will improve Outcome: Progressing   Problem: Nutritional: Goal: Ability to achieve adequate nutritional intake will improve Outcome: Progressing   Problem: Role Relationship: Goal: Ability to communicate needs accurately will improve Outcome: Progressing Goal: Ability to interact with others will improve Outcome: Progressing   Problem: Role Relationship: Goal: Ability to interact with others will improve Outcome: Progressing

## 2018-03-02 NOTE — Progress Notes (Signed)
[facility] MD Progress Note  03/02/2018 11:48 AM Artina Minella  MRN:  [id number]  Subjective:   Pt seen and chart reviewed.  Mr. Dowd is a 32yo female with intellectual disability who presented with first episode psychosis, most likely bipolar mania. She has been on Seroquel and Depakote. She received Abilify maintena injection 400mg  on 02/21/18, to increase medication compliance.   Today, she is calmer, and stated that she is feeling well, and the medicines make her eat more. However, she wanted to see her "Primary" referring her PCP because she believes that she is pregnant, which is not true.   Denied SI or HI.   Principal Problem: Bipolar I disorder, current or most recent episode manic, with psychotic features (HCC) Diagnosis:   Patient Active Problem List   Diagnosis Date Noted  . Developmental disability [F89] 02/20/2018  . Tobacco use disorder [F17.200] 02/12/2018  . Cannabis use disorder, moderate, dependence (HCC) [F12.20] 02/12/2018  . UTI (urinary tract infection) [N39.0] 02/12/2018  . Bipolar I disorder, current or most recent episode manic, with psychotic features (HCC) [F31.2] 02/11/2018   Total Time spent with patient: 20 minutes  Past Psychiatric History: intellectual disability  Past Medical History:  Past Medical History:  Diagnosis Date  . Bipolar disorder (HCC)    History reviewed. No pertinent surgical history. Family History: History reviewed. No pertinent family history. Family Psychiatric  History: brother with mental illness Social History:  Social History   Substance and Sexual Activity  Alcohol Use Yes  . Alcohol/week: 1.2 oz  . Types: 2 Glasses of wine per week  . Frequency: Never     Social History   Substance and Sexual Activity  Drug Use Yes  . Types: Methamphetamines    Social History   Socioeconomic History  . Marital status: Single    Spouse name: Not on file  . Number of children: Not on file  . Years of education: Not on file   . Highest education level: Not on file  Occupational History  . Not on file  Social Needs  . Financial resource strain: Not on file  . Food insecurity:    Worry: Not on file    Inability: Not on file  . Transportation needs:    Medical: Not on file    Non-medical: Not on file  Tobacco Use  . Smoking status: Light Tobacco Smoker    Packs/day: 0.25  . Smokeless tobacco: Never Used  Substance and Sexual Activity  . Alcohol use: Yes    Alcohol/week: 1.2 oz    Types: 2 Glasses of wine per week    Frequency: Never  . Drug use: Yes    Types: Methamphetamines  . Sexual activity: Yes  Lifestyle  . Physical activity:    Days per week: Not on file    Minutes per session: Not on file  . Stress: Not on file  Relationships  . Social connections:    Talks on phone: Not on file    Gets together: Not on file    Attends religious service: Not on file    Active member of club or organization: Not on file    Attends meetings of clubs or organizations: Not on file    Relationship status: Not on file  Other Topics Concern  . Not on file  Social History Narrative  . Not on file   Additional Social History:   Sleep: Fair  Appetite:  Fair  Current Medications: Current Facility-Administered Medications  Medication Dose Route  Frequency Provider Last Rate Last Dose  . alum & mag hydroxide-simeth (MAALOX/MYLANTA) 200-200-20 MG/5ML suspension 30 mL  30 mL Oral Q4H PRN Clapacs, John T, MD      . ARIPiprazole ER (ABILIFY MAINTENA) injection 400 mg  400 mg Intramuscular Q28 days Pucilowska, Jolanta B, MD   400 mg at 02/21/18 1502  . divalproex (DEPAKOTE) DR tablet 500 mg  500 mg Oral Q12H Pucilowska, Jolanta B, MD   500 mg at 03/02/18 0839  . feeding supplement (ENSURE ENLIVE) (ENSURE ENLIVE) liquid 237 mL  237 mL Oral TID BM Pucilowska, Jolanta B, MD   237 mL at 03/01/18 2112  . magnesium hydroxide (MILK OF MAGNESIA) suspension 30 mL  30 mL Oral Daily PRN Clapacs, John T, MD      .  multivitamin with minerals tablet 1 tablet  1 tablet Oral Daily Pucilowska, Jolanta B, MD   1 tablet at 03/02/18 0839  . QUEtiapine (SEROQUEL) tablet 600 mg  600 mg Oral QHS Pucilowska, Jolanta B, MD   600 mg at 03/01/18 2111  . vitamin B-12 (CYANOCOBALAMIN) tablet 1,000 mcg  1,000 mcg Oral Daily Pucilowska, Jolanta B, MD   1,000 mcg at 03/02/18 1610    Lab Results: No results found for this or any previous visit (from the past 48 hour(s)).  Blood Alcohol level:  Lab Results  Component Value Date   ETH <10 02/11/2018    Metabolic Disorder Labs: Lab Results  Component Value Date   HGBA1C 5.4 02/12/2018   MPG 108 02/12/2018   No results found for: PROLACTIN Lab Results  Component Value Date   CHOL 170 02/12/2018   TRIG 63 02/12/2018   HDL 46 02/12/2018   CHOLHDL 3.7 02/12/2018   VLDL 13 02/12/2018   LDLCALC 111 (H) 02/12/2018    Physical Findings: AIMS: Facial and Oral Movements Muscles of Facial Expression: None, normal Lips and Perioral Area: None, normal Jaw: None, normal Tongue: None, normal,Extremity Movements Upper (arms, wrists, hands, fingers): None, normal Lower (legs, knees, ankles, toes): None, normal, Trunk Movements Neck, shoulders, hips: None, normal, Overall Severity Severity of abnormal movements (highest score from questions above): None, normal Incapacitation due to abnormal movements: None, normal Patient's awareness of abnormal movements (rate only patient's report): No Awareness, Dental Status Current problems with teeth and/or dentures?: No Does patient usually wear dentures?: No  CIWA:  CIWA-Ar Total: 3 COWS:  COWS Total Score: 1  Musculoskeletal: Strength & Muscle Tone: within normal limits Gait & Station: normal Patient leans: N/A  Psychiatric Specialty Exam: Physical Exam  Nursing note and vitals reviewed. Psychiatric: Her speech is normal. Her affect is blunt. She is withdrawn and actively hallucinating. Thought content is paranoid and  delusional. Cognition and memory are impaired. She expresses impulsivity.    Review of Systems  Neurological: Negative.   Psychiatric/Behavioral: Positive for hallucinations.  All other systems reviewed and are negative.   Blood pressure 121/87, pulse 83, temperature 97.8 F (36.6 C), temperature source Oral, resp. rate 18, height 5\' 5"  (1.651 m), weight 55.3 kg (122 lb), last menstrual period 02/10/2018, SpO2 100 %.Body mass index is 20.3 kg/m.  General Appearance: Casual  Eye Contact:  Good  Speech:  Clear and Coherent and Normal Rate  Volume:  Normal  Mood:  Euthymic  Affect:  Appropriate, Congruent and Full Range  Thought Process:  Disorganized and Descriptions of Associations: Loose  Orientation:  Full (Time, Place, and Person)  Thought Content:  Illogical and Delusions  Suicidal Thoughts:  No  Homicidal Thoughts:  No  Memory:  Immediate;   Fair Recent;   Fair Remote;   Fair  Judgement:  Poor  Insight:  Lacking  Psychomotor Activity:  Normal  Concentration:  Concentration: Fair and Attention Span: Fair  Recall:  Poor  Fund of Knowledge:  Poor  Language:  Fair  Akathisia:  No  Handed:  Right  AIMS (if indicated):     Assets:  Desire for Improvement Physical Health Social Support  ADL's:  Intact  Cognition:  WNL  Sleep:  Number of Hours: 6.45     Treatment Plan Summary: Daily contact with patient to assess and evaluate symptoms and progress in treatment and Medication management   Ms. Oshea is a 32 year old female witha history of developmental disability but no mental illnessadmitted for psychotic break.Shewas floridly manic on admission. Agitation has resolved but the patient is still irritable, and has no insight of her mental illness.    #Mood and psychosis,slightly improved  -continueSeroquel to600 mg nightlyfor 2 more weeks -continueAbilify maintena400 mg injection given on 02/21/18, next dose on 8/16 -continueDepakote to 500 mg BID, VPA  level74  #UTI, completed treatment with Keflex  #Substance abuse -positive for cannabis -unable to discuss substance abuse treatment  #Smoking cessation -nicotine patch is available  #B12 defficiency -Vit B12 1000 ug daily  #Weight loss -MVI -Ensure TID  #Labs -lipid panel, TSH and A1Care normal -EKGreviewed, NSR with QTc 423 -head CT scan is negative -pregnancy test negative.   #Disposition -homeless -follow upTBE   Nicolae Vasek, MD 03/02/2018, 11:48 AM

## 2018-03-03 MED ORDER — HALOPERIDOL 5 MG PO TABS
10.0000 mg | ORAL_TABLET | Freq: Every day | ORAL | Status: DC
  Administered 2018-03-03 – 2018-03-06 (×4): 10 mg via ORAL
  Filled 2018-03-03 (×4): qty 2

## 2018-03-03 MED ORDER — QUETIAPINE FUMARATE 100 MG PO TABS
300.0000 mg | ORAL_TABLET | Freq: Every day | ORAL | Status: DC
  Administered 2018-03-03 – 2018-03-04 (×2): 300 mg via ORAL
  Filled 2018-03-03 (×2): qty 3

## 2018-03-03 NOTE — Progress Notes (Signed)
[facility] MD Progress Note  03/04/2018 2:21 PM Jenna Bell  MRN:  [id number]  Subjective:    Jenna Bell initially told me off this morning but after she was informed that she will not be discharged if she does not have a conversation with me, she did come to my office to talk. She reports no problems and thinks that medications have been helpful. She agrees to continue. She does not have any side effects from medications, including newly started Haldol. She is agreeable to take Haldol decanoate injection. She reports good sleep and appetite. There are no somatic complaints.   She was able to work with Child psychotherapist to complete applications for aftercare programs.   Principal Problem: Bipolar I disorder, current or most recent episode manic, with psychotic features (HCC) Diagnosis:   Patient Active Problem List   Diagnosis Date Noted  . Bipolar I disorder, current or most recent episode manic, with psychotic features (HCC) [F31.2] 02/11/2018    Priority: High  . Developmental disability [F89] 02/20/2018  . Tobacco use disorder [F17.200] 02/12/2018  . Cannabis use disorder, moderate, dependence (HCC) [F12.20] 02/12/2018  . UTI (urinary tract infection) [N39.0] 02/12/2018   Total Time spent with patient: 20 minutes  Past Psychiatric History: intellectual disability  Past Medical History:  Past Medical History:  Diagnosis Date  . Bipolar disorder (HCC)    History reviewed. No pertinent surgical history. Family History: History reviewed. No pertinent family history. Family Psychiatric  History: brither with mental illness Social History:  Social History   Substance and Sexual Activity  Alcohol Use Yes  . Alcohol/week: 1.2 oz  . Types: 2 Glasses of wine per week  . Frequency: Never     Social History   Substance and Sexual Activity  Drug Use Yes  . Types: Methamphetamines    Social History   Socioeconomic History  . Marital status: Single    Spouse name: Not on file   . Number of children: Not on file  . Years of education: Not on file  . Highest education level: Not on file  Occupational History  . Not on file  Social Needs  . Financial resource strain: Not on file  . Food insecurity:    Worry: Not on file    Inability: Not on file  . Transportation needs:    Medical: Not on file    Non-medical: Not on file  Tobacco Use  . Smoking status: Light Tobacco Smoker    Packs/day: 0.25  . Smokeless tobacco: Never Used  Substance and Sexual Activity  . Alcohol use: Yes    Alcohol/week: 1.2 oz    Types: 2 Glasses of wine per week    Frequency: Never  . Drug use: Yes    Types: Methamphetamines  . Sexual activity: Yes  Lifestyle  . Physical activity:    Days per week: Not on file    Minutes per session: Not on file  . Stress: Not on file  Relationships  . Social connections:    Talks on phone: Not on file    Gets together: Not on file    Attends religious service: Not on file    Active member of club or organization: Not on file    Attends meetings of clubs or organizations: Not on file    Relationship status: Not on file  Other Topics Concern  . Not on file  Social History Narrative  . Not on file   Additional Social History:  Sleep: Fair  Appetite:  Fair  Current Medications: Current Facility-Administered Medications  Medication Dose Route Frequency Provider Last Rate Last Dose  . alum & mag hydroxide-simeth (MAALOX/MYLANTA) 200-200-20 MG/5ML suspension 30 mL  30 mL Oral Q4H PRN Clapacs, John T, MD      . ARIPiprazole ER (ABILIFY MAINTENA) injection 400 mg  400 mg Intramuscular Q28 days Fumi Guadron B, MD   400 mg at 02/21/18 1502  . divalproex (DEPAKOTE) DR tablet 500 mg  500 mg Oral Q12H Keaun Schnabel B, MD   500 mg at 03/04/18 0802  . haloperidol (HALDOL) tablet 10 mg  10 mg Oral QHS Wah Sabic B, MD   10 mg at 03/03/18 2129  . magnesium hydroxide (MILK OF MAGNESIA)  suspension 30 mL  30 mL Oral Daily PRN Clapacs, John T, MD      . multivitamin with minerals tablet 1 tablet  1 tablet Oral Daily James Senn B, MD   1 tablet at 03/04/18 0802  . QUEtiapine (SEROQUEL) tablet 300 mg  300 mg Oral QHS Jaquana Geiger B, MD   300 mg at 03/03/18 2117  . vitamin B-12 (CYANOCOBALAMIN) tablet 1,000 mcg  1,000 mcg Oral Daily Hikeem Andersson B, MD   1,000 mcg at 03/04/18 0802    Lab Results: No results found for this or any previous visit (from the past 48 hour(s)).  Blood Alcohol level:  Lab Results  Component Value Date   ETH <10 02/11/2018    Metabolic Disorder Labs: Lab Results  Component Value Date   HGBA1C 5.4 02/12/2018   MPG 108 02/12/2018   No results found for: PROLACTIN Lab Results  Component Value Date   CHOL 170 02/12/2018   TRIG 63 02/12/2018   HDL 46 02/12/2018   CHOLHDL 3.7 02/12/2018   VLDL 13 02/12/2018   LDLCALC 111 (H) 02/12/2018    Physical Findings: AIMS: Facial and Oral Movements Muscles of Facial Expression: None, normal Lips and Perioral Area: None, normal Jaw: None, normal Tongue: None, normal,Extremity Movements Upper (arms, wrists, hands, fingers): None, normal Lower (legs, knees, ankles, toes): None, normal, Trunk Movements Neck, shoulders, hips: None, normal, Overall Severity Severity of abnormal movements (highest score from questions above): None, normal Incapacitation due to abnormal movements: None, normal Patient's awareness of abnormal movements (rate only patient's report): No Awareness, Dental Status Current problems with teeth and/or dentures?: No Does patient usually wear dentures?: No  CIWA:  CIWA-Ar Total: 3 COWS:  COWS Total Score: 1  Musculoskeletal: Strength & Muscle Tone: within normal limits Gait & Station: normal Patient leans: N/A  Psychiatric Specialty Exam: Physical Exam  Nursing note and vitals reviewed. Psychiatric: Her affect is angry, labile and inappropriate. Her  speech is rapid and/or pressured and tangential. She is withdrawn and actively hallucinating. Thought content is paranoid and delusional. Cognition and memory are impaired. She expresses impulsivity.    Review of Systems  Neurological: Negative.   Psychiatric/Behavioral: Positive for hallucinations.  All other systems reviewed and are negative.   Blood pressure 112/83, pulse (!) 105, temperature 97.6 F (36.4 C), temperature source Oral, resp. rate 18, height 5\' 5"  (1.651 m), weight 55.3 kg (122 lb), last menstrual period 02/10/2018, SpO2 99 %.Body mass index is 20.3 kg/m.  General Appearance: Disheveled  Eye Contact:  Good  Speech:  Pressured  Volume:  Increased  Mood:  Angry, Dysphoric and Irritable  Affect:  Congruent, Inappropriate and Labile  Thought Process:  Disorganized, Irrelevant and Descriptions of Associations: Tangential  Orientation:  Full (Time, Place, and Person)  Thought Content:  Illogical, Delusions, Hallucinations: Auditory and Paranoid Ideation  Suicidal Thoughts:  No  Homicidal Thoughts:  No  Memory:  Immediate;   Poor Recent;   Poor Remote;   Poor  Judgement:  Poor  Insight:  Lacking  Psychomotor Activity:  Psychomotor Retardation  Concentration:  Concentration: Poor and Attention Span: Poor  Recall:  Poor  Fund of Knowledge:  Poor  Language:  Poor  Akathisia:  No  Handed:  Right  AIMS (if indicated):     Assets:  Communication Skills Desire for Improvement Physical Health Resilience  ADL's:  Intact  Cognition:  Impaired,  Mild  Sleep:  Number of Hours: 7     Treatment Plan Summary: Daily contact with patient to assess and evaluate symptoms and progress in treatment and Medication management   Jenna Bell is a 32 year old female witha history of developmental disability but no mental illnessadmitted for psychotic break.Shewas floridly manic on admission. Agitation has resolved but the patient is still hallucinating, delusional but less  irritable. She is compliant with treatment. She still has little insight into metal illness.    #Mood and psychosis,slightly improved -lower Seroquel to 300 mg nightly while we transition to Haldol -Haldol 10 mg nightly -continueAbilify maintena400 mg injection given on 02/21/18, next dose on 8/16 -continueDepakote to 500 mg BID, VPA level74 -consider haldol decanoate injection  #UTI, completed treatment with Keflex  #Substance abuse -positive for cannabis -declines substance abuse treatment  #Smoking cessation -nicotine patch is available  #B12 defficiency -Vit B12 1000 ug daily  #Weight loss, resolved -discontinue Ensure  #Labs -lipid panel, TSH and A1Care normal -EKGreviewed, NSR with QTc 423 -head CT scan is negative -pregnancy test negative.  #Disposition -homeless -follow upTBE     Jenna LineaJolanta Amanada Philbrick, MD 03/04/2018, 2:21 PM

## 2018-03-03 NOTE — Plan of Care (Signed)
Patient continues to respond to internal stimuli. Compliant with medications and meals. Remains isolative to her room unless she comes out for medications or meals. Patient became verbally aggressive with doctor  this morning during her assessment. Patient screamed at the MD stating, "Get out of my room!" Denies  SI/HI and pain. Compliant with medications. Milieu remains safe with q 15 minute safety checks.

## 2018-03-03 NOTE — Plan of Care (Signed)
Pt. Compliant with medications this evening. Pt. Monitored by staff for safety per MD orders. Pt. Denies SI/HI and verbally contracts for safety. Pt. Continues to present responding loudly to internal stimuli in her room and while visible around the unit. Pt. Needs reinforcement of provided education.    Problem: Health Behavior/Discharge Planning: Goal: Compliance with prescribed medication regimen will improve Outcome: Progressing   Problem: Safety: Goal: Ability to remain free from injury will improve Outcome: Progressing   Problem: Safety: Goal: Ability to remain free from injury will improve Outcome: Progressing   Problem: Education: Goal: Will be free of psychotic symptoms Outcome: Not Progressing Goal: Knowledge of the prescribed therapeutic regimen will improve Outcome: Not Progressing

## 2018-03-03 NOTE — [facility] Counselor (Signed)
CSW sat down with the patient to discuss her discharge plan. CSW spoke with the patient about another referral for a residential IDD/SA/MH placement setting that would provide her with a more structured enviornment at discharge. Patient agreed and assisted the CSW in filling out the referral. Patient signed a consent for the referral with [employer]. The patient responded appropriately to questions and did not have a moment where she was talking to herself. She did still mention that she was pregnant when asked about having any children. When asked about her medications she says "those are just vitamins that they have me taking". Patient reports that she has always had moments where she talks to herself and reports that when she mentioned it to her PCP awhile back that he told her that it is because of her mental disability and that she will always do that. CSW will continue to work with the patient and assist her in identifying a discharge plan.  Jenna Bell, MSW, Jenna MajorsLCSWA, [place] Clinical Social Worker 03/03/2018 3:50 PM

## 2018-03-03 NOTE — Plan of Care (Signed)
  Problem: Education: Goal: Knowledge of  General Education information/materials will improve Outcome: Progressing Goal: Emotional status will improve Outcome: Progressing Goal: Mental status will improve Outcome: Progressing Goal: Verbalization of understanding the information provided will improve Outcome: Progressing   Problem: Activity: Goal: Will verbalize the importance of balancing activity with adequate rest periods Outcome: Progressing   Problem: Education: Goal: Will be free of psychotic symptoms Outcome: Progressing Goal: Knowledge of the prescribed therapeutic regimen will improve Outcome: Progressing   Problem: Coping: Goal: Coping ability will improve Outcome: Progressing Goal: Will verbalize feelings Outcome: Progressing   Problem: Health Behavior/Discharge Planning: Goal: Compliance with prescribed medication regimen will improve Outcome: Progressing   Problem: Nutritional: Goal: Ability to achieve adequate nutritional intake will improve Outcome: Progressing   Problem: Role Relationship: Goal: Ability to communicate needs accurately will improve Outcome: Progressing Goal: Ability to interact with others will improve Outcome: Progressing   Problem: Safety: Goal: Ability to redirect hostility and anger into socially appropriate behaviors will improve Outcome: Progressing Goal: Ability to remain free from injury will improve Outcome: Progressing   Problem: Self-Care: Goal: Ability to participate in self-care as condition permits will improve Outcome: Progressing   Problem: Self-Concept: Goal: Will verbalize positive feelings about self Outcome: Progressing   Problem: Safety: Goal: Ability to remain free from injury will improve Outcome: Progressing

## 2018-03-03 NOTE — BHH Counselor (Signed)
CSW spoke with [facility]amil [facility]. He reports having a person that is leaving on Wednesday 03/03/2018 and will have the bed open on Friday 03/07/2018 for the patient. CSW will  inform the patient and treatment team and create a solid discharge plan for the patient.   Johny Shearsassandra Puja Caffey, MSW, Theresia MajorsLCSWA, Bridget HartshornLCASA Clinical Social Worker 03/03/2018 11:42 AM

## 2018-03-03 NOTE — Progress Notes (Signed)
D: Pt denies SI/HI/AVH. Pt is pleasant and cooperative, but continues to present responding to internal stimuli. Pt. has  No Complaints.  Patient Interaction appropriate, but forwards little and is minimal. Pt. Observed frequently around the unit preoccupied responding.   A: Q x 15 minute observation checks were completed for safety. Patient was provided with education, but needs reinforcement.  Patient was given/offered medications per orders. Patient  was encourage to attend groups, participate in unit activities and continue with plan of care. Pt. Chart and plans of care reviewed. Pt. Given support and encouragement.   R: Patient is complaint with medication and unit procedures.             Precautionary checks every 15 minutes for safety maintained, room free of safety hazards, patient sustains no injury or falls during this shift. Will endorse care to next shift.

## 2018-03-03 NOTE — Tx Team (Signed)
Interdisciplinary Treatment and Diagnostic Plan Update  03/03/2018 Time of Session: 10:30am Jenna Bell MRN: [id number]  Principal Diagnosis: Bipolar I disorder, current or most recent episode manic, with psychotic features (HCC)  Secondary Diagnoses: Principal Problem:   Bipolar I disorder, current or most recent episode manic, with psychotic features (HCC) Active Problems:   Tobacco use disorder   Cannabis use disorder, moderate, dependence (HCC)   UTI (urinary tract infection)   Developmental disability   Current Medications:  Current Facility-Administered Medications  Medication Dose Route Frequency Provider Last Rate Last Dose  . alum & mag hydroxide-simeth (MAALOX/MYLANTA) 200-200-20 MG/5ML suspension 30 mL  30 mL Oral Q4H PRN Clapacs, John T, MD      . ARIPiprazole ER (ABILIFY MAINTENA) injection 400 mg  400 mg Intramuscular Q28 days Pucilowska, Jolanta B, MD   400 mg at 02/21/18 1502  . divalproex (DEPAKOTE) DR tablet 500 mg  500 mg Oral Q12H Pucilowska, Jolanta B, MD   500 mg at 03/03/18 0807  . feeding supplement (ENSURE ENLIVE) (ENSURE ENLIVE) liquid 237 mL  237 mL Oral TID BM Pucilowska, Jolanta B, MD   237 mL at 03/03/18 1021  . magnesium hydroxide (MILK OF MAGNESIA) suspension 30 mL  30 mL Oral Daily PRN Clapacs, John T, MD      . multivitamin with minerals tablet 1 tablet  1 tablet Oral Daily Pucilowska, Jolanta B, MD   1 tablet at 03/03/18 0806  . QUEtiapine (SEROQUEL) tablet 600 mg  600 mg Oral QHS Pucilowska, Jolanta B, MD   600 mg at 03/02/18 2116  . vitamin B-12 (CYANOCOBALAMIN) tablet 1,000 mcg  1,000 mcg Oral Daily Pucilowska, Jolanta B, MD   1,000 mcg at 03/03/18 0807   PTA Medications: No medications prior to admission.    Patient Stressors: Medication change or noncompliance Substance abuse  Patient Strengths: Motivation for treatment/growth Supportive family/friends  Treatment Modalities: Medication Management, Group therapy, Case management,   1 to 1 session with clinician, Psychoeducation, Recreational therapy.   Physician Treatment Plan for Primary Diagnosis: Bipolar I disorder, current or most recent episode manic, with psychotic features (HCC) Long Term Goal(s): Improvement in symptoms so as ready for discharge Improvement in symptoms so as ready for discharge   Short Term Goals: Ability to identify changes in lifestyle to reduce recurrence of condition will improve Ability to verbalize feelings will improve Ability to disclose and discuss suicidal ideas Ability to demonstrate self-control will improve Ability to identify and develop effective coping behaviors will improve Ability to maintain clinical measurements within normal limits will improve Compliance with prescribed medications will improve Ability to identify changes in lifestyle to reduce recurrence of condition will improve Ability to demonstrate self-control will improve Ability to identify triggers associated with substance abuse/mental health issues will improve  Medication Management: Evaluate patient's response, side effects, and tolerance of medication regimen.  Therapeutic Interventions: 1 to 1 sessions, Unit Group sessions and Medication administration.  Evaluation of Outcomes: Progressing  Physician Treatment Plan for Secondary Diagnosis: Principal Problem:   Bipolar I disorder, current or most recent episode manic, with psychotic features (HCC) Active Problems:   Tobacco use disorder   Cannabis use disorder, moderate, dependence (HCC)   UTI (urinary tract infection)   Developmental disability  Long Term Goal(s): Improvement in symptoms so as ready for discharge Improvement in symptoms so as ready for discharge   Short Term Goals: Ability to identify changes in lifestyle to reduce recurrence of condition will improve Ability to verbalize feelings will  improve Ability to disclose and discuss suicidal ideas Ability to demonstrate self-control  will improve Ability to identify and develop effective coping behaviors will improve Ability to maintain clinical measurements within normal limits will improve Compliance with prescribed medications will improve Ability to identify changes in lifestyle to reduce recurrence of condition will improve Ability to demonstrate self-control will improve Ability to identify triggers associated with substance abuse/mental health issues will improve     Medication Management: Evaluate patient's response, side effects, and tolerance of medication regimen.  Therapeutic Interventions: 1 to 1 sessions, Unit Group sessions and Medication administration.  Evaluation of Outcomes: Progressing   RN Treatment Plan for Primary Diagnosis: Bipolar I disorder, current or most recent episode manic, with psychotic features (HCC) Long Term Goal(s): Knowledge of disease and therapeutic regimen to maintain health will improve  Short Term Goals: Ability to participate in decision making will improve, Ability to verbalize feelings will improve, Ability to identify and develop effective coping behaviors will improve and Compliance with prescribed medications will improve  Medication Management: RN will administer medications as ordered by provider, will assess and evaluate patient's response and provide education to patient for prescribed medication. RN will report any adverse and/or side effects to prescribing provider.  Therapeutic Interventions: 1 on 1 counseling sessions, Psychoeducation, Medication administration, Evaluate responses to treatment, Monitor vital signs and CBGs as ordered, Perform/monitor CIWA, COWS, AIMS and Fall Risk screenings as ordered, Perform wound care treatments as ordered.  Evaluation of Outcomes: Progressing   LCSW Treatment Plan for Primary Diagnosis: Bipolar I disorder, current or most recent episode manic, with psychotic features (HCC) Long Term Goal(s): Safe transition to appropriate  next level of care at discharge, Engage patient in therapeutic group addressing interpersonal concerns.  Short Term Goals: Engage patient in aftercare planning with referrals and resources, Increase social support, Facilitate acceptance of mental health diagnosis and concerns, Identify triggers associated with mental health/substance abuse issues and Increase skills for wellness and recovery  Therapeutic Interventions: Assess for all discharge needs, 1 to 1 time with Social worker, Explore available resources and support systems, Assess for adequacy in community support network, Educate family and significant other(s) on suicide prevention, Complete Psychosocial Assessment, Interpersonal group therapy.  Evaluation of Outcomes: Progressing   Progress in Treatment: Attending groups: Yes. Participating in groups: Yes. Taking medication as prescribed: Yes. Toleration medication: Yes. Family/Significant other contact made: Yes, individual(s) contacted:  Patients mother Patient understands diagnosis: Yes. Discussing patient identified problems/goals with staff: Yes. Medical problems stabilized or resolved: Yes. Denies suicidal/homicidal ideation: Yes. Issues/concerns per patient self-inventory: No. Other:   New problem(s) identified: No, Describe:  None  New Short Term/Long Term Goal(s): No goal  Patient Goals:  No goal  Discharge Plan or Barriers: To go to a shelter and follow up with outpatient treatment.  Reason for Continuation of Hospitalization: Medication stabilization  Estimated Length of Stay: 4 days  Recreational Therapy: Patient Stressors: N/A Patient Goal: Patient will engage in interactions with peers and staff in pro-social manner at least 2x within 5 recreation therapy group sessions  Attendees: Patient:  03/03/2018 11:18 AM  Physician: Dr. Jennet MaduroPucilowska, MD 03/03/2018 11:18 AM  Nursing: Bruce Donathyawn Chisem, RN 03/03/2018 11:18 AM  RN Care Manager: 03/03/2018 11:18 AM  Social  Worker: Johny Shearsassandra Basil Buffin, LCSWA 03/03/2018 11:18 AM  Recreational Therapist: Danella DeisShay. Outlaw CTRS, LRT 03/03/2018 11:18 AM  Other: Jake SharkSara Laws, LCSW 03/03/2018 11:18 AM  Other:  03/03/2018 11:18 AM  Other:  03/03/2018 11:18 AM    Scribe for Treatment  Team: [name], LCSW 03/03/2018 11:18 AM
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
## 2018-03-03 NOTE — Progress Notes (Signed)
Recreation Therapy Notes  Date: 03/03/2018  Time: 9:30 am  Location: Craft Room  Behavioral response: Appropriate    Intervention Topic: Self-esteem  Discussion/Intervention:  Group content today was focused on self-esteem. Patient defined self-esteem and where it comes form. The group described reasons self-esteem is important. Individuals stated things that impact self-esteem and positive ways to improve self-esteem. The group participated in the intervention "Collage of Me" where patients were able to create a collage of positive things that makes them who they are. Clinical Observations/Feedback:  Patient came to group and appeared to be responding to internal stimuli. Individual participated in the intervention.  Delainie Chavana LRT/CTRS         Annora Guderian 03/03/2018 12:22 PM

## 2018-03-04 NOTE — Plan of Care (Signed)
Pt out in the milieu with her peers this evening. Pt denies SI/HI. Pt compliant with medications. Pt is receptive to treatment and safety maintained on unit. Will continue to monitor. Problem: Education: Goal: Knowledge of [facility] Education information/materials will improve Outcome: Progressing Goal: Emotional status will improve Outcome: Progressing Goal: Mental status will improve Outcome: Progressing Goal: Verbalization of understanding the information provided will improve Outcome: Progressing   Problem: Education: Goal: Will be free of psychotic symptoms Outcome: Progressing   Problem: Coping: Goal: Coping ability will improve Outcome: Progressing Goal: Will verbalize feelings Outcome: Progressing

## 2018-03-04 NOTE — BHH Group Notes (Signed)
03/04/2018 1PM  Type of Therapy/Topic:  Group Therapy:  Feelings about Diagnosis  Participation Level:  Did Not Attend   Description of Group:   This group will allow patients to explore their thoughts and feelings about diagnoses they have received. Patients will be guided to explore their level of understanding and acceptance of these diagnoses. Facilitator will encourage patients to process their thoughts and feelings about the reactions of others to their diagnosis and will guide patients in identifying ways to discuss their diagnosis with significant others in their lives. This group will be process-oriented, with patients participating in exploration of their own experiences, giving and receiving support, and processing challenge from other group members.   Therapeutic Goals: 1. Patient will demonstrate understanding of diagnosis as evidenced by identifying two or more symptoms of the disorder 2. Patient will be able to express two feelings regarding the diagnosis 3. Patient will demonstrate their ability to communicate their needs through discussion and/or role play  Summary of Patient Progress: Patient was encouraged and invited to attend group. Patient did not attend group. Social worker will continue to encourage group participation in the future.        Therapeutic Modalities:   Cognitive Behavioral Therapy Brief Therapy Feelings Identification    Johny ShearsCassandra  Delmas Faucett, LCSW 03/04/2018 4:30 PM

## 2018-03-04 NOTE — Progress Notes (Signed)
Recreation Therapy Notes          Jenna Bell 03/04/2018 2:35 PM

## 2018-03-04 NOTE — Plan of Care (Signed)
Patient's mood is somewhat improving. Continues to respond to internal stimuli. Compliant with medications and meals. Up in the milieu periodically in the dayroom with minimal interaction with peers but rather talking to someone that is not visible to anyone. Denies any thoughts of self harm. Attended group this morning. Milieu remains safe with q 15 minute safety checks. Will continue to monitor.

## 2018-03-05 MED ORDER — DIPHENHYDRAMINE HCL 25 MG PO CAPS
50.0000 mg | ORAL_CAPSULE | Freq: Every evening | ORAL | Status: DC | PRN
  Administered 2018-03-05: 50 mg via ORAL
  Filled 2018-03-05: qty 2

## 2018-03-05 MED ORDER — QUETIAPINE FUMARATE 100 MG PO TABS
100.0000 mg | ORAL_TABLET | Freq: Every day | ORAL | Status: DC
  Administered 2018-03-05: 100 mg via ORAL
  Filled 2018-03-05: qty 1

## 2018-03-05 NOTE — Progress Notes (Signed)
[facility] MD Progress Note  03/05/2018 3:22 PM Izabel Chim  MRN:  [id number]  Subjective:   Ms. Behunin is pleasant and conversational this morning. She reports feeling better and assured me that medications are helpful. She still actively hallucinates and we worry that she will not be able to survive in the community without support while talking to herself.  Spoke with her mother who can not offer any real assistance but would allow the patient to use her address for correspondence.    Principal Problem: Bipolar I disorder, current or most recent episode manic, with psychotic features (HCC) Diagnosis:   Patient Active Problem List   Diagnosis Date Noted  . Bipolar I disorder, current or most recent episode manic, with psychotic features (HCC) [F31.2] 02/11/2018    Priority: High  . Developmental disability [F89] 02/20/2018  . Tobacco use disorder [F17.200] 02/12/2018  . Cannabis use disorder, moderate, dependence (HCC) [F12.20] 02/12/2018  . UTI (urinary tract infection) [N39.0] 02/12/2018   Total Time spent with patient: 20 minutes  Past Psychiatric History: psychosis  Past Medical History:  Past Medical History:  Diagnosis Date  . Bipolar disorder (HCC)    History reviewed. No pertinent surgical history. Family History: History reviewed. No pertinent family history. Family Psychiatric  History: brother with mental illness Social History:  Social History   Substance and Sexual Activity  Alcohol Use Yes  . Alcohol/week: 1.2 oz  . Types: 2 Glasses of wine per week  . Frequency: Never     Social History   Substance and Sexual Activity  Drug Use Yes  . Types: Methamphetamines    Social History   Socioeconomic History  . Marital status: Single    Spouse name: Not on file  . Number of children: Not on file  . Years of education: Not on file  . Highest education level: Not on file  Occupational History  . Not on file  Social Needs  . Financial resource strain:  Not on file  . Food insecurity:    Worry: Not on file    Inability: Not on file  . Transportation needs:    Medical: Not on file    Non-medical: Not on file  Tobacco Use  . Smoking status: Light Tobacco Smoker    Packs/day: 0.25  . Smokeless tobacco: Never Used  Substance and Sexual Activity  . Alcohol use: Yes    Alcohol/week: 1.2 oz    Types: 2 Glasses of wine per week    Frequency: Never  . Drug use: Yes    Types: Methamphetamines  . Sexual activity: Yes  Lifestyle  . Physical activity:    Days per week: Not on file    Minutes per session: Not on file  . Stress: Not on file  Relationships  . Social connections:    Talks on phone: Not on file    Gets together: Not on file    Attends religious service: Not on file    Active member of club or organization: Not on file    Attends meetings of clubs or organizations: Not on file    Relationship status: Not on file  Other Topics Concern  . Not on file  Social History Narrative  . Not on file   Additional Social History:                         Sleep: Fair  Appetite:  Fair  Current Medications: Current Facility-Administered Medications  Medication  Dose Route Frequency Provider Last Rate Last Dose  . alum & mag hydroxide-simeth (MAALOX/MYLANTA) 200-200-20 MG/5ML suspension 30 mL  30 mL Oral Q4H PRN Clapacs, John T, MD      . ARIPiprazole ER (ABILIFY MAINTENA) injection 400 mg  400 mg Intramuscular Q28 days Rashidah Belleville B, MD   400 mg at 02/21/18 1502  . diphenhydrAMINE (BENADRYL) capsule 50 mg  50 mg Oral QHS PRN Clapacs, Jackquline DenmarkJohn T, MD   50 mg at 03/05/18 0057  . divalproex (DEPAKOTE) DR tablet 500 mg  500 mg Oral Q12H Jianni Shelden B, MD   500 mg at 03/05/18 0834  . haloperidol (HALDOL) tablet 10 mg  10 mg Oral QHS Betzaida Cremeens B, MD   10 mg at 03/04/18 2218  . magnesium hydroxide (MILK OF MAGNESIA) suspension 30 mL  30 mL Oral Daily PRN Clapacs, John T, MD      . multivitamin with minerals  tablet 1 tablet  1 tablet Oral Daily Bianco Cange B, MD   1 tablet at 03/05/18 0834  . QUEtiapine (SEROQUEL) tablet 100 mg  100 mg Oral QHS Ilai Hiller B, MD      . vitamin B-12 (CYANOCOBALAMIN) tablet 1,000 mcg  1,000 mcg Oral Daily Melba Araki B, MD   1,000 mcg at 03/05/18 [id number]    Lab Results: No results found for this or any previous visit (from the past 48 hour(s)).  Blood Alcohol level:  Lab Results  Component Value Date   ETH <10 02/11/2018    Metabolic Disorder Labs: Lab Results  Component Value Date   HGBA1C 5.4 02/12/2018   MPG 108 02/12/2018   No results found for: PROLACTIN Lab Results  Component Value Date   CHOL 170 02/12/2018   TRIG 63 02/12/2018   HDL 46 02/12/2018   CHOLHDL 3.7 02/12/2018   VLDL 13 02/12/2018   LDLCALC 111 (H) 02/12/2018    Physical Findings: AIMS: Facial and Oral Movements Muscles of Facial Expression: None, normal Lips and Perioral Area: None, normal Jaw: None, normal Tongue: None, normal,Extremity Movements Upper (arms, wrists, hands, fingers): None, normal Lower (legs, knees, ankles, toes): None, normal, Trunk Movements Neck, shoulders, hips: None, normal, Overall Severity Severity of abnormal movements (highest score from questions above): None, normal Incapacitation due to abnormal movements: None, normal Patient's awareness of abnormal movements (rate only patient's report): No Awareness, Dental Status Current problems with teeth and/or dentures?: No Does patient usually wear dentures?: No  CIWA:  CIWA-Ar Total: 3 COWS:  COWS Total Score: 1  Musculoskeletal: Strength & Muscle Tone: within normal limits Gait & Station: normal Patient leans: N/A  Psychiatric Specialty Exam: Physical Exam  Nursing note and vitals reviewed. Psychiatric: Her speech is normal. Thought content normal. Her mood appears anxious. She is actively hallucinating. Cognition and memory are impaired. She expresses impulsivity.     Review of Systems  Neurological: Negative.   Psychiatric/Behavioral: Positive for hallucinations.  All other systems reviewed and are negative.   Blood pressure 115/76, pulse 94, temperature 98.2 F (36.8 C), temperature source Oral, resp. rate 16, height 5\' 5"  (1.651 m), weight 55.3 kg (122 lb), last menstrual period 02/10/2018, SpO2 100 %.Body mass index is 20.3 kg/m.  General Appearance: Casual  Eye Contact:  Good  Speech:  Clear and Coherent  Volume:  Normal  Mood:  Anxious  Affect:  Flat  Thought Process:  Goal Directed and Descriptions of Associations: Intact  Orientation:  Full (Time, Place, and Person)  Thought Content:  Hallucinations:  Auditory  Suicidal Thoughts:  No  Homicidal Thoughts:  No  Memory:  Immediate;   Fair Recent;   Fair Remote;   Fair  Judgement:  Poor  Insight:  Lacking  Psychomotor Activity:  Decreased  Concentration:  Concentration: Fair and Attention Span: Fair  Recall:  [employer] of Knowledge:  Fair  Language:  Fair  Akathisia:  No  Handed:  Right  AIMS (if indicated):     Assets:  Communication Skills Desire for Improvement Physical Health Resilience  ADL's:  Intact  Cognition:  WNL  Sleep:  Number of Hours: 5     Treatment Plan Summary: Daily contact with patient to assess and evaluate symptoms and progress in treatment and Medication management   Ms. Knipfer is a 32 year old female witha history of developmental disability but no mental illnessadmitted for psychotic break.Shewas floridly manic on admission. Agitation has resolved but the patient is stillhallucinating, delusional but less irritable. She is compliant with treatment. She still has little insight into metal illness.    #Mood and psychosis,slightly improved -lower Seroquel to 150 mg nightly while we transition to Haldol -Haldol 10 mg nightly -continueAbilify maintena400 mg injection given on 02/21/18, next dose on 8/16 -continueDepakote to 500 mg BID, VPA  level74 -consider haldol decanoate injection tomorrow  #UTI, completed treatment with Keflex  #Substance abuse -positive for cannabis -declines substance abuse treatment  #Smoking cessation -nicotine patch is available  #B12 defficiency -Vit B12 1000 ug daily  #Weight loss, resolved -discontinue Ensure  #Labs -lipid panel, TSH and A1Care normal -EKGreviewed, NSR with QTc 423 -head CT scan is negative -pregnancy test negative.  #Disposition -homeless -[facility] -encourage disability application -[facility]fortunately [facility] requires $15 daily fee     Kristine Linea, MD 03/05/2018, 3:22 PM

## 2018-03-05 NOTE — Progress Notes (Signed)
Recreation Therapy Notes   Date: 03/05/2018  Time: 9:30 pm   Location: Craft Room   Behavioral response: N/A   Intervention Topic: Stress  Discussion/Intervention: Patient did not attend group.   Clinical Observations/Feedback:  Patient did not attend group.   Sherlie Boyum LRT/CTRS        Coyle Stordahl 03/05/2018 12:08 PM 

## 2018-03-05 NOTE — Progress Notes (Signed)
[facility] MD Progress Note  03/06/2018 3:47 PM Jenna Bell  MRN:  [id number]  Subjective:   Jenna Bell has no complaints. She denies any symptoms but still attends to internal stimuli. She is not suicidal or homicidal. She tolerates medications well. She will be discharged tomorrow to [facility] place. [employer] kindly provided funding.  Principal Problem: Bipolar I disorder, current or most recent episode manic, with psychotic features (HCC) Diagnosis:   Patient Active Problem List   Diagnosis Date Noted  . Bipolar I disorder, current or most recent episode manic, with psychotic features (HCC) [F31.2] 02/11/2018    Priority: High  . Developmental disability [F89] 02/20/2018  . Tobacco use disorder [F17.200] 02/12/2018  . Cannabis use disorder, moderate, dependence (HCC) [F12.20] 02/12/2018  . UTI (urinary tract infection) [N39.0] 02/12/2018   Total Time spent with patient: 20 minutes  Past Psychiatric History: intellectual disability.  Past Medical History:  Past Medical History:  Diagnosis Date  . Bipolar disorder (HCC)    History reviewed. No pertinent surgical history. Family History: History reviewed. No pertinent family history. Family Psychiatric  History: brother with mental illness. Social History:  Social History   Substance and Sexual Activity  Alcohol Use Yes  . Alcohol/week: 1.2 oz  . Types: 2 Glasses of wine per week  . Frequency: Never     Social History   Substance and Sexual Activity  Drug Use Yes  . Types: Methamphetamines    Social History   Socioeconomic History  . Marital status: Single    Spouse name: Not on file  . Number of children: Not on file  . Years of education: Not on file  . Highest education level: Not on file  Occupational History  . Not on file  Social Needs  . Financial resource strain: Not on file  . Food insecurity:    Worry: Not on file    Inability: Not on file  . Transportation needs:    Medical: Not on  file    Non-medical: Not on file  Tobacco Use  . Smoking status: Light Tobacco Smoker    Packs/day: 0.25  . Smokeless tobacco: Never Used  Substance and Sexual Activity  . Alcohol use: Yes    Alcohol/week: 1.2 oz    Types: 2 Glasses of wine per week    Frequency: Never  . Drug use: Yes    Types: Methamphetamines  . Sexual activity: Yes  Lifestyle  . Physical activity:    Days per week: Not on file    Minutes per session: Not on file  . Stress: Not on file  Relationships  . Social connections:    Talks on phone: Not on file    Gets together: Not on file    Attends religious service: Not on file    Active member of club or organization: Not on file    Attends meetings of clubs or organizations: Not on file    Relationship status: Not on file  Other Topics Concern  . Not on file  Social History Narrative  . Not on file   Additional Social History:                         Sleep: Fair  Appetite:  Fair  Current Medications: Current Facility-Administered Medications  Medication Dose Route Frequency Provider Last Rate Last Dose  . alum & mag hydroxide-simeth (MAALOX/MYLANTA) 200-200-20 MG/5ML suspension 30 mL  30 mL Oral Q4H PRN Clapacs, John T,  MD      . ARIPiprazole ER (ABILIFY MAINTENA) injection 400 mg  400 mg Intramuscular Q28 days Karrisa Didio B, MD   400 mg at 02/21/18 1502  . diphenhydrAMINE (BENADRYL) capsule 50 mg  50 mg Oral QHS PRN Clapacs, Jackquline DenmarkJohn T, MD   50 mg at 03/05/18 0057  . divalproex (DEPAKOTE) DR tablet 500 mg  500 mg Oral Q12H Riku Buttery B, MD   500 mg at 03/06/18 0753  . haloperidol (HALDOL) tablet 10 mg  10 mg Oral QHS Ariz Terrones B, MD   10 mg at 03/05/18 2139  . haloperidol decanoate (HALDOL DECANOATE) 100 MG/ML injection 100 mg  100 mg Intramuscular Q28 days Terril Amaro B, MD      . magnesium hydroxide (MILK OF MAGNESIA) suspension 30 mL  30 mL Oral Daily PRN Clapacs, John T, MD      . multivitamin with  minerals tablet 1 tablet  1 tablet Oral Daily Avaya Mcjunkins B, MD   1 tablet at 03/06/18 0752  . QUEtiapine (SEROQUEL) tablet 100 mg  100 mg Oral QHS Pama Roskos B, MD   100 mg at 03/05/18 2139  . vitamin B-12 (CYANOCOBALAMIN) tablet 1,000 mcg  1,000 mcg Oral Daily Valeria Boza B, MD   1,000 mcg at 03/06/18 0753    Lab Results: No results found for this or any previous visit (from the past 48 hour(s)).  Blood Alcohol level:  Lab Results  Component Value Date   ETH <10 02/11/2018    Metabolic Disorder Labs: Lab Results  Component Value Date   HGBA1C 5.4 02/12/2018   MPG 108 02/12/2018   No results found for: PROLACTIN Lab Results  Component Value Date   CHOL 170 02/12/2018   TRIG 63 02/12/2018   HDL 46 02/12/2018   CHOLHDL 3.7 02/12/2018   VLDL 13 02/12/2018   LDLCALC 111 (H) 02/12/2018    Physical Findings: AIMS: Facial and Oral Movements Muscles of Facial Expression: None, normal Lips and Perioral Area: None, normal Jaw: None, normal Tongue: None, normal,Extremity Movements Upper (arms, wrists, hands, fingers): None, normal Lower (legs, knees, ankles, toes): None, normal, Trunk Movements Neck, shoulders, hips: None, normal, Overall Severity Severity of abnormal movements (highest score from questions above): None, normal Incapacitation due to abnormal movements: None, normal Patient's awareness of abnormal movements (rate only patient's report): No Awareness, Dental Status Current problems with teeth and/or dentures?: No Does patient usually wear dentures?: No  CIWA:  CIWA-Ar Total: 3 COWS:  COWS Total Score: 1  Musculoskeletal: Strength & Muscle Tone: within normal limits Gait & Station: normal Patient leans: N/A  Psychiatric Specialty Exam: Physical Exam  Nursing note and vitals reviewed. Psychiatric: She has a normal mood and affect. Her speech is normal. Judgment normal. She is actively hallucinating. Thought content is delusional.  Cognition and memory are normal.    Review of Systems  Neurological: Negative.   Psychiatric/Behavioral: Positive for hallucinations.  All other systems reviewed and are negative.   Blood pressure 104/68, pulse (!) 104, temperature 98.1 F (36.7 C), temperature source Oral, resp. rate 18, height 5\' 5"  (1.651 m), weight 55.3 kg (122 lb), last menstrual period 02/10/2018, SpO2 100 %.Body mass index is 20.3 kg/m.  General Appearance: Casual  Eye Contact:  Good  Speech:  Clear and Coherent  Volume:  Normal  Mood:  Euthymic  Affect:  Appropriate  Thought Process:  Goal Directed and Descriptions of Associations: Intact  Orientation:  Full (Time, Place, and Person)  Thought  Content:  Hallucinations: Auditory  Suicidal Thoughts:  No  Homicidal Thoughts:  No  Memory:  Immediate;   Fair Recent;   Fair Remote;   Fair  Judgement:  Impaired  Insight:  Present  Psychomotor Activity:  Normal  Concentration:  Concentration: Fair and Attention Span: Fair  Recall:  [employer] of Knowledge:  Fair  Language:  Fair  Akathisia:  No  Handed:  Right  AIMS (if indicated):     Assets:  Communication Skills Desire for Improvement Housing Physical Health Resilience Social Support  ADL's:  Intact  Cognition:  WNL  Sleep:  Number of Hours: 6.3     Treatment Plan Summary: Daily contact with patient to assess and evaluate symptoms and progress in treatment and Medication management   Ms. Eyman is a 32 year old female witha history of developmental disability but no mental illnessadmitted for psychotic break.Shewas floridly manic on admission. Agitation has resolved but the patient is stillhallucinating, delusionalbut less irritable. She is compliant with treatment. Symptoms mostly resolved except for residual hallucinations.  #Mood and psychosis,improved -discontinue Seroquel  -Haldol 10 mg nightly -continueAbilify maintena400 mg injections, next dose on 8/16 -continue Haldol  decanoate 100 mg monthly injections, next dose on 04/04/2018 -continueDepakote to 500 mg BID, VPA level74  #Substance abuse -positive for cannabis -declines substance abuse treatment  #Smoking cessation -nicotine patch is available  #B12 defficiency -Vit B12 1000 ug daily  #Weight loss, resolved -discontinue Ensure  #Labs -lipid panel, TSH and A1Care normal -EKGreviewed, NSR with QTc 423 -head CT scan is negative -pregnancy test negative.  #Disposition -discharge to [facility] place -follow up with [facility]       Kristine Linea, MD 03/06/2018, 3:47 PM

## 2018-03-05 NOTE — Progress Notes (Signed)
Received Jenna Bell this AM after breakfast, she returned to her room and sleeping. She was awaken for her medications and was compliant. She denied all of the psychiatric symptoms. She has been quiet throughout the day and OOB for her meals.

## 2018-03-05 NOTE — Plan of Care (Signed)
Pt in dayroom with peers. Pt denies SI/HI. Pt is receptive to treatment and safety maintained on unit. Will continue to monitor.  Problem: Education: Goal: Emotional status will improve Outcome: Progressing Goal: Mental status will improve Outcome: Progressing Goal: Verbalization of understanding the information provided will improve Outcome: Progressing   Problem: Education: Goal: Will be free of psychotic symptoms Outcome: Progressing Goal: Knowledge of the prescribed therapeutic regimen will improve Outcome: Progressing   Problem: Health Behavior/Discharge Planning: Goal: Compliance with prescribed medication regimen will improve Outcome: Progressing   Problem: Nutritional: Goal: Ability to achieve adequate nutritional intake will improve Outcome: Progressing   Problem: Role Relationship: Goal: Ability to interact with others will improve Outcome: Progressing   Problem: Safety: Goal: Ability to redirect hostility and anger into socially appropriate behaviors will improve Outcome: Progressing   Problem: Self-Care: Goal: Ability to participate in self-care as condition permits will improve Outcome: Progressing   Problem: Self-Concept: Goal: Will verbalize positive feelings about self Outcome: Progressing

## 2018-03-06 MED ORDER — ARIPIPRAZOLE ER 400 MG IM SRER: 400.0000 mg | INTRAMUSCULAR | 1 refills | Status: DC

## 2018-03-06 MED ORDER — ADULT MULTIVITAMIN W/MINERALS CH: 1.0000 | ORAL_TABLET | Freq: Every day | ORAL | 1 refills | Status: DC

## 2018-03-06 MED ORDER — HALOPERIDOL DECANOATE 100 MG/ML IM SOLN
100.0000 mg | INTRAMUSCULAR | Status: DC
  Administered 2018-03-06: 100 mg via INTRAMUSCULAR
  Filled 2018-03-06: qty 1

## 2018-03-06 MED ORDER — CYANOCOBALAMIN 1000 MCG PO TABS: 1000.0000 ug | ORAL_TABLET | Freq: Every day | ORAL | 1 refills | Status: DC

## 2018-03-06 MED ORDER — HALOPERIDOL 10 MG PO TABS: 10.0000 mg | ORAL_TABLET | Freq: Every day | ORAL | 1 refills | Status: DC

## 2018-03-06 MED ORDER — DIVALPROEX SODIUM 500 MG PO DR TAB: 500.0000 mg | DELAYED_RELEASE_TABLET | Freq: Two times a day (BID) | ORAL | 1 refills | Status: DC

## 2018-03-06 MED ORDER — HALOPERIDOL DECANOATE 100 MG/ML IM SOLN: 100.0000 mg | INTRAMUSCULAR | 1 refills | Status: DC

## 2018-03-06 NOTE — Progress Notes (Signed)
Recreation Therapy Notes   Date: 03/06/2018  Time: 9:30 am  Location: Craft Room  Behavioral response: Appropriate    Intervention Topic: Self-care  Discussion/Intervention:  Group content today was focused on Self-Care. The group defined self-care and some positive ways they care for themselves. Individuals expressed ways and reasons why they neglected any self-care in the past. Patients described ways to improve self-care in the future. The group explained what could happen if they did not do any self-care activities at all. The group participated in the intervention "self-care assessment" where they had a chance to discover some of their weaknesses and strengths in self- care. Patient came up with a self-care plan to improve themselves in the future.  Clinical Observations/Feedback:  Patient came to group and defined self-care as taking care of your-self. She participated in the intervention and was social with peers during group. Cielle Aguila LRT/CTRS          Diamantina Edinger 03/06/2018 11:16 AM

## 2018-03-06 NOTE — [facility] Group Notes (Signed)
LCSW Group Therapy Note  03/06/2018 1:00pm  Type of Therapy/Topic:  Group Therapy:  Balance in Life  Participation Level:  Minimal  Description of Group:    This group will address the concept of balance and how it feels and looks when one is unbalanced. Patients will be encouraged to process areas in their lives that are out of balance and identify reasons for remaining unbalanced. Facilitators will guide patients in utilizing problem-solving interventions to address and correct the stressor making their life unbalanced. Understanding and applying boundaries will be explored and addressed for obtaining and maintaining a balanced life. Patients will be encouraged to explore ways to assertively make their unbalanced needs known to significant others in their lives, using other group members and facilitator for support and feedback.  Therapeutic Goals: 1. Patient will identify two or more emotions or situations they have that consume much of in their lives. 2. Patient will identify signs/triggers that life has become out of balance:  3. Patient will identify two ways to set boundaries in order to achieve balance in their lives:  4. Patient will demonstrate ability to communicate their needs through discussion and/or role plays  Summary of Patient Progress:  Jenna Bell participated some in today's group discussion on balance in life.  Jenna Bell shared that having her 1st baby and having to live with her mother have been two situations that have recently consumed her life.  Jenna Bell struggled to actively participate in today's group due to obviouse internal stimuli affecting her. CSW observed her "talking to herself" throughout most of the group.    Therapeutic Modalities:   Cognitive Behavioral Therapy Solution-Focused Therapy Assertiveness Training  Alease FrameSonya S Maahi Lannan, KentuckyLCSW 03/06/2018 4:33 PM

## 2018-03-06 NOTE — [facility] Group Notes (Signed)
[facility] Group Notes:  (Nursing/MHT/Case Management/Adjunct)  Date:  03/06/2018  Time:  12:31 AM  Type of Therapy:  Group Therapy  Participation Level:  Active  Participation Quality:  Appropriate and She told [name] when he was going off in Group to just listen to what I was saying.  Affect:  Talking to self.  Cognitive:  Alert  Insight:  Good  Engagement in Group:  Engaged  Modes of Intervention:  Support  Summary of Progress/Problems:  Jenna Bell 03/06/2018, 12:31 AM

## 2018-03-06 NOTE — [facility] Group Notes (Signed)
LCSW Group Therapy Note 03/06/2018 9:00 AM  Type of Therapy and Topic:  Group Therapy:  Setting Goals  Participation Level:  Minimal  Description of Group: In this process group, patients discussed using strengths to work toward goals and address challenges.  Patients identified two positive things about themselves and one goal they were working on.  Patients were given the opportunity to share openly and support each other's plan for self-empowerment.  The group discussed the value of gratitude and were encouraged to have a daily reflection of positive characteristics or circumstances.  Patients were encouraged to identify a plan to utilize their strengths to work on current challenges and goals.  Therapeutic Goals 1. Patient will verbalize personal strengths/positive qualities and relate how these can assist with achieving desired personal goals 2. Patients will verbalize affirmation of peers plans for personal change and goal setting 3. Patients will explore the value of gratitude and positive focus as related to successful achievement of goals 4. Patients will verbalize a plan for regular reinforcement of personal positive qualities and circumstances.  Summary of Patient Progress: Jenna Bell was able to engage some in today's group discussion on setting goals using the SMART Model.  Jenna Bell shared that she has a good understanding of how she can using the SMART Model to help her establish her own life goals.  Jenna Bell shared that one goal that she would like to do, which is a long term goal, is to write a book.  Jenna Bell shared that she have started writing while in the [facility].      Therapeutic Modalities Cognitive Behavioral Therapy Motivational Interviewing    Alease FrameSonya S Ercia Crisafulli, Alexander MtLCSW 03/06/2018 12:51 PM

## 2018-03-06 NOTE — Progress Notes (Signed)
   03/06/18 0920  Clinical Encounter Type  Visited With Patient  Visit Type Initial;Spiritual support;Behavioral Health  Referral From Physician  Consult/Referral To Chaplain  Spiritual Encounters  Spiritual Needs Other (Comment)   CH received a request from the patient's behavioral health physician to interact and assess the patient. I requested assistance from the NT (Reggie) who showed me to the patient's room. The patient was not in her room like she normally would be. Realizing that it was time for the 0930 group, I located Jenna Bell in the craft room. Jenna Bell appeared to be very content and was completing a survey for the group leader. As I observed the patient she had no interaction with the other patients nor the group leader. I will make a second attempt to talk with [name] after lunch.

## 2018-03-06 NOTE — Progress Notes (Signed)
Received Jenna Bell this AM in the dining room eating breakfast, she was compliant with her medications although she forgot to come to the medication room. She was polite, making eye contact and answered the assessment questions. She continued to deny hearing voices although she has been assessed responding to internal stimuli. She received her haldol decanoate injection this PM.

## 2018-03-06 NOTE — BHH Suicide Risk Assessment (Signed)
[facility]   Principal Problem: Schizoaffective disorder, bipolar type [facility](HCC) Discharge Diagnoses:  Patient Active Problem List   Diagnosis Date Noted  . Schizoaffective disorder, bipolar type (HCC) [F25.0] 02/11/2018    Priority: High  . Developmental disability [F89] 02/20/2018  . Tobacco use disorder [F17.200] 02/12/2018  . Cannabis use disorder, moderate, dependence (HCC) [F12.20] 02/12/2018  . UTI (urinary tract infection) [N39.0] 02/12/2018    Total Time spent with patient: 20 minutes  Musculoskeletal: Strength & Muscle Tone: within normal limits Gait & Station: normal Patient leans: N/A  Psychiatric Specialty Exam: Review of Systems  Neurological: Negative.   Psychiatric/Behavioral: Positive for hallucinations.  All other systems reviewed and are negative.   Blood pressure 116/77, pulse (!) 112, temperature 98.6 F (37 C), temperature source Oral, resp. rate 18, height 5\' 5"  (1.651 m), weight 55.3 kg (122 lb), last menstrual period 02/10/2018, SpO2 100 %.Body mass index is 20.3 kg/m.  General Appearance: Casual  Eye Contact::  Good  Speech:  Clear and Coherent409  Volume:  Normal  Mood:  Euthymic  Affect:  Appropriate  Thought Process:  Goal Directed and Descriptions of Associations: Intact  Orientation:  Full (Time, Place, and Person)  Thought Content:  Hallucinations: Auditory  Suicidal Thoughts:  No  Homicidal Thoughts:  No  Memory:  Immediate;   Fair Recent;   Fair Remote;   Fair  Judgement:  Impaired  Insight:  Shallow  Psychomotor Activity:  Normal  Concentration:  Fair  Recall:  [employer]Fair  Fund of Knowledge:Fair  Language: Fair  Akathisia:  No  Handed:  Right  AIMS (if indicated):     Assets:  Communication Skills Desire for Improvement Housing Physical Health Resilience Social Support  Sleep:  Number of Hours: 7.45  Cognition: WNL  ADL's:  Intact   Mental Status Per Nursing Assessment::   On Admission:   NA  Demographic Factors:  Adolescent or young adult, Low socioeconomic status and Unemployed  Loss Factors: Decrease in vocational status, Loss of significant relationship and Financial problems/change in socioeconomic status  Historical Factors: Impulsivity  Risk Reduction Factors:   Sense of responsibility to family and Positive social support  Continued Clinical Symptoms:  Schizophrenia:   Less than 573 years old  Cognitive Features That Contribute To Risk:  None    Suicide Risk:  Minimal: No identifiable suicidal ideation.  Patients presenting with no risk factors but with morbid ruminations; may be classified as minimal risk based on the severity of the depressive symptoms  Follow-up Information    [facility] Follow up.   Why:  You will have a bed placement here at discharge. Thank you. Contact information: [address] [place], [place] [address] [phone]       [facility].   Why:  Please follow up for your appointment on Wedneday March 12, 2018 at 2:30PM. Thank you. Contact information: [address] [place] [address] [phone]           Plan Of Care/Follow-up recommendations:  Activity:  as tolerated Diet:  regular Other:  keep follow up appointments  Kristine LineaJolanta Dawaun Brancato, MD 03/07/2018, 9:40 AM

## 2018-03-06 NOTE — Discharge Summary (Signed)
Physician Discharge Summary Note  Patient:  Jenna Bell is an 32 y.o., female MRN:  [id number] DOB:  Jan 04, 1986 Patient phone:  [phone] (home)  Patient address:   [address] [place] [address],  Total Time spent with patient: 20 minutes plus 15 min on care coordination and documentation  Date of Admission:  02/12/2018 Date of Discharge: 03/07/2018  Reason for Admission:  Psychotic break.  History of Present Illness:   Identifying data. Jenna Bell is a 32 year old female with unknown psychiatric history.  Chief complaint. "I am fine, there is nothing wrong with me."  History of present illness. Information was obtained from the patient and the chart. The patient was brouht to the ER by police on petition from her family for bizarre behavior. The patient lives in [place] but has been visiting her family in [place] for a few days. She has been hallucinating, easily agitated and unable to care for herself. She has not been eating, lost a lot of weight and started losing hair. She has been insomniac and paranoid. She has been aggressive towards her family. She has been yelling loudly in front of her mother's house, in the ER and on the unit.  The patient herself is hardly able to provide information. Denies any symptoms of depression, anxiety or psychosis and demands immediate discharge. Her speech is extremely difficult to understand. She is poorly groomed, irritable and impatient.   Past psychitaric history. Unknwn. Apparently the patient did seek treatment in the past but nevel followed up. The family believes that the patient has not been taking her medications. The patient denies ever having problems or being hospitalized.  Family psychiatric history. Unknown.  Social history. Reportedly, the patient lives with a friend in [place] but intended to move in with her mother on Monday. She hopes to be able to return to her mother's place. She tells  me that she has a job at [employer].  Principal Problem: Schizoaffective disorder, bipolar type [facility]) Discharge Diagnoses: Patient Active Problem List   Diagnosis Date Noted  . Schizoaffective disorder, bipolar type (HCC) [F25.0] 02/11/2018    Priority: High  . Developmental disability [F89] 02/20/2018  . Tobacco use disorder [F17.200] 02/12/2018  . Cannabis use disorder, moderate, dependence (HCC) [F12.20] 02/12/2018  . UTI (urinary tract infection) [N39.0] 02/12/2018    Past Medical History:  Past Medical History:  Diagnosis Date  . Bipolar disorder (HCC)    History reviewed. No pertinent surgical history. Family History: History reviewed. No pertinent family history.  Social History:  Social History   Substance and Sexual Activity  Alcohol Use Yes  . Alcohol/week: 1.2 oz  . Types: 2 Glasses of wine per week  . Frequency: Never     Social History   Substance and Sexual Activity  Drug Use Yes  . Types: Methamphetamines    Social History   Socioeconomic History  . Marital status: Single    Spouse name: Not on file  . Number of children: Not on file  . Years of education: Not on file  . Highest education level: Not on file  Occupational History  . Not on file  Social Needs  . Financial resource strain: Not on file  . Food insecurity:    Worry: Not on file    Inability: Not on file  . Transportation needs:    Medical: Not on file    Non-medical: Not on file  Tobacco Use  . Smoking status: Light Tobacco Smoker  Packs/day: 0.25  . Smokeless tobacco: Never Used  Substance and Sexual Activity  . Alcohol use: Yes    Alcohol/week: 1.2 oz    Types: 2 Glasses of wine per week    Frequency: Never  . Drug use: Yes    Types: Methamphetamines  . Sexual activity: Yes  Lifestyle  . Physical activity:    Days per week: Not on file    Minutes per session: Not on file  . Stress: Not on file  Relationships  . Social connections:    Talks on phone: Not on  file    Gets together: Not on file    Attends religious service: Not on file    Active member of club or organization: Not on file    Attends meetings of clubs or organizations: Not on file    Relationship status: Not on file  Other Topics Concern  . Not on file  Social History Narrative  . Not on file    Hospital Course:    Jenna Bell is a 32 year old female witha history of mild intellectual disability but no mental illnessadmitted for psychotic break.Shewas floridly manic on admission. All symptoms resolved with treatment except for residual hallucinations. Her recovery was protracted. Mood component subsided with Depakote treatment. Psychotic symptoms did not fully respond to Seroquel or Abilify ut improved with addition of Haldol. She was discharged on Abilify maintena and Haldol decanoate along with Depakote.  #Mood and psychosis,improved -continueAbilify maintena400 mg injections, next dose on 8/16 -continue Haldol decanoate 100 mg monthly injections, next dose on 04/04/2018 -continueDepakote to 500 mg BID, VPA level74 -oral Haldol 10 mg nightly as necessary  #Substance abuse -positive for cannabis -declines substance abuse treatment  #Smoking cessation -nicotine patch is available  #B12 defficiency -Vit B12 1000 ug daily  #Labs -lipid panel, TSH and A1Care normal -EKGreviewed, NSR with QTc 423 -head CT scan is negative -pregnancy test negative  #Disposition -discharge to [facility] place -follow up with [facility]    Physical Findings: AIMS: Facial and Oral Movements Muscles of Facial Expression: None, normal Lips and Perioral Area: None, normal Jaw: None, normal Tongue: None, normal,Extremity Movements Upper (arms, wrists, hands, fingers): None, normal Lower (legs, knees, ankles, toes): None, normal, Trunk Movements Neck, shoulders, hips: None, normal, Overall Severity Severity of abnormal movements (highest score from questions above): None,  normal Incapacitation due to abnormal movements: None, normal Patient's awareness of abnormal movements (rate only patient's report): No Awareness, Dental Status Current problems with teeth and/or dentures?: No Does patient usually wear dentures?: No  CIWA:  CIWA-Ar Total: 3 COWS:  COWS Total Score: 1  Musculoskeletal: Strength & Muscle Tone: within normal limits Gait & Station: normal Patient leans: N/A  Psychiatric Specialty Exam: Physical Exam  Nursing note and vitals reviewed. Psychiatric: She has a normal mood and affect. Her speech is normal. Thought content normal. She is actively hallucinating. Cognition and memory are normal. She expresses impulsivity.    Review of Systems  Neurological: Negative.   Psychiatric/Behavioral: Positive for hallucinations.  All other systems reviewed and are negative.   Blood pressure 116/77, pulse (!) 112, temperature 98.6 F (37 C), temperature source Oral, resp. rate 18, height 5\' 5"  (1.651 m), weight 55.3 kg (122 lb), last menstrual period 02/10/2018, SpO2 100 %.Body mass index is 20.3 kg/m.  General Appearance: Casual  Eye Contact:  Good  Speech:  Clear and Coherent  Volume:  Normal  Mood:  Euthymic  Affect:  Appropriate  Thought Process:  Goal Directed and  Descriptions of Associations: Intact  Orientation:  Full (Time, Place, and Person)  Thought Content:  Hallucinations: Auditory  Suicidal Thoughts:  No  Homicidal Thoughts:  No  Memory:  Immediate;   Fair Recent;   Fair Remote;   Fair  Judgement:  Impaired  Insight:  Shallow  Psychomotor Activity:  Normal  Concentration:  Concentration: Fair and Attention Span: Fair  Recall:  [employer] of Knowledge:  Fair  Language:  Fair  Akathisia:  No  Handed:  Right  AIMS (if indicated):     Assets:  Communication Skills Desire for Improvement Housing Physical Health Resilience Social Support  ADL's:  Intact  Cognition:  WNL  Sleep:  Number of Hours: 7.45     Have you used  any form of tobacco in the last 30 days? (Cigarettes, Smokeless Tobacco, Cigars, and/or Pipes): Yes  Has this patient used any form of tobacco in the last 30 days? (Cigarettes, Smokeless Tobacco, Cigars, and/or Pipes) Yes, Yes, A prescription for an FDA-approved tobacco cessation medication was offered at discharge and the patient refused  Blood Alcohol level:  Lab Results  Component Value Date   ETH <10 02/11/2018    Metabolic Disorder Labs:  Lab Results  Component Value Date   HGBA1C 5.4 02/12/2018   MPG 108 02/12/2018   No results found for: PROLACTIN Lab Results  Component Value Date   CHOL 170 02/12/2018   TRIG 63 02/12/2018   HDL 46 02/12/2018   CHOLHDL 3.7 02/12/2018   VLDL 13 02/12/2018   LDLCALC 111 (H) 02/12/2018    See Psychiatric Specialty Exam and Suicide Risk Assessment completed by Attending Physician prior to discharge.  Discharge destination:  Home  Is patient on multiple antipsychotic therapies at discharge:  Yes,   Do you recommend tapering to monotherapy for antipsychotics?  No   Has Patient had three or more failed trials of antipsychotic monotherapy by history:  Yes,   Antipsychotic medications that previously failed include:   1.  seroquel., 2.  haldol. and 3.  seroquel.  Recommended Plan for Multiple Antipsychotic Therapies: Additional reason(s) for multiple antispychotic treatment:  inadequate response to a single agent  Discharge Instructions    Diet - low sodium heart healthy   Complete by:  As directed    Increase activity slowly   Complete by:  As directed      Allergies as of 03/07/2018   No Known Allergies     Medication List    TAKE these medications     Indication  ARIPiprazole ER 400 MG Srer injection Commonly known as:  ABILIFY MAINTENA Inject 2 mLs (400 mg total) into the muscle every 28 (twenty-eight) days. Next injection on 03/21/2018 Start taking on:  03/21/2018  Indication:  Schizophrenia   cyanocobalamin 1000 MCG  tablet Take 1 tablet (1,000 mcg total) by mouth daily.  Indication:  Inadequate Vitamin B12   divalproex 500 MG DR tablet Commonly known as:  DEPAKOTE Take 1 tablet (500 mg total) by mouth every 12 (twelve) hours.  Indication:  Schizophrenia   haloperidol 10 MG tablet Commonly known as:  HALDOL Take 1 tablet (10 mg total) by mouth at bedtime.  Indication:  Schizophrenia   haloperidol decanoate 100 MG/ML injection Commonly known as:  HALDOL DECANOATE Inject 1 mL (100 mg total) into the muscle every 28 (twenty-eight) days. Next injection on 04/04/2018  Indication:  Schizophrenia   multivitamin with minerals Tabs tablet Take 1 tablet by mouth daily.  Indication:  general health  Follow-up Information    [facility] Follow up.   Why:  You will have a bed placement here at discharge. Thank you. Contact information: [address] [place], [place] [address] [phone] and [phone]       [facility]2018.   Why:  Please follow up for your appointment on Wedneday March 12, 2018 at 2:30PM. Thank you. Contact information: [address] [place] [address] [phone]           Follow-up recommendations:  Activity:  as tolerated Diet:  regular Other:  keep follow up appointments  Comments:     Signed: Kristine LineaJolanta Lun Muro, MD 03/07/2018, 9:40 AM

## 2018-03-07 NOTE — Progress Notes (Signed)
D: Patient denies SI/HI/AVH at this time. Pt appears calm and cooperative, and no distress noted.  A: All Personal items in locker returned to pt. Pt escorted out of the building by this Clinical research associatewriter.  R:  Pt States she will comply with outpatient services, and take MEDS as prescribed. Pt. States she will comply with treatment plan put forward by herself and treatment team. Pt. Given extensive discharge education and pt. Was given 7 days supply of prescriptions per orders.

## 2018-03-07 NOTE — Progress Notes (Signed)
Nour was visible in the milieu until bedtime. Attended HS group and remained cooperative. Pleasant and compliant with unit expectations. Had a snack and presented to the medication room. Received HS medications and had no concern. Denying thoughts of self harm. Denying hallucinations. Currently in bed resting and staff continue to monitor.

## 2018-03-07 NOTE — Plan of Care (Signed)
Active in the milieu. Compliant with treatment 

## 2018-03-07 NOTE — Progress Notes (Signed)
  [facility] :  Will you be returning to the same living situation after discharge:  Yes,  [name] by the [employer] At discharge, do you have transportation home?: Yes,  [facility] Do you have the ability to pay for your medications: Yes,  [name];  [name]   Why:  You will have a bed placement here at discharge. Thank you. Contact information: [address] [place], [place] [address] [phone] and [phone]       [name]/12/2018.   Why:  Please follow up for your appointment on Wedneday March 12, 2018 at 2:30PM. Thank you. Contact information: [address] [place] [address] [phone]           [name] has [facility] to [facility] Link:no  Safety Planning and Suicide Prevention discussed: Yes,  Completed with patient  Have you used any form of tobacco in the last 30 days? (Cigarettes, Smokeless Tobacco, Cigars, and/or Pipes): Yes  Has patient been referred to the Quitline?: Patient refused referral  Patient has been referred for addiction treatment: Yes  Johny ShearsCassandra  Cyndie Woodbeck, LCSW 03/07/2018, 9:28 AM

## 2018-03-07 NOTE — BHH Group Notes (Signed)
03/07/2018 1PM  Type of Therapy and Topic:  Group Therapy:  Feelings around Relapse and Recovery  Participation Level:  Active   Description of Group:    Patients in this group will discuss emotions they experience before and after a relapse. They will process how experiencing these feelings, or avoidance of experiencing them, relates to having a relapse. Facilitator will guide patients to explore emotions they have related to recovery. Patients will be encouraged to process which emotions are more powerful. They will be guided to discuss the emotional reaction significant others in their lives may have to patients' relapse or recovery. Patients will be assisted in exploring ways to respond to the emotions of others without this contributing to a relapse.  Therapeutic Goals: 1. Patient will identify two or more emotions that lead to a relapse for them 2. Patient will identify two emotions that result when they relapse 3. Patient will identify two emotions related to recovery 4. Patient will demonstrate ability to communicate their needs through discussion and/or role plays   Summary of Patient Progress: Glee ArvinLatoya came outside and participated in the conversation about self-care and recovery. She states that one thing that she will implement to work towards recovery is "setting goals and talking to my therapist" Patient is still in the process of reaching her goals.    Therapeutic Modalities:   Cognitive Behavioral Therapy Solution-Focused Therapy Assertiveness Training Relapse Prevention Therapy   Johny ShearsCassandra  Waymon Laser, LCSW 03/07/2018 2:25 PM

## 2018-03-10 ENCOUNTER — Emergency Department
Admission: EM | Admit: 2018-03-10 | Discharge: 2018-03-10 | Disposition: A | Discharge status: Home / Self Care | Payer: Medicaid Other | Attending: Emergency Medicine | Admitting: Emergency Medicine

## 2018-03-10 ENCOUNTER — Encounter: Payer: Self-pay | Admitting: Intensive Care

## 2018-03-10 ENCOUNTER — Other Ambulatory Visit: Payer: Self-pay

## 2018-03-10 DIAGNOSIS — T887XXA Unspecified adverse effect of drug or medicament, initial encounter: Secondary | ICD-10-CM | POA: Diagnosis present

## 2018-03-10 DIAGNOSIS — Y69 Unspecified misadventure during surgical and medical care: Secondary | ICD-10-CM | POA: Diagnosis not present

## 2018-03-10 DIAGNOSIS — F17210 Nicotine dependence, cigarettes, uncomplicated: Secondary | ICD-10-CM | POA: Diagnosis not present

## 2018-03-10 DIAGNOSIS — R531 Weakness: Secondary | ICD-10-CM

## 2018-03-10 LAB — COMPREHENSIVE METABOLIC PANEL
ALT: 19 U/L (ref 0–44)
ANION GAP: 9 (ref 5–15)
AST: 30 U/L (ref 15–41)
Albumin: 3.7 g/dL (ref 3.5–5.0)
Alkaline Phosphatase: 58 U/L (ref 38–126)
BILIRUBIN TOTAL: 0.5 mg/dL (ref 0.3–1.2)
BUN: 17 mg/dL (ref 6–20)
CO2: 25 mmol/L (ref 22–32)
Calcium: 9.3 mg/dL (ref 8.9–10.3)
Chloride: 104 mmol/L (ref 98–111)
Creatinine, Ser: 0.61 mg/dL (ref 0.44–1.00)
GFR calc Af Amer: >60 mL/min (ref >60)
GFR calc non Af Amer: >60 mL/min (ref >60)
GLUCOSE: 105 mg/dL — AB (ref 70–99)
POTASSIUM: 4.1 mmol/L (ref 3.5–5.1)
Sodium: 138 mmol/L (ref 135–145)
TOTAL PROTEIN: 8.3 g/dL — AB (ref 6.5–8.1)

## 2018-03-10 LAB — URINALYSIS, COMPLETE (UACMP) WITH MICROSCOPIC
BACTERIA UA: NONE SEEN
BILIRUBIN URINE: NEGATIVE
Glucose, UA: NEGATIVE mg/dL
HGB URINE DIPSTICK: NEGATIVE
KETONES UR: NEGATIVE mg/dL
LEUKOCYTES UA: NEGATIVE
NITRITE: NEGATIVE
PROTEIN: NEGATIVE mg/dL
SPECIFIC GRAVITY, URINE: 1.017 (ref 1.005–1.030)
pH: 7 (ref 5.0–8.0)

## 2018-03-10 LAB — CBC WITH DIFFERENTIAL/PLATELET
BASOS ABS: 0.1 10*3/uL (ref 0–0.1)
BLASTS: 0 %
Band Neutrophils: 0 %
Basophils Relative: 1 %
Eosinophils Absolute: 0 10*3/uL (ref 0–0.7)
Eosinophils Relative: 0 %
HCT: 33.7 % — ABNORMAL LOW (ref 35.0–47.0)
HEMOGLOBIN: 11.3 g/dL — AB (ref 12.0–16.0)
LYMPHS ABS: 1.5 10*3/uL (ref 1.0–3.6)
Lymphocytes Relative: 20 %
MCH: 27.4 pg (ref 26.0–34.0)
MCHC: 33.5 g/dL (ref 32.0–36.0)
MCV: 81.9 fL (ref 80.0–100.0)
METAMYELOCYTES PCT: 0 %
MYELOCYTES: 0 %
Monocytes Absolute: 0.4 10*3/uL (ref 0.2–0.9)
Monocytes Relative: 6 %
NEUTROS PCT: 73 %
NRBC: 0 /100{WBCs}
Neutro Abs: 5.4 10*3/uL (ref 1.4–6.5)
Other: 0 %
PROMYELOCYTES RELATIVE: 0 %
Platelets: 278 10*3/uL (ref 150–440)
RBC: 4.12 MIL/uL (ref 3.80–5.20)
RDW: 14 % (ref 11.5–14.5)
WBC: 7.4 10*3/uL (ref 3.6–11.0)

## 2018-03-10 LAB — POCT PREGNANCY, URINE: Preg Test, Ur: NEGATIVE

## 2018-03-10 LAB — VALPROIC ACID LEVEL: VALPROIC ACID LVL: 15 ug/mL — AB (ref 50.0–100.0)

## 2018-03-10 MED ORDER — HALOPERIDOL 10 MG PO TABS: 5.0000 mg | ORAL_TABLET | Freq: Every day | ORAL | 1 refills | Status: DC

## 2018-03-10 NOTE — ED Triage Notes (Addendum)
See first nurse note. Patient states "I am sluggish ever since I woke up my mouth feels watery. I do not feel like myself ever since I took haldol. I took haldol last night at 10" Denies pain. Patient arrived by EMS. Denies HI/SI Patient was just discharged from behavioral unit 03/07/18. Patient told this RN she was pregnant but pregnancy test showed negative

## 2018-03-10 NOTE — ED Notes (Signed)
.  FIRST NURSE NOTE: Pt arrived via [facility] from home possible group home. Pt was d/c here on 2nd. Pt had haldol last night and is feeling sluggish this morning. Pt hx of schizoaffective and developmental disorder. Pt is in wheel chair in front of officer at this time.

## 2018-03-10 NOTE — ED Notes (Signed)
Valproic Acid collected via right antecubital per this RN; sent to lab at this time.

## 2018-03-10 NOTE — Discharge Instructions (Signed)
Take your Depakote at 8 AM and 8 PM, take 5 mg only of the Haldol at night at bedtime.  You can break the 10 mg tablets in half.

## 2018-03-10 NOTE — ED Provider Notes (Signed)
[facility] Emergency Department Provider Note       Time seen: ----------------------------------------- 2:24 PM on 03/10/2018 -----------------------------------------   I have reviewed the triage vital signs and the nursing notes.  HISTORY   Chief Complaint Weakness    HPI Jenna Bell is a 32 y.o. female with a history of bipolar disorder and developmental disability who presents to the ED for general ill feeling after starting medications.  Patient was recently admitted into the hospital with behavioral health for bizarre behavior and possible hallucinations.  She has been taking Haldol and Depakote since discharge.  Patient states she feels sluggish ever since she woke up this morning and her mouth feels watery.  She states she does not feel like herself ever since she took Haldol last night.  She is taken 10 mg of Haldol at night and Depakote 500 twice a day.  Past Medical History:  Diagnosis Date  . Bipolar disorder [facility](HCC)     Patient Active Problem List   Diagnosis Date Noted  . Developmental disability 02/20/2018  . Tobacco use disorder 02/12/2018  . Cannabis use disorder, moderate, dependence (HCC) 02/12/2018  . UTI (urinary tract infection) 02/12/2018  . Schizoaffective disorder, bipolar type (HCC) 02/11/2018    History reviewed. No pertinent surgical history.  Allergies Patient has no known allergies.  Social History Social History   Tobacco Use  . Smoking status: Current Every Day Smoker    Packs/day: 0.25    Types: Cigarettes  . Smokeless tobacco: Never Used  Substance Use Topics  . Alcohol use: Yes    Alcohol/week: 1.2 oz    Types: 2 Glasses of wine per week    Frequency: Never  . Drug use: Yes    Types: Methamphetamines   Review of Systems Constitutional: Negative for fever. Cardiovascular: Negative for chest pain. Respiratory: Negative for shortness of breath. Gastrointestinal: Negative for abdominal pain,  vomiting and diarrhea. Musculoskeletal: Negative for back pain. Skin: Negative for rash. Neurological: Positive for weakness  All systems negative/normal/unremarkable except as stated in the HPI  ____________________________________________   PHYSICAL EXAM:  VITAL SIGNS: ED Triage Vitals  Enc Vitals Group     BP 03/10/18 1241 115/72     Pulse Rate 03/10/18 1241 99     Resp 03/10/18 1241 16     Temp 03/10/18 1241 99 F (37.2 C)     Temp Source 03/10/18 1241 Oral     SpO2 03/10/18 1241 99 %     Weight 03/10/18 1242 130 lb (59 kg)     Height 03/10/18 1242 5\' 7"  (1.702 m)     Head Circumference --      Peak Flow --      Pain Score 03/10/18 1252 0     Pain Loc --      Pain Edu? --      Excl. in GC? --    Constitutional: Alert and oriented.  Lethargic, no distress Eyes: Conjunctivae are normal. Normal extraocular movements. ENT   Head: Normocephalic and atraumatic.   Nose: No congestion/rhinnorhea.   Mouth/Throat: Mucous membranes are moist.   Neck: No stridor. Cardiovascular: Normal rate, regular rhythm. No murmurs, rubs, or gallops. Respiratory: Normal respiratory effort without tachypnea nor retractions. Breath sounds are clear and equal bilaterally. No wheezes/rales/rhonchi. Gastrointestinal: Soft and nontender. Normal bowel sounds Musculoskeletal: Nontender with normal range of motion in extremities. No lower extremity tenderness nor edema. Neurologic:  Normal speech and language. No gross focal neurologic deficits are appreciated.  Parkinsonian  symptoms are present including masklike face, tremor and bradykinesia Skin:  Skin is warm, dry and intact. No rash noted. Psychiatric: Flat affect ____________________________________________  EKG: Interpreted by me.  Sinus tachycardia with a rate of 110 bpm, normal PR interval, normal QRS, normal QT, nonspecific T wave changes  ____________________________________________  ED COURSE:  As part of my medical  decision making, I reviewed the following data within the electronic MEDICAL RECORD NUMBER History obtained from family if available, nursing notes, old chart and ekg, as well as notes from prior ED visits. Patient presented for general ill feeling and likely medication side effect, we will assess with labs as indicated at this time.   Procedures ____________________________________________   LABS (pertinent positives/negatives)  Labs Reviewed  URINALYSIS, COMPLETE (UACMP) WITH MICROSCOPIC - Abnormal; Notable for the following components:      Result Value   Color, Urine YELLOW (*)    APPearance HAZY (*)    All other components within normal limits  CBC WITH DIFFERENTIAL/PLATELET - Abnormal; Notable for the following components:   Hemoglobin 11.3 (*)    HCT 33.7 (*)    All other components within normal limits  COMPREHENSIVE METABOLIC PANEL - Abnormal; Notable for the following components:   Glucose, Bld 105 (*)    Total Protein 8.3 (*)    All other components within normal limits  POCT PREGNANCY, URINE  POC URINE PREG, ED   ____________________________________________  DIFFERENTIAL DIAGNOSIS   Medication side effect, dehydration, electrolyte abnormality, occult infection  FINAL ASSESSMENT AND PLAN  Medication intolerance, pseudoparkinsonism   Plan: The patient had presented for weakness after recently starting doses of Haldol and Depakote. Patient's labs were reassuring.  I will decrease her dose of each of these medications and advise close outpatient follow-up with her doctor.   Ulice Dash, MD   Note: This note was generated in part or whole with voice recognition software. Voice recognition is usually quite accurate but there are transcription errors that can and very often do occur. I apologize for any typographical errors that were not detected and corrected.     Emily Filbert, MD 03/10/18 [phone]

## 2018-03-16 ENCOUNTER — Emergency Department
Admission: EM | Admit: 2018-03-16 | Discharge: 2018-03-16 | Disposition: A | Discharge status: Home / Self Care | Payer: Medicaid Other | Attending: Emergency Medicine | Admitting: Emergency Medicine

## 2018-03-16 ENCOUNTER — Emergency Department: Payer: Medicaid Other

## 2018-03-16 ENCOUNTER — Other Ambulatory Visit: Payer: Self-pay

## 2018-03-16 DIAGNOSIS — T50901A Poisoning by unspecified drugs, medicaments and biological substances, accidental (unintentional), initial encounter: Secondary | ICD-10-CM | POA: Insufficient documentation

## 2018-03-16 DIAGNOSIS — F89 Unspecified disorder of psychological development: Secondary | ICD-10-CM | POA: Insufficient documentation

## 2018-03-16 DIAGNOSIS — F17210 Nicotine dependence, cigarettes, uncomplicated: Secondary | ICD-10-CM | POA: Diagnosis not present

## 2018-03-16 DIAGNOSIS — R4182 Altered mental status, unspecified: Secondary | ICD-10-CM | POA: Diagnosis present

## 2018-03-16 DIAGNOSIS — F209 Schizophrenia, unspecified: Secondary | ICD-10-CM | POA: Insufficient documentation

## 2018-03-16 DIAGNOSIS — Z79899 Other long term (current) drug therapy: Secondary | ICD-10-CM | POA: Diagnosis not present

## 2018-03-16 LAB — URINALYSIS, COMPLETE (UACMP) WITH MICROSCOPIC
Bacteria, UA: NONE SEEN
Bilirubin Urine: NEGATIVE
Glucose, UA: NEGATIVE mg/dL
Hgb urine dipstick: NEGATIVE
KETONES UR: 5 mg/dL — AB
Leukocytes, UA: NEGATIVE
Nitrite: NEGATIVE
PROTEIN: NEGATIVE mg/dL
Specific Gravity, Urine: 1.017 (ref 1.005–1.030)
pH: 6 (ref 5.0–8.0)

## 2018-03-16 LAB — CBC WITH DIFFERENTIAL/PLATELET
Basophils Absolute: 0.1 10*3/uL (ref 0–0.1)
Basophils Relative: 2 %
Eosinophils Absolute: 0 10*3/uL (ref 0–0.7)
Eosinophils Relative: 1 %
HCT: 33.1 % — ABNORMAL LOW (ref 35.0–47.0)
HEMOGLOBIN: 11 g/dL — AB (ref 12.0–16.0)
LYMPHS PCT: 34 %
Lymphs Abs: 2.2 10*3/uL (ref 1.0–3.6)
MCH: 27 pg (ref 26.0–34.0)
MCHC: 33.3 g/dL (ref 32.0–36.0)
MCV: 80.9 fL (ref 80.0–100.0)
Monocytes Absolute: 0.4 10*3/uL (ref 0.2–0.9)
Monocytes Relative: 6 %
NEUTROS PCT: 57 %
Neutro Abs: 3.8 10*3/uL (ref 1.4–6.5)
Platelets: 336 10*3/uL (ref 150–440)
RBC: 4.08 MIL/uL (ref 3.80–5.20)
RDW: 14.1 % (ref 11.5–14.5)
WBC: 6.6 10*3/uL (ref 3.6–11.0)

## 2018-03-16 LAB — COMPREHENSIVE METABOLIC PANEL
ALK PHOS: 52 U/L (ref 38–126)
ALT: 17 U/L (ref 0–44)
AST: 39 U/L (ref 15–41)
Albumin: 3.7 g/dL (ref 3.5–5.0)
Anion gap: 13 (ref 5–15)
BILIRUBIN TOTAL: 0.4 mg/dL (ref 0.3–1.2)
BUN: 14 mg/dL (ref 6–20)
CALCIUM: 8.9 mg/dL (ref 8.9–10.3)
CO2: 25 mmol/L (ref 22–32)
CREATININE: 0.71 mg/dL (ref 0.44–1.00)
Chloride: 101 mmol/L (ref 98–111)
Glucose, Bld: 128 mg/dL — ABNORMAL HIGH (ref 70–99)
Potassium: 3.9 mmol/L (ref 3.5–5.1)
Sodium: 139 mmol/L (ref 135–145)
Total Protein: 8 g/dL (ref 6.5–8.1)

## 2018-03-16 LAB — URINE DRUG SCREEN, QUALITATIVE (ARMC ONLY)
Amphetamines, Ur Screen: NOT DETECTED
Barbiturates, Ur Screen: NOT DETECTED
Cannabinoid 50 Ng, Ur ~~LOC~~: NOT DETECTED
Cocaine Metabolite,Ur ~~LOC~~: NOT DETECTED
MDMA (Ecstasy)Ur Screen: NOT DETECTED
Methadone Scn, Ur: NOT DETECTED
OPIATE, UR SCREEN: NOT DETECTED
PHENCYCLIDINE (PCP) UR S: NOT DETECTED
Tricyclic, Ur Screen: NOT DETECTED

## 2018-03-16 LAB — PREGNANCY, URINE: Preg Test, Ur: NEGATIVE

## 2018-03-16 LAB — VALPROIC ACID LEVEL
VALPROIC ACID LVL: 131 ug/mL — AB (ref 50.0–100.0)
Valproic Acid Lvl: 80 ug/mL (ref 50.0–100.0)

## 2018-03-16 LAB — ACETAMINOPHEN LEVEL

## 2018-03-16 LAB — ETHANOL: Alcohol, Ethyl (B): 13 mg/dL — ABNORMAL HIGH (ref <10)

## 2018-03-16 LAB — SALICYLATE LEVEL

## 2018-03-16 MED ORDER — SODIUM CHLORIDE 0.9 % IV BOLUS
1000.0000 mL | Freq: Once | INTRAVENOUS | Status: AC
  Administered 2018-03-16: 1000 mL via INTRAVENOUS

## 2018-03-16 MED ORDER — BENZTROPINE MESYLATE 1 MG/ML IJ SOLN
1.0000 mg | Freq: Once | INTRAMUSCULAR | Status: DC
  Filled 2018-03-16 (×3): qty 1

## 2018-03-16 MED ORDER — BENZTROPINE MESYLATE 1 MG/ML IJ SOLN
0.5000 mg | Freq: Once | INTRAMUSCULAR | Status: AC
  Administered 2018-03-16: 0.5 mg via INTRAVENOUS
  Filled 2018-03-16: qty 0.5

## 2018-03-16 MED ORDER — LORAZEPAM 2 MG/ML IJ SOLN
1.0000 mg | Freq: Once | INTRAMUSCULAR | Status: AC
  Administered 2018-03-16: 1 mg via INTRAVENOUS
  Filled 2018-03-16: qty 1

## 2018-03-16 MED ORDER — DIPHENHYDRAMINE HCL 50 MG/ML IJ SOLN
12.5000 mg | Freq: Once | INTRAMUSCULAR | Status: AC
  Administered 2018-03-16: 12.5 mg via INTRAVENOUS
  Filled 2018-03-16: qty 1

## 2018-03-16 NOTE — BH Assessment (Signed)
Writer called and left a HIPPA Compliant message with Group Home, ([phone]), requesting a return phone call.

## 2018-03-16 NOTE — ED Provider Notes (Signed)
-----------------------------------------   2:31 PM on 03/16/2018 -----------------------------------------  Patient is still groggy.  She is more awake than she has been however.  SOC is cleared however wants her to get some Cogentin.  We are waiting to make sure that she can go back to her group home TTS is checking on that.  When the patient is more awake we will plan on getting her back to the group home.  Her valproic acid level is now within the therapeutic range.   Arnaldo NatalMalinda, Paul F, MD 03/16/18 1432

## 2018-03-16 NOTE — Discharge Instructions (Addendum)
Follow[facility] with [facility] at [facility] within the next 1 to 2 weeks to discuss any necessary medication adjustments.  Return to the ER for any new, worsening, or persistent symptoms that concern you.

## 2018-03-16 NOTE — ED Notes (Signed)
Cogentin ordered - pharmacy aware not in our pyxis

## 2018-03-16 NOTE — ED Provider Notes (Signed)
-----------------------------------------   5:55 PM on 03/16/2018 -----------------------------------------  I took over care on this patient from Dr. Juliette AlcideMelinda.  [facility].  She is also medically cleared.  She was awaiting confirmation that she could return to her group home.  Per TTS, the patient has been accepted back to the group home and someone from the group home is coming to pick her up.  She is awake, alert, and ambulatory.  [facility].  Return precautions given, and the patient expresses understanding.   Dionne BucySiadecki, Ginnie Marich, MD 03/16/18 1756

## 2018-03-16 NOTE — ED Provider Notes (Signed)
She is now awake enough to answer questions but still very groggy.  We are awaiting her valproic acid repeat level and [facility]  She is still too groggy to be discharged at this point.   Arnaldo NatalMalinda, Paul F, MD 03/16/18 [id number]

## 2018-03-16 NOTE — ED Notes (Signed)
Per Jamar (group home Production designer, theatre/television/filmmanager) he attempted to reach Liisa's mother with no answer and a full mail box. Wants pt cabbed to the address [address] [place].. Jamar's phone number is [phone]. [place] is [phone]

## 2018-03-16 NOTE — ED Triage Notes (Signed)
Patient to ED via EMS from [facility] [phone] because she "got into her roommate's medications and took Haldol and Valproic acid." Patient is nonverbal on arrival to ED but follows commands and is cooperative.

## 2018-03-16 NOTE — ED Provider Notes (Signed)
[facility] Emergency Department Provider Note   ____________________________________________   First MD Initiated Contact with Patient 03/16/18 0502     (approximate)  I have reviewed the triage vital signs and the nursing notes.   HISTORY  Chief Complaint Drug Overdose (Haldol and Valproic acid)  Level V caveat: History limited by altered mentation  HPI Jenna Bell is a 32 y.o. female brought to the ED via EMS from group home.  States she took her roommates Haldol and valproic acid.  States she took 1000 mg valproic acid and 1 Haldol.  Does not tell me if this was accidental or intentional.  She is mostly nonverbal but utters a couple of words here and there.  Is able to follow commands.  Rest of history limited by altered mentation.   Past Medical History:  Diagnosis Date  . Bipolar disorder [facility](HCC)     Patient Active Problem List   Diagnosis Date Noted  . Developmental disability 02/20/2018  . Tobacco use disorder 02/12/2018  . Cannabis use disorder, moderate, dependence (HCC) 02/12/2018  . UTI (urinary tract infection) 02/12/2018  . Schizoaffective disorder, bipolar type (HCC) 02/11/2018    History reviewed. No pertinent surgical history.  Prior to Admission medications   Medication Sig Start Date End Date Taking? Authorizing Provider  ARIPiprazole ER (ABILIFY MAINTENA) 400 MG SRER injection Inject 2 mLs (400 mg total) into the muscle every 28 (twenty-eight) days. Next injection on 03/21/2018 03/21/18  Yes Pucilowska, Jolanta B, MD  divalproex (DEPAKOTE) 500 MG DR tablet Take 1 tablet (500 mg total) by mouth every 12 (twelve) hours. 03/06/18  Yes Pucilowska, Jolanta B, MD  haloperidol (HALDOL) 10 MG tablet Take 0.5 tablets (5 mg total) by mouth at bedtime. 03/10/18  Yes Emily FilbertWilliams, Jonathan E, MD  haloperidol decanoate (HALDOL DECANOATE) 100 MG/ML injection Inject 1 mL (100 mg total) into the muscle every 28 (twenty-eight) days. Next injection  on 04/04/2018 03/06/18  Yes Pucilowska, Jolanta B, MD  Multiple Vitamin (MULTIVITAMIN WITH MINERALS) TABS tablet Take 1 tablet by mouth daily. 03/07/18  Yes Pucilowska, Jolanta B, MD  vitamin B-12 1000 MCG tablet Take 1 tablet (1,000 mcg total) by mouth daily. 03/07/18  Yes Pucilowska, Ellin GoodieJolanta B, MD    Allergies Patient has no known allergies.  No family history on file.  Social History Social History   Tobacco Use  . Smoking status: Current Every Day Smoker    Packs/day: 0.25    Types: Cigarettes  . Smokeless tobacco: Never Used  Substance Use Topics  . Alcohol use: Yes    Alcohol/week: 2.0 standard drinks    Types: 2 Glasses of wine per week    Frequency: Never  . Drug use: Yes    Types: Methamphetamines    Review of Systems  Constitutional: No fever/chills Eyes: No visual changes. ENT: No sore throat. Cardiovascular: Denies chest pain. Respiratory: Denies shortness of breath. Gastrointestinal: No abdominal pain.  No nausea, no vomiting.  No diarrhea.  No constipation. Genitourinary: Negative for dysuria. Musculoskeletal: Negative for back pain. Skin: Negative for rash. Neurological: Negative for headaches, focal weakness or numbness. Psychiatric:Positive for bizarre behavior.  ____________________________________________   PHYSICAL EXAM:  VITAL SIGNS: ED Triage Vitals [03/16/18 0453]  Enc Vitals Group     BP      Pulse      Resp      Temp      Temp src      SpO2      Weight 129  lb 15.7 oz (59 kg)     Height 5\' 7"  (1.702 m)     Head Circumference      Peak Flow      Pain Score 0     Pain Loc      Pain Edu?      Excl. in GC?     Constitutional: Alert.  Disheveled appearing and in moderate acute distress. Eyes: Conjunctivae are normal. PERRL. EOMI. Wide staring eyes. Head: Atraumatic. Nose: Atraumatic. Mouth/Throat: Mucous membranes are moist.  No dental malocclusion. Neck: No stridor.  No cervical spine tenderness to palpation. Cardiovascular:  Tachycardiac rate, regular rhythm. Grossly normal heart sounds.  Good peripheral circulation. Respiratory: Normal respiratory effort.  No retractions. Lungs CTAB. Gastrointestinal: Soft and nontender. No distention. No abdominal bruits. No CVA tenderness. Musculoskeletal: No lower extremity tenderness nor edema.  No joint effusions. Neurologic: Mostly nonverbal but mumbling few words here and there.  Able to follow simple commands.  No gross focal neurologic deficits are appreciated. MAEx4.  Stiff movements resembling dystonia or dyskinesia. Skin:  Skin is warm, dry and intact. No rash noted.  No petechiae. Psychiatric: Mood and affect are bizarre. Speech and behavior are normal.  ____________________________________________   LABS (all labs ordered are listed, but only abnormal results are displayed)  Labs Reviewed  CBC WITH DIFFERENTIAL/PLATELET - Abnormal; Notable for the following components:      Result Value   Hemoglobin 11.0 (*)    HCT 33.1 (*)    All other components within normal limits  COMPREHENSIVE METABOLIC PANEL - Abnormal; Notable for the following components:   Glucose, Bld 128 (*)    All other components within normal limits  ACETAMINOPHEN LEVEL - Abnormal; Notable for the following components:   Acetaminophen (Tylenol), Serum <10 (*)    All other components within normal limits  ETHANOL - Abnormal; Notable for the following components:   Alcohol, Ethyl (B) 13 (*)    All other components within normal limits  URINE DRUG SCREEN, QUALITATIVE ([facility] ONLY) - Abnormal; Notable for the following components:   Benzodiazepine, Ur Scrn TEST NOT PERFORMED, REAGENT NOT AVAILABLE (*)    All other components within normal limits  URINALYSIS, COMPLETE (UACMP) WITH MICROSCOPIC - Abnormal; Notable for the following components:   Color, Urine YELLOW (*)    APPearance CLEAR (*)    Ketones, ur 5 (*)    All other components within normal limits  VALPROIC ACID LEVEL - Abnormal; Notable  for the following components:   Valproic Acid Lvl 131 (*)    All other components within normal limits  SALICYLATE LEVEL  PREGNANCY, URINE  VALPROIC ACID LEVEL  POC URINE PREG, ED   ____________________________________________  EKG  ED ECG REPORT I, SUNG,JADE J, the attending physician, personally viewed and interpreted this ECG.   Date: 03/16/2018  EKG Time: 0500  Rate: 117  Rhythm: sinus tachycardia  Axis: Normal  Intervals:none  ST&T Change: Nonspecific  ____________________________________________  RADIOLOGY  ED MD interpretation: CT head negative for intracranial hemorrhage, portable chest x-ray negative for acute cardiopulmonary process  Official radiology report(s): No results found.  ____________________________________________   PROCEDURES  Procedure(s) performed: None  Procedures  Critical Care performed: Yes, see critical care note(s)   CRITICAL CARE Performed by: Irean Hong   Total critical care time: 45 minutes  Critical care time was exclusive of separately billable procedures and treating other patients.  Critical care was necessary to treat or prevent imminent or life-threatening deterioration.  Critical care was time  spent personally by me on the following activities: development of treatment plan with patient and/or surrogate as well as nursing, discussions with consultants, evaluation of patient's response to treatment, examination of patient, obtaining history from patient or surrogate, ordering and performing treatments and interventions, ordering and review of laboratory studies, ordering and review of radiographic studies, pulse oximetry and re-evaluation of patient's condition.  ____________________________________________   INITIAL IMPRESSION / ASSESSMENT AND PLAN / ED COURSE  As part of my medical decision making, I reviewed the following data within the electronic MEDICAL RECORD NUMBER Nursing notes reviewed and incorporated, Labs  reviewed, EKG interpreted, Old chart reviewed, Radiograph reviewed, A consult was requested and obtained from this/these consultant(s) Psychiatry and Notes from prior ED visits   32 year old female with schizophrenia who took an extra 1000 mg Depakote and 10 mg Haldol.  I personally reviewed patient's old records and see that she was last seen in the ED on 03/10/2018.  These are actually the patient's personal medicines.  She was seen for medication intolerance and weakness and her Haldol dose was decreased to 5 mg daily.  Differential diagnosis includes but is not limited to medication reaction, exacerbation of psychiatric illness, infectious, metabolic etiologies, etc.  Will initiate IV fluid resuscitation, 1 mg IV Ativan given for calming, Cogentin and Benadryl given for dystonic movements.  Will obtain CT head to evaluate for intracranial hemorrhage.  Will consult TTS and [facility] psychiatry for erratic behavior.  Clinical Course as of Mar 23 353  Wynelle Link Mar 16, 2018  1610 Noted valproic acid level.  Will recheck after 2 L IV fluids.   [JS]  0709 She is sleeping soundly in no acute distress.  Heart rate has normalized.  CT head   [JS]    Clinical Course User Index [JS] Irean Hong, MD     ____________________________________________   FINAL CLINICAL IMPRESSION(S) / ED DIAGNOSES  Final diagnoses:  Schizophrenia, unspecified type [facility])     ED Discharge Orders    None       Note:  This document was prepared using Dragon voice recognition software and may include unintentional dictation errors.    Irean Hong, MD 03/23/18 [phone]

## 2018-03-16 NOTE — ED Notes (Signed)
Orders previously resulted signed off

## 2018-03-16 NOTE — BH Assessment (Signed)
Assessment Note  Jenna Bell is an 32 y.o. female who presents to the ER due to taking an overdose of her medications. According to the patient, she took the medications to "catch up" on her dosage. Patient denies it was an attempt to end her life. During the interview, she was lethargic and sleepy. It was difficult to engage her, writer had to keep waking her up. Patient was [facility] for psychosis and manic like behaviors. With this presentation she wasn't manic and able to complete her thoughts with a steady flow of thoughts.  Diagnosis: Bipolar  Past Medical History:  Past Medical History:  Diagnosis Date  . Bipolar disorder (HCC)     History reviewed. No pertinent surgical history.  Family History: No family history on file.  Social History:  reports that she has been smoking cigarettes. She has been smoking about 0.25 packs per day. She has never used smokeless tobacco. She reports that she drinks about 2.0 standard drinks of alcohol per week. She reports that she has current or past drug history. Drug: Methamphetamines.  Additional Social History:  Alcohol / Drug Use Pain Medications: See PTA Prescriptions: See PTA Over the Counter: See PTA History of alcohol / drug use?: No history of alcohol / drug abuse Longest period of sobriety (when/how long): Unable to assess Negative Consequences of Use: (n/a) Withdrawal Symptoms: (n/a)  CIWA: CIWA-Ar BP: 131/84 Pulse Rate: (!) 106 COWS:    Allergies: No Known Allergies  Home Medications:  (Not in a hospital admission)  OB/GYN Status:  Patient's last menstrual period was 02/10/2018 (lmp unknown).  General Assessment Data [facility] Assessment: In system Is this a Tele or Face-to-Face Assessment?: Face-to-Face Is this an Initial Assessment or a Re-assessment for this encounter?: Initial Assessment Marital status: Single Maiden name: n/a Is patient pregnant?:  No Pregnancy Status: No Living Arrangements: Group Home([facility]) Can pt return to current living arrangement?: (Unknown) Admission Status: Involuntary Is patient capable of signing voluntary admission?: No(No) Referral Source: Self/Family/Friend Insurance type: None  Medical Screening Exam [facility] Walk-[facility] ONLY) Medical Exam completed: Yes  Crisis Care Plan Living Arrangements: Group Home([facility]) Legal Guardian: Other:(Self) Name of Psychiatrist: Reports of none Name of Therapist: Reports of none  Education Status Is patient currently in school?: No Highest grade of school patient has completed: 4 years of college Is the patient employed, unemployed or receiving disability?: Unemployed  Risk to self with the past 6 months Suicidal Ideation: No Has patient been a risk to self within the past 6 months prior to admission? : No Suicidal Intent: No Has patient had any suicidal intent within the past 6 months prior to admission? : No Is patient at risk for suicide?: No Suicidal Plan?: No Has patient had any suicidal plan within the past 6 months prior to admission? : No Access to Means: No What has been your use of drugs/alcohol within the last 12 months?: Alcohol Previous Attempts/Gestures: No How many times?: 0 Other Self Harm Risks: Reports of none Triggers for Past Attempts: None known Intentional Self Injurious Behavior: None Family Suicide History: Unknown Recent stressful life event(s): Other (Comment) Persecutory voices/beliefs?: No Depression: No Depression Symptoms: Isolating, Fatigue Substance abuse history and/or treatment for substance abuse?: Yes Suicide prevention information given to non-admitted patients: Not applicable  Risk to Others within the past 6 months Homicidal Ideation: No Does patient have any lifetime risk of violence toward others beyond the six months prior  to admission? : No Thoughts of Harm to Others: No Current Homicidal Intent:  No Current Homicidal Plan: No Access to Homicidal Means: No Identified Victim: Reports of none History of harm to others?: Yes Violent Behavior Description: Reports of none Does patient have access to weapons?: No Criminal Charges Pending?: No Does patient have a court date: No Is patient on probation?: No  Psychosis Hallucinations: None noted Delusions: None noted  Mental Status Report Appearance/Hygiene: Disheveled Eye Contact: Poor Motor Activity: Unable to assess(Laying in the bed) Speech: Soft, Slurred Level of Consciousness: Drowsy, Sleeping Mood: Sad Affect: Sad Anxiety Level: None Thought Processes: Relevant Judgement: Partial Orientation: Person, Place Obsessive Compulsive Thoughts/Behaviors: Minimal  Cognitive Functioning Concentration: Decreased Memory: Recent Impaired, Remote Intact Is patient IDD: No Is patient DD?: No Insight: Poor Impulse Control: Poor Appetite: Fair Have you had any weight changes? : No Change Sleep: No Change Total Hours of Sleep: 8 Vegetative Symptoms: None  ADLScreening [facility] Assessment Services) Patient's cognitive ability adequate to safely complete daily activities?: Yes Patient able to express need for assistance with ADLs?: Yes Independently performs ADLs?: Yes (appropriate for developmental age)  Prior Inpatient Therapy Prior Inpatient Therapy: Yes Prior Therapy Dates: 02/2018 Prior Therapy Facilty/Provider(s): [facility] Reason for Treatment: Psychosis  Prior Outpatient Therapy Prior Outpatient Therapy: No Does patient have Intensive In-House Services?  : No Does patient have [facility] services? : No Does patient have P4CC services?: No  ADL Screening (condition at time of admission) Patient's cognitive ability adequate to safely complete daily activities?: Yes Is the patient deaf or have difficulty hearing?: No Does the patient have difficulty seeing, even when wearing glasses/contacts?: No Does the patient have  difficulty concentrating, remembering, or making decisions?: No Patient able to express need for assistance with ADLs?: Yes Does the patient have difficulty dressing or bathing?: No Independently performs ADLs?: Yes (appropriate for developmental age) Does the patient have difficulty walking or climbing stairs?: No Weakness of Legs: None Weakness of Arms/Hands: None  Home Assistive Devices/Equipment Home Assistive Devices/Equipment: None  Therapy Consults (therapy consults require a physician order) PT Evaluation Needed: No OT Evalulation Needed: No SLP Evaluation Needed: No Abuse/Neglect Assessment (Assessment to be complete while patient is alone) Abuse/Neglect Assessment Can Be Completed: Yes Physical Abuse: Denies Verbal Abuse: Denies Sexual Abuse: Denies Exploitation of patient/patient's resources: Denies Self-Neglect: Denies Values / Beliefs Cultural Requests During Hospitalization: None Spiritual Requests During Hospitalization: None Consults Spiritual Care Consult Needed: No Social Work Consult Needed: No Merchant navy officer (For Healthcare) Does Patient Have a Medical Advance Directive?: No Would patient like information on creating a medical advance directive?: No - Patient declined       Child/Adolescent Assessment Running Away Risk: Denies(Patient is an adult)  Disposition:  Disposition Initial Assessment Completed for this Encounter: Yes  On Site Evaluation by:   Reviewed with Physician:    Lilyan Gilford MS, LCAS, LPC, NCC, CCSI Therapeutic Triage Specialist 03/16/2018 3:48 PM

## 2018-03-16 NOTE — ED Notes (Signed)
Patient is speaking with S.O.C.  Dr. Darlyn ReadSpraug.

## 2019-01-18 IMAGING — CT CT HEAD W/O CM
3 series · 15 of 46 positions shown, 18 images · non-contrast
Comparison: CT of the head performed 02/11/2018

CLINICAL DATA: Status post drug overdose. Patient unresponsive.

EXAM:
CT HEAD WITHOUT CONTRAST
TECHNIQUE: Contiguous axial images were obtained from the base of the skull
through the vertex without intravenous contrast.

[Series 3: head wo · axial · 0.42mm/px · z∈[-107,+13]mm · 9 of 29 slices shown, 12 images]
[im 3/29  brain]
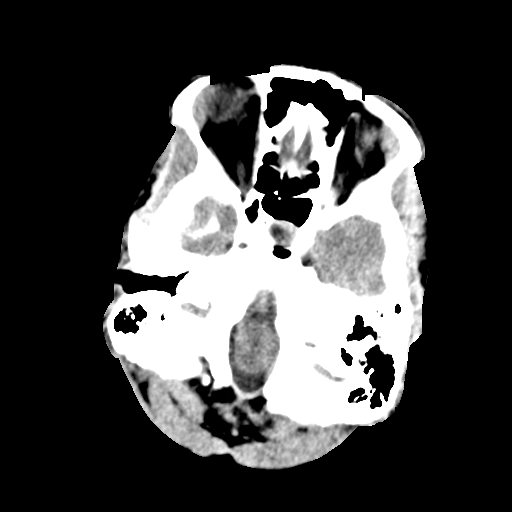
[im 3/29  bone]
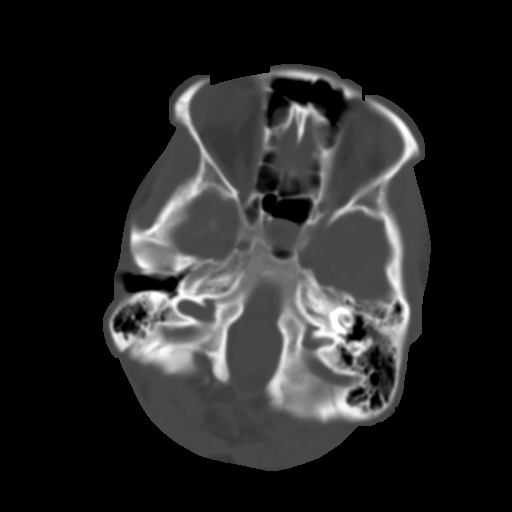
[im 6/29  brain]
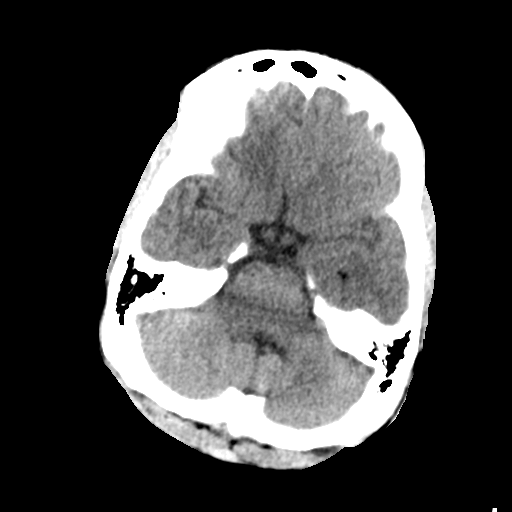
[im 9/29  brain]
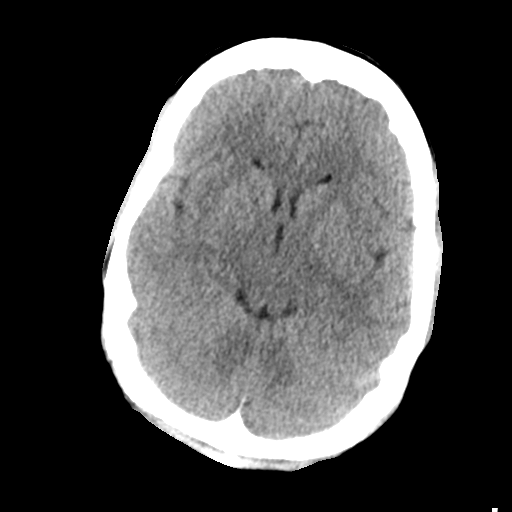
[im 12/29  brain]
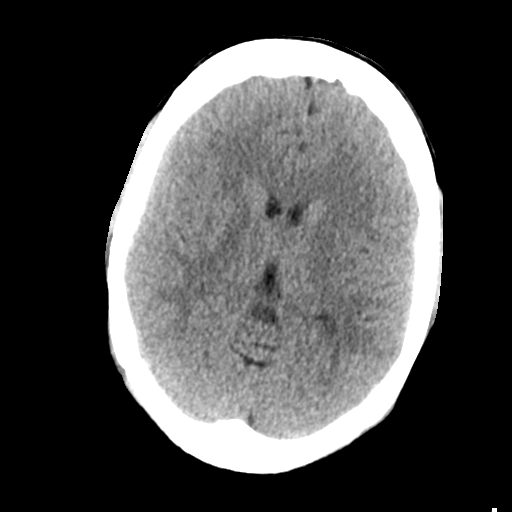
[im 15/29  brain]
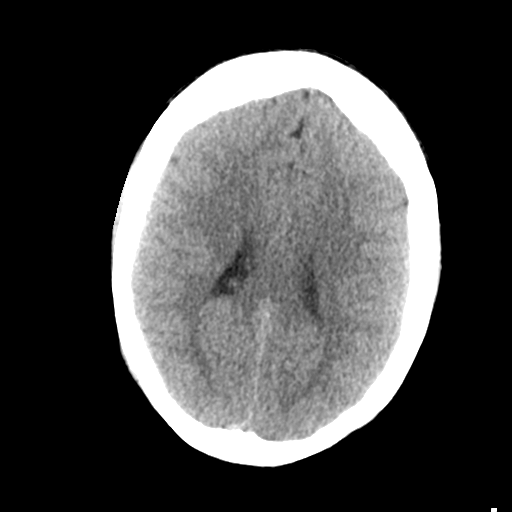
[im 15/29  bone]
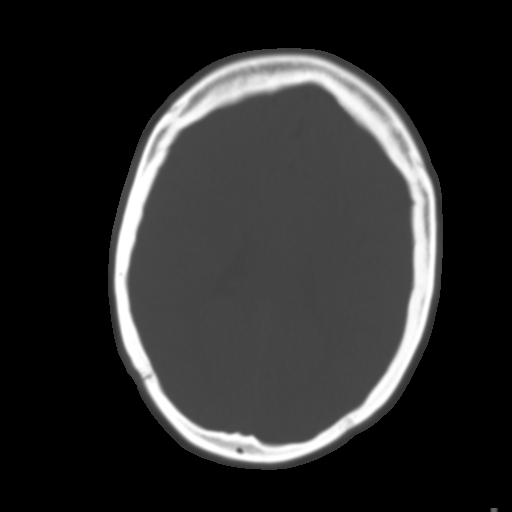
[im 18/29  brain]
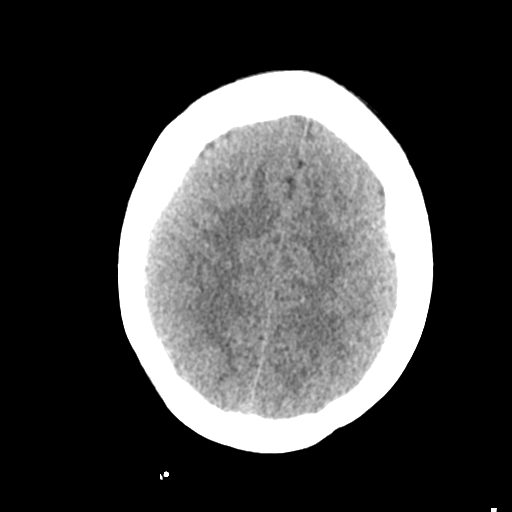
[im 21/29  brain]
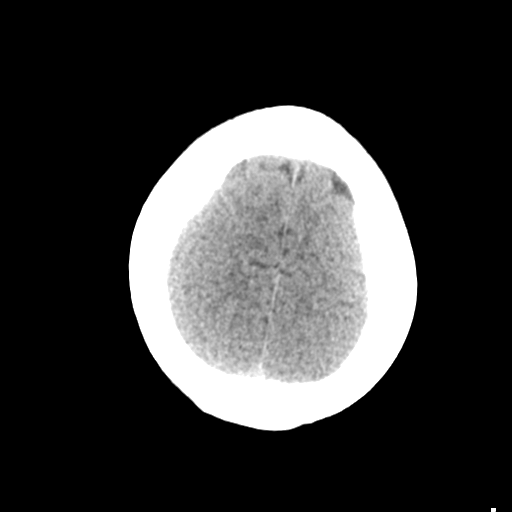
[im 24/29  brain]
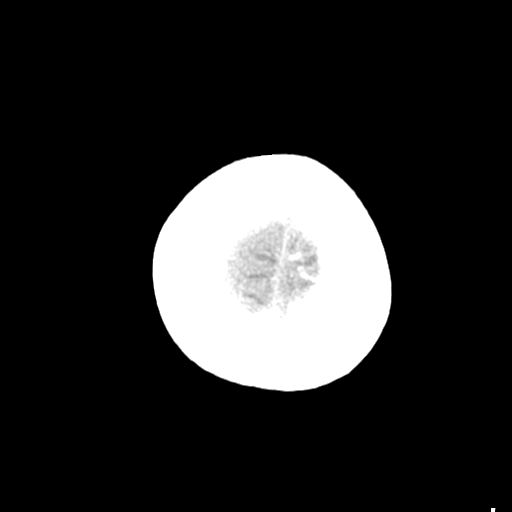
[im 27/29  brain]
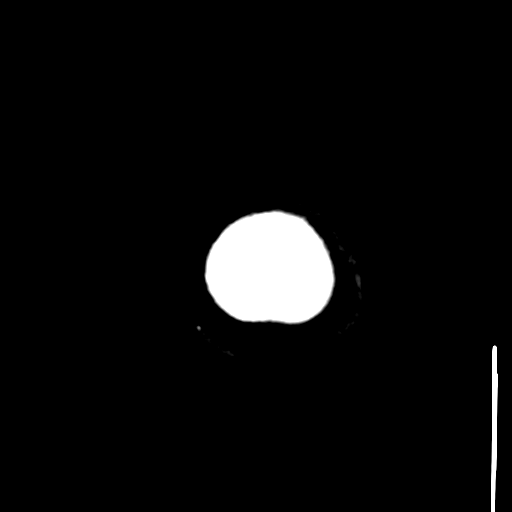
[im 27/29  bone]
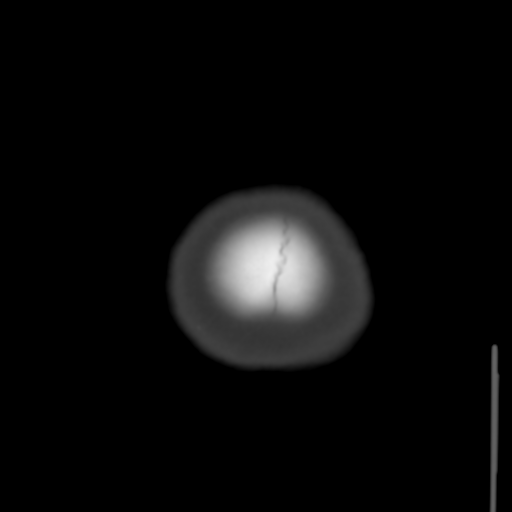

[Series 4: coronal soft tissue · coronal · 0.28mm/px · 3 of 64 slices shown]
[im 22/64  brain]
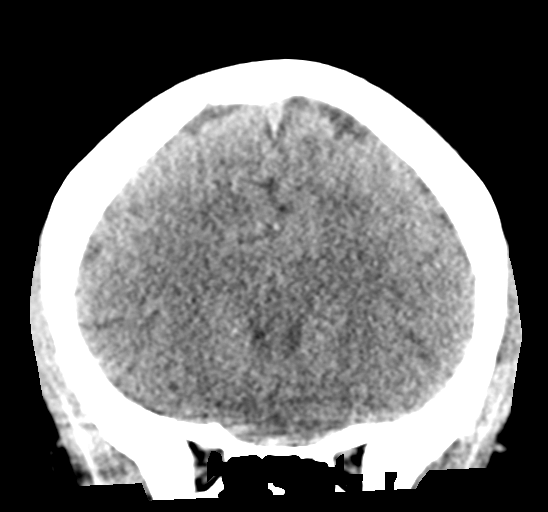
[im 29/64  brain]
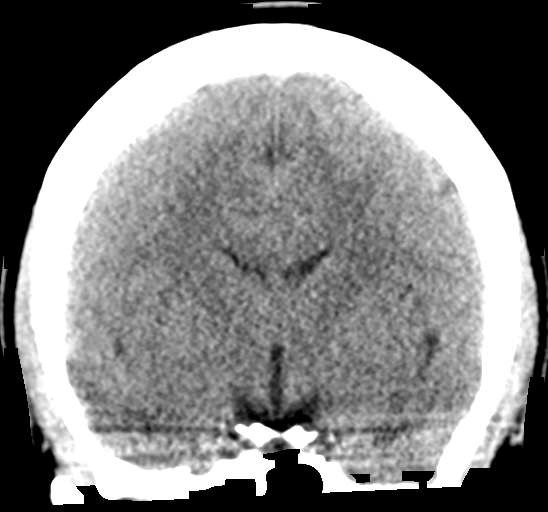
[im 36/64  brain]
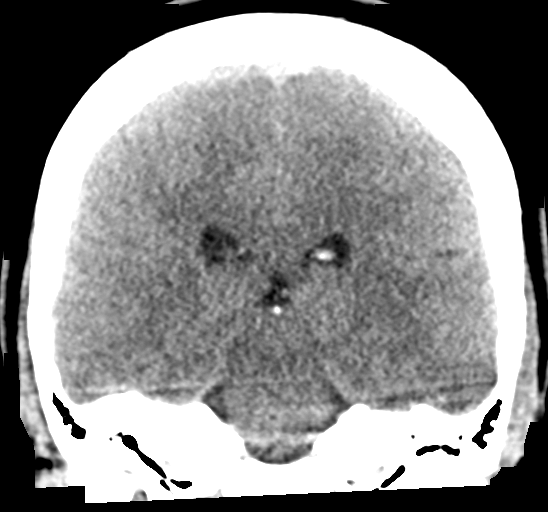

[Series 5: sagittal soft tissue · sagittal · 0.28mm/px · 3 of 51 slices shown]
[im 17/51  brain]
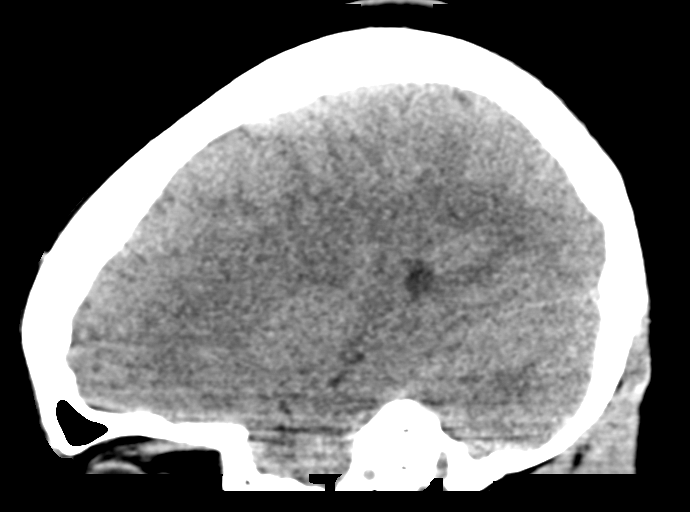
[im 26/51  brain]
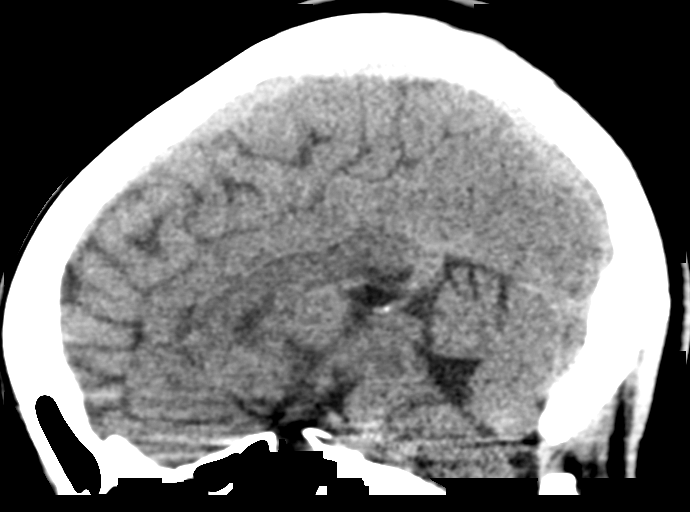
[im 34/51  brain]
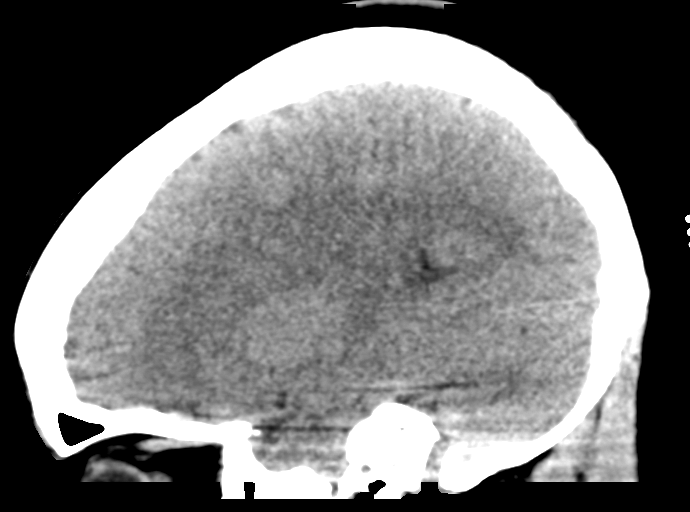

[15 of 46 positions shown; findings below may reference images not displayed]

FINDINGS: Brain: No evidence of acute infarction, hemorrhage, hydrocephalus,
extra-axial collection or mass lesion/mass effect.

The posterior fossa, including the cerebellum, brainstem and fourth
ventricle, is within normal limits. The third and lateral
ventricles, and basal ganglia are unremarkable in appearance. The
cerebral hemispheres are symmetric in appearance, with normal
gray-white differentiation. No mass effect or midline shift is seen.

Vascular: No hyperdense vessel or unexpected calcification.

Skull: There is no evidence of fracture; visualized osseous
structures are unremarkable in appearance.

Sinuses/Orbits: The visualized portions of the orbits are within
normal limits. The paranasal sinuses and mastoid air cells are
well-aerated.

Other: No significant soft tissue abnormalities are seen.
IMPRESSION: Unremarkable noncontrast CT of the head.

## 2019-06-20 ENCOUNTER — Other Ambulatory Visit: Payer: Self-pay

## 2019-06-20 ENCOUNTER — Emergency Department: Payer: Medicaid Other

## 2019-06-20 ENCOUNTER — Emergency Department
Admission: EM | Admit: 2019-06-20 | Discharge: 2019-06-20 | Disposition: A | Discharge status: Home / Self Care | Payer: Medicaid Other | Attending: Emergency Medicine | Admitting: Emergency Medicine

## 2019-06-20 DIAGNOSIS — F17210 Nicotine dependence, cigarettes, uncomplicated: Secondary | ICD-10-CM | POA: Diagnosis not present

## 2019-06-20 DIAGNOSIS — M79604 Pain in right leg: Secondary | ICD-10-CM

## 2019-06-20 DIAGNOSIS — Z79899 Other long term (current) drug therapy: Secondary | ICD-10-CM | POA: Diagnosis not present

## 2019-06-20 DIAGNOSIS — L509 Urticaria, unspecified: Secondary | ICD-10-CM

## 2019-06-20 LAB — CBC
HCT: 36.1 % (ref 36.0–46.0)
Hemoglobin: 11.8 g/dL — ABNORMAL LOW (ref 12.0–15.0)
MCH: 26 pg (ref 26.0–34.0)
MCHC: 32.7 g/dL (ref 30.0–36.0)
MCV: 79.7 fL — ABNORMAL LOW (ref 80.0–100.0)
Platelets: 461 10*3/uL — ABNORMAL HIGH (ref 150–400)
RBC: 4.53 MIL/uL (ref 3.87–5.11)
RDW: 15 % (ref 11.5–15.5)
WBC: 7.7 10*3/uL (ref 4.0–10.5)
nRBC: 0 % (ref 0.0–0.2)

## 2019-06-20 LAB — BASIC METABOLIC PANEL
Anion gap: 10 (ref 5–15)
BUN: 19 mg/dL (ref 6–20)
CO2: 24 mmol/L (ref 22–32)
Calcium: 9.4 mg/dL (ref 8.9–10.3)
Chloride: 103 mmol/L (ref 98–111)
Creatinine, Ser: 0.88 mg/dL (ref 0.44–1.00)
GFR calc Af Amer: >60 mL/min (ref >60)
GFR calc non Af Amer: >60 mL/min (ref >60)
Glucose, Bld: 110 mg/dL — ABNORMAL HIGH (ref 70–99)
Potassium: 4.1 mmol/L (ref 3.5–5.1)
Sodium: 137 mmol/L (ref 135–145)

## 2019-06-20 MED ORDER — EPINEPHRINE 0.3 MG/0.3ML IJ SOAJ: 0.3000 mg | INTRAMUSCULAR | 0 refills | Status: AC | PRN

## 2019-06-20 MED ORDER — FAMOTIDINE 20 MG PO TABS
20.0000 mg | ORAL_TABLET | Freq: Once | ORAL | Status: AC
  Administered 2019-06-20: 20 mg via ORAL
  Filled 2019-06-20: qty 1

## 2019-06-20 MED ORDER — PREDNISONE 20 MG PO TABS
60.0000 mg | ORAL_TABLET | Freq: Once | ORAL | Status: AC
  Administered 2019-06-20: 07:00:00 60 mg via ORAL
  Filled 2019-06-20: qty 3

## 2019-06-20 MED ORDER — DIPHENHYDRAMINE HCL 25 MG PO CAPS
50.0000 mg | ORAL_CAPSULE | Freq: Once | ORAL | Status: AC
  Administered 2019-06-20: 07:00:00 50 mg via ORAL
  Filled 2019-06-20: qty 2

## 2019-06-20 MED ORDER — PREDNISONE 20 MG PO TABS: 60.0000 mg | ORAL_TABLET | Freq: Every day | ORAL | 0 refills | Status: AC

## 2019-06-20 NOTE — ED Provider Notes (Signed)
[facility] Emergency Department Provider Note ______   First MD Initiated Contact with Patient 06/20/19 [phone]     (approximate)  I have reviewed the triage vital signs and the nursing notes.   HISTORY  Chief Complaint Leg Pain    HPI Jenna Bell is a 33 y.o. female with below list of previous medical conditions presents to the emergency department secondary to nontraumatic right thigh pain and intermittent hives times "months".  Regarding the patient's leg pain she denies any trauma.  Patient denies any swelling.  Patient denies any difficulty breathing or chest pain.  Regarding the patient's hives she denies any known allergens.  Patient denies any difficulty breathing or swallowing.  Patient states that she is unsure as to what she may be allergic to.        Past Medical History:  Diagnosis Date  . Bipolar disorder [facility])     Patient Active Problem List   Diagnosis Date Noted  . Developmental disability 02/20/2018  . Tobacco use disorder 02/12/2018  . Cannabis use disorder, moderate, dependence (HCC) 02/12/2018  . UTI (urinary tract infection) 02/12/2018  . Schizoaffective disorder, bipolar type (HCC) 02/11/2018    History reviewed. No pertinent surgical history.  Prior to Admission medications   Medication Sig Start Date End Date Taking? Authorizing Provider  ARIPiprazole ER (ABILIFY MAINTENA) 400 MG SRER injection Inject 2 mLs (400 mg total) into the muscle every 28 (twenty-eight) days. Next injection on 03/21/2018 03/21/18   Pucilowska, Braulio Conte B, MD  divalproex (DEPAKOTE) 500 MG DR tablet Take 1 tablet (500 mg total) by mouth every 12 (twelve) hours. 03/06/18   Pucilowska, Jolanta B, MD  haloperidol (HALDOL) 10 MG tablet Take 0.5 tablets (5 mg total) by mouth at bedtime. 03/10/18   Emily Filbert, MD  haloperidol decanoate (HALDOL DECANOATE) 100 MG/ML injection Inject 1 mL (100 mg total) into the muscle every 28 (twenty-eight) days.  Next injection on 04/04/2018 03/06/18   Pucilowska, Ellin Goodie, MD  Multiple Vitamin (MULTIVITAMIN WITH MINERALS) TABS tablet Take 1 tablet by mouth daily. 03/07/18   Pucilowska, Ellin Goodie, MD  vitamin B-12 1000 MCG tablet Take 1 tablet (1,000 mcg total) by mouth daily. 03/07/18   Pucilowska, Ellin Goodie, MD    Allergies Patient has no known allergies.  No family history on file.  Social History Social History   Tobacco Use  . Smoking status: Current Every Day Smoker    Packs/day: 0.25    Types: Cigarettes  . Smokeless tobacco: Never Used  Substance Use Topics  . Alcohol use: Yes    Alcohol/week: 2.0 standard drinks    Types: 2 Glasses of wine per week    Frequency: Never  . Drug use: Yes    Types: Methamphetamines    Review of Systems Constitutional: No fever/chills Eyes: No visual changes. ENT: No sore throat. Cardiovascular: Denies chest pain. Respiratory: Denies shortness of breath. Gastrointestinal: No abdominal pain.  No nausea, no vomiting.  No diarrhea.  No constipation. Genitourinary: Negative for dysuria. Musculoskeletal: Negative for neck pain.  Negative for back pain.  Positive for nontraumatic right thigh pain Integumentary: Positive for hives Neurological: Negative for headaches, focal weakness or numbness.  ____________________________________________   PHYSICAL EXAM:  VITAL SIGNS: ED Triage Vitals  Enc Vitals Group     BP 06/20/19 0241 106/68     Pulse Rate 06/20/19 0241 83     Resp 06/20/19 0241 17     Temp 06/20/19 0241 98.1 F (36.7  C)     Temp Source 06/20/19 0241 Oral     SpO2 06/20/19 0241 97 %     Weight 06/20/19 0236 63.5 kg (140 lb)     Height 06/20/19 0236 1.702 m (5\' 7" )     Head Circumference --      Peak Flow --      Pain Score 06/20/19 0236 10     Pain Loc --      Pain Edu? --      Excl. in GC? --     Constitutional: Alert and oriented.  Eyes: Conjunctivae are normal.  Mouth/Throat: Patient is wearing a mask. Neck: No stridor.  No  meningeal signs.   Cardiovascular: Normal rate, regular rhythm. Good peripheral circulation. Grossly normal heart sounds. Respiratory: Normal respiratory effort.  No retractions. Gastrointestinal: Soft and nontender. No distention.  Musculoskeletal: Right lateral thigh pain with palpation.  Equal palpable PT DP bilaterally.  No edema. Neurologic:  Normal speech and language. No gross focal neurologic deficits are appreciated.  Skin: Positive for generalized hives Psychiatric: Mood and affect are normal. Speech and behavior are normal.  ____________________________________________   LABS (all labs ordered are listed, but only abnormal results are displayed)  Labs Reviewed  CBC - Abnormal; Notable for the following components:      Result Value   Hemoglobin 11.8 (*)    MCV 79.7 (*)    Platelets 461 (*)    All other components within normal limits  BASIC METABOLIC PANEL - Abnormal; Notable for the following components:   Glucose, Bld 110 (*)    All other components within normal limits    RADIOLOGY I, Worthington N , personally viewed and evaluated these images (plain radiographs) as part of my medical decision making, as well as reviewing the written report by the radiologist.  ED MD interpretation: No evidence of DVT noted on ultrasound.  Official radiology report(s): [place] Venous Img Lower Unilateral Right  Result Date: 06/20/2019 CLINICAL DATA:  Leg pain for 1 day EXAM: RIGHT LOWER EXTREMITY VENOUS DOPPLER ULTRASOUND TECHNIQUE: Gray-scale sonography with graded compression, as well as color Doppler and duplex ultrasound were performed to evaluate the lower extremity deep venous systems from the level of the common femoral vein and including the common femoral, femoral, profunda femoral, popliteal and calf veins including the posterior tibial, peroneal and gastrocnemius veins when visible. The superficial great saphenous vein was also interrogated. Spectral Doppler was utilized to  evaluate flow at rest and with distal augmentation maneuvers in the common femoral, femoral and popliteal veins. COMPARISON:  None. FINDINGS: Contralateral Common Femoral Vein: Respiratory phasicity is normal and symmetric with the symptomatic side. No evidence of thrombus. Normal compressibility. Common Femoral Vein: No evidence of thrombus. Normal compressibility, respiratory phasicity and response to augmentation. Saphenofemoral Junction: No evidence of thrombus. Normal compressibility and flow on color Doppler imaging. Profunda Femoral Vein: No evidence of thrombus. Normal compressibility and flow on color Doppler imaging. Femoral Vein: No evidence of thrombus. Normal compressibility, respiratory phasicity and response to augmentation. Popliteal Vein: No evidence of thrombus. Normal compressibility, respiratory phasicity and response to augmentation. Calf Veins: No evidence of thrombus. Normal compressibility and flow on color Doppler imaging. Superficial Great Saphenous Vein: No evidence of thrombus. Normal compressibility. Venous Reflux:  None. Other Findings:  None. IMPRESSION: No evidence of deep venous thrombosis. Electronically Signed   By: Deatra RobinsonKevin  Herman M.D.   On: 06/20/2019 05:41      Procedures   ____________________________________________   INITIAL IMPRESSION /  MDM / ASSESSMENT AND PLAN / ED COURSE  As part of my medical decision making, I reviewed the following data within the electronic MEDICAL RECORD NUMBER   33 year old female presented with above-stated history and physical exam secondary to nontraumatic right thigh pain for which ultrasound revealed no evidence of DVT.  Laboratory data including potassium normal.  Regarding the patient's hives patient was given Benadryl 50 mg prednisone 60 mg and Pepcid 20 mg p.o.  ____________________________________________  FINAL CLINICAL IMPRESSION(S) / ED DIAGNOSES  Final diagnoses:  Right leg pain  Hives     MEDICATIONS GIVEN DURING  THIS VISIT:  Medications  diphenhydrAMINE (BENADRYL) capsule 50 mg (has no administration in time range)  famotidine (PEPCID) tablet 20 mg (has no administration in time range)  predniSONE (DELTASONE) tablet 60 mg (has no administration in time range)     ED Discharge Orders    None      *Please note:  Jasma Seevers Delangel was evaluated in Emergency Department on 06/20/2019 for the symptoms described in the history of present illness. She was evaluated in the context of the global COVID-19 pandemic, which necessitated consideration that the patient might be at risk for infection with the SARS-CoV-2 virus that causes COVID-19. Institutional protocols and algorithms that pertain to the evaluation of patients at risk for COVID-19 are in a state of rapid change based on information released by regulatory bodies including the CDC and federal and state organizations. These policies and algorithms were followed during the patient's care in the ED.  Some ED evaluations and interventions may be delayed as a result of limited staffing during the pandemic.*  Note:  This document was prepared using Dragon voice recognition software and may include unintentional dictation errors.   Gregor Hams, MD 06/20/19 [phone]

## 2019-06-20 NOTE — ED Triage Notes (Signed)
Pt arrives to ED via POV from home with c/o right leg pain x1 day. Pt denies any recent injury, fall, or trauma. Pt reports "the whole leg is sore"; CMS intact. Pt is A&O, in NAD; RR even, regular, and unlabored.

## 2019-06-20 NOTE — ED Notes (Signed)
Pt verbalized understanding of discharge instructions. NAD at this time. 

## 2019-08-29 ENCOUNTER — Other Ambulatory Visit: Payer: Self-pay

## 2019-08-29 ENCOUNTER — Emergency Department
Admission: EM | Admit: 2019-08-29 | Discharge: 2019-08-31 | Disposition: A | Discharge status: Other Acute Inpatient Hospital | Payer: Medicaid Other | Attending: Emergency Medicine | Admitting: Emergency Medicine

## 2019-08-29 DIAGNOSIS — Z20822 Contact with and (suspected) exposure to covid-19: Secondary | ICD-10-CM | POA: Insufficient documentation

## 2019-08-29 DIAGNOSIS — F250 Schizoaffective disorder, bipolar type: Secondary | ICD-10-CM | POA: Diagnosis present

## 2019-08-29 DIAGNOSIS — R45850 Homicidal ideations: Secondary | ICD-10-CM | POA: Insufficient documentation

## 2019-08-29 DIAGNOSIS — F17210 Nicotine dependence, cigarettes, uncomplicated: Secondary | ICD-10-CM | POA: Diagnosis not present

## 2019-08-29 LAB — COMPREHENSIVE METABOLIC PANEL
ALT: 35 U/L (ref 0–44)
AST: 45 U/L — ABNORMAL HIGH (ref 15–41)
Albumin: 3.8 g/dL (ref 3.5–5.0)
Alkaline Phosphatase: 77 U/L (ref 38–126)
Anion gap: 10 (ref 5–15)
BUN: 19 mg/dL (ref 6–20)
CO2: 25 mmol/L (ref 22–32)
Calcium: 9.2 mg/dL (ref 8.9–10.3)
Chloride: 101 mmol/L (ref 98–111)
Creatinine, Ser: 0.88 mg/dL (ref 0.44–1.00)
GFR calc Af Amer: >60 mL/min (ref >60)
GFR calc non Af Amer: >60 mL/min (ref >60)
Glucose, Bld: 97 mg/dL (ref 70–99)
Potassium: 3.1 mmol/L — ABNORMAL LOW (ref 3.5–5.1)
Sodium: 136 mmol/L (ref 135–145)
Total Bilirubin: 0.6 mg/dL (ref 0.3–1.2)
Total Protein: 8.7 g/dL — ABNORMAL HIGH (ref 6.5–8.1)

## 2019-08-29 LAB — CBC
HCT: 35.9 % — ABNORMAL LOW (ref 36.0–46.0)
Hemoglobin: 11.6 g/dL — ABNORMAL LOW (ref 12.0–15.0)
MCH: 25.9 pg — ABNORMAL LOW (ref 26.0–34.0)
MCHC: 32.3 g/dL (ref 30.0–36.0)
MCV: 80.1 fL (ref 80.0–100.0)
Platelets: 409 10*3/uL — ABNORMAL HIGH (ref 150–400)
RBC: 4.48 MIL/uL (ref 3.87–5.11)
RDW: 14.1 % (ref 11.5–15.5)
WBC: 14.7 10*3/uL — ABNORMAL HIGH (ref 4.0–10.5)
nRBC: 0 % (ref 0.0–0.2)

## 2019-08-29 LAB — POCT PREGNANCY, URINE: Preg Test, Ur: NEGATIVE

## 2019-08-29 LAB — ETHANOL: Alcohol, Ethyl (B): <10 mg/dL (ref <10)

## 2019-08-29 NOTE — ED Notes (Signed)
Pt. Stats she is not currently on any daily medications.

## 2019-08-29 NOTE — ED Notes (Signed)
Pt. Asked "Do you know why you are here in the ED today"  Pt. States, "I do not know".  Pt. Asked, "Did you get into argument with family today"  Pt. States "I have not seen my family today"  Pt. Asked are you having any Suicidal thoughts or homicidal thoughts, Pt. Denies.  Pt. Calm and cooperative at this time.  Pt. Requested and was given snack and drink while waiting to talk to Psych. Team.

## 2019-08-29 NOTE — ED Notes (Signed)
Pt dressed out by this tech and Rosey Bath, Charity fundraiser. Pts belongings consisted of gray t-shirt, gray bra, pink jacket, cell phone, purse, wallet, underwear, black pants, nike tennis shoes, and socks.
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
## 2019-08-30 DIAGNOSIS — F250 Schizoaffective disorder, bipolar type: Secondary | ICD-10-CM

## 2019-08-30 LAB — SARS CORONAVIRUS 2 (TAT 6-24 HRS): SARS Coronavirus 2: NEGATIVE

## 2019-08-30 MED ORDER — DIPHENHYDRAMINE HCL 50 MG/ML IJ SOLN: 50.0000 mg | Freq: Two times a day (BID) | INTRAMUSCULAR | Status: DC

## 2019-08-30 MED ORDER — HALOPERIDOL LACTATE 5 MG/ML IJ SOLN: 5.0000 mg | Freq: Two times a day (BID) | INTRAMUSCULAR | Status: DC

## 2019-08-30 MED ORDER — HALOPERIDOL 5 MG PO TABS
10.0000 mg | ORAL_TABLET | Freq: Two times a day (BID) | ORAL | Status: DC
  Administered 2019-08-30 – 2019-08-31 (×2): 10 mg via ORAL
  Filled 2019-08-30 (×2): qty 2

## 2019-08-30 MED ORDER — DIPHENHYDRAMINE HCL 25 MG PO CAPS: 50.0000 mg | ORAL_CAPSULE | Freq: Two times a day (BID) | ORAL | Status: DC

## 2019-08-30 MED ORDER — BENZTROPINE MESYLATE 1 MG PO TABS
1.0000 mg | ORAL_TABLET | Freq: Every day | ORAL | Status: DC
  Administered 2019-08-30 – 2019-08-31 (×2): 1 mg via ORAL
  Filled 2019-08-30 (×2): qty 1

## 2019-08-30 MED ORDER — DIPHENHYDRAMINE HCL 25 MG PO CAPS
50.0000 mg | ORAL_CAPSULE | Freq: Two times a day (BID) | ORAL | Status: DC
  Administered 2019-08-30 – 2019-08-31 (×2): 50 mg via ORAL
  Filled 2019-08-30 (×2): qty 2

## 2019-08-30 NOTE — ED Notes (Signed)
IVC/ Pending Admit when cleared

## 2019-08-30 NOTE — ED Notes (Signed)
Report to include situation, background, assessment and recommendations from RN Jessica. Patient sleeping, respirations regular and unlabored. Q15 minute rounds and security camera observation to continue.   
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
## 2019-08-30 NOTE — ED Notes (Signed)
Pt. Taken to interview room to talk to TTS and NP via video feed from [facility] in [place].  Security escorted pt. To interview room.

## 2019-08-30 NOTE — Progress Notes (Signed)
Patient meets criteria for inpatient treatment per Lerry Liner, NP. No appropriate beds at [facility] or [facility] currently. CSW faxed referrals to the following facilities for review:  [facility]   [facility]   [facility]   [facility]   [facility]   [facility]   [facility]   [facility]   [facility]   [facility]     TTS will continue to seek bed placement.     Ruthann Cancer MSW, [facility] Social Worker Disposition  [facility] Ph: [phone] Fax: [phone] 08/30/2019 11:50 AM

## 2019-08-30 NOTE — ED Notes (Signed)
Pt. Escorted back to 20 Hallway bed from interview room.

## 2019-08-30 NOTE — ED Notes (Signed)
Pt given breakfast tray

## 2019-08-30 NOTE — ED Notes (Signed)
Hourly rounding reveals patient sleeping in room. No complaints, stable, in no acute distress. Q15 minute rounds and monitoring via Security Cameras to continue. 

## 2019-08-30 NOTE — ED Notes (Signed)
Introduced self to pt. Pt shown around the unit. Pt up independently to restroom. Pt given water and crackers before breakfast. Pt calm, cooperative, and pleasant.

## 2019-08-30 NOTE — ED Notes (Signed)
TTS called back, psych. Team recommends inpatient services for patient.

## 2019-08-30 NOTE — ED Notes (Signed)
Pt given meal tray.
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
## 2019-08-30 NOTE — ED Notes (Signed)
Pt talking to herself in her room and waving her arms about real animated. Pt calm and cooperative and pleasant in conversation.
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
## 2019-08-31 ENCOUNTER — Inpatient Hospital Stay
Admission: RE | Admit: 2019-08-31 | Discharge: 2019-09-07 | DRG: 885 | Disposition: A | Discharge status: Home / Self Care | Payer: Medicaid Other | Source: Intra-hospital | Attending: Psychiatry | Admitting: Psychiatry

## 2019-08-31 ENCOUNTER — Other Ambulatory Visit: Payer: Self-pay

## 2019-08-31 ENCOUNTER — Encounter: Payer: Self-pay | Admitting: Psychiatry

## 2019-08-31 DIAGNOSIS — F17210 Nicotine dependence, cigarettes, uncomplicated: Secondary | ICD-10-CM | POA: Diagnosis present

## 2019-08-31 DIAGNOSIS — F259 Schizoaffective disorder, unspecified: Principal | ICD-10-CM | POA: Diagnosis present

## 2019-08-31 DIAGNOSIS — Z9119 Patient's noncompliance with other medical treatment and regimen: Secondary | ICD-10-CM | POA: Diagnosis not present

## 2019-08-31 DIAGNOSIS — F1210 Cannabis abuse, uncomplicated: Secondary | ICD-10-CM | POA: Diagnosis present

## 2019-08-31 DIAGNOSIS — F250 Schizoaffective disorder, bipolar type: Secondary | ICD-10-CM | POA: Diagnosis not present

## 2019-08-31 DIAGNOSIS — F79 Unspecified intellectual disabilities: Secondary | ICD-10-CM | POA: Diagnosis present

## 2019-08-31 DIAGNOSIS — Z79899 Other long term (current) drug therapy: Secondary | ICD-10-CM

## 2019-08-31 MED ORDER — HALOPERIDOL LACTATE 5 MG/ML IJ SOLN: 5.0000 mg | Freq: Two times a day (BID) | INTRAMUSCULAR | Status: DC

## 2019-08-31 MED ORDER — ACETAMINOPHEN 325 MG PO TABS: 650.0000 mg | ORAL_TABLET | Freq: Four times a day (QID) | ORAL | Status: DC | PRN

## 2019-08-31 MED ORDER — MAGNESIUM HYDROXIDE 400 MG/5ML PO SUSP: 30.0000 mL | Freq: Every day | ORAL | Status: DC | PRN

## 2019-08-31 MED ORDER — HALOPERIDOL 5 MG PO TABS
10.0000 mg | ORAL_TABLET | Freq: Two times a day (BID) | ORAL | Status: DC
  Administered 2019-08-31 – 2019-09-07 (×14): 10 mg via ORAL
  Filled 2019-08-31 (×14): qty 2

## 2019-08-31 MED ORDER — DIPHENHYDRAMINE HCL 50 MG/ML IJ SOLN
50.0000 mg | Freq: Two times a day (BID) | INTRAMUSCULAR | Status: DC
  Filled 2019-08-31: qty 1

## 2019-08-31 MED ORDER — ADULT MULTIVITAMIN W/MINERALS CH
1.0000 | ORAL_TABLET | Freq: Every day | ORAL | Status: DC
  Administered 2019-08-31 – 2019-09-07 (×8): 1 via ORAL
  Filled 2019-08-31 (×8): qty 1

## 2019-08-31 MED ORDER — BENZTROPINE MESYLATE 1 MG PO TABS
1.0000 mg | ORAL_TABLET | Freq: Every day | ORAL | Status: DC
  Administered 2019-09-01 – 2019-09-07 (×7): 1 mg via ORAL
  Filled 2019-08-31 (×7): qty 1

## 2019-08-31 MED ORDER — DIPHENHYDRAMINE HCL 25 MG PO CAPS
50.0000 mg | ORAL_CAPSULE | Freq: Two times a day (BID) | ORAL | Status: DC
  Administered 2019-08-31 – 2019-09-07 (×14): 50 mg via ORAL
  Filled 2019-08-31 (×15): qty 2

## 2019-08-31 MED ORDER — ALUM & MAG HYDROXIDE-SIMETH 200-200-20 MG/5ML PO SUSP: 30.0000 mL | ORAL | Status: DC | PRN

## 2019-08-31 NOTE — Tx Team (Signed)
Initial Treatment Plan 08/31/2019 7:04 PM [name] ZTI:[id number]    PATIENT STRESSORS: Marital or family conflict Medication change or noncompliance   PATIENT STRENGTHS: Communication skills Supportive family/friends Work skills   PATIENT IDENTIFIED PROBLEMS: Psychosis  Anxiety                   DISCHARGE CRITERIA:  Ability to meet basic life and health needs Improved stabilization in mood, thinking, and/or behavior Need for constant or close observation no longer present Reduction of life-threatening or endangering symptoms to within safe limits  PRELIMINARY DISCHARGE PLAN: Outpatient therapy Return to previous living arrangement Return to previous work or school arrangements  PATIENT/FAMILY INVOLVEMENT: This treatment plan has been presented to and reviewed with the patient, Jenna Bell. The patient has been given the opportunity to ask questions and make suggestions.  Anjolie Majer, RN 08/31/2019, 7:04 PM

## 2019-08-31 NOTE — ED Notes (Signed)
Hourly rounding reveals patient in room talking to unseen individuals. No complaints, stable, in no acute distress. Q15 minute rounds and monitoring via Security Cameras to continue. 

## 2019-08-31 NOTE — ED Notes (Signed)
Hourly rounding reveals patient sleeping in room. No complaints, stable, in no acute distress. Q15 minute rounds and monitoring via Security Cameras to continue. 

## 2019-08-31 NOTE — ED Notes (Signed)
Hourly rounding reveals patient awake in room responding to internal stimuli. No complaints, stable, in no acute distress. Q15 minute rounds and monitoring via [employer] to continue.

## 2019-08-31 NOTE — ED Notes (Signed)
Hourly rounding reveals patient awake in room. No complaints, stable, in no acute distress. Q15 minute rounds and monitoring via Security Cameras to continue. 

## 2019-08-31 NOTE — ED Notes (Signed)

## 2019-08-31 NOTE — ED Notes (Signed)
ED BHU  Is the patient under IVC or is there intent for IVC: Yes.   Is the patient medically cleared: Yes.   Is there vacancy in the ED BHU: Yes.   Is the population mix appropriate for patient: Yes.   Is the patient awaiting placement in inpatient or outpatient setting: Yes.  Has the patient had a psychiatric consult: consult pending   Survey of unit performed for contraband, proper placement and condition of furniture, tampering with fixtures in bathroom, shower, and each patient room: Yes.  ; Findings:  APPEARANCE/BEHAVIOR Calm and cooperative NEURO ASSESSMENT Orientation: oriented x3  Denies pain Hallucinations she is talking out loud to herself - she is responding to internal stimuli  Speech: Normal Gait: normal RESPIRATORY ASSESSMENT Even  Unlabored respirations  CARDIOVASCULAR ASSESSMENT Pulses equal   regular rate  Skin warm and dry   GASTROINTESTINAL ASSESSMENT no GI complaint EXTREMITIES Full ROM  PLAN OF CARE Provide calm/safe environment. Vital signs assessed twice daily. ED BHU Assessment once each 12-hour shift.  Assure the ED provider has rounded once each shift. Provide and encourage hygiene. Provide redirection as needed. Assess for escalating behavior; address immediately and inform ED provider.  Assess family dynamic and appropriateness for visitation as needed: Yes.  ; If necessary, describe findings:  Educate the patient/family about BHU procedures/visitation: Yes.  ; If necessary, describe findings:

## 2019-08-31 NOTE — ED Notes (Signed)
BEHAVIORAL HEALTH ROUNDING Patient sleeping: Yes.   Patient alert and oriented: eyes closed  Appears asleep Behavior appropriate: Yes.  ; If no, describe:  Nutrition and fluids offered: Yes  Toileting and hygiene offered: sleeping Sitter present: q 15 minute observations and security camera monitoring  

## 2019-08-31 NOTE — ED Notes (Signed)
BEHAVIORAL HEALTH ROUNDING Patient sleeping: No. Patient alert and oriented: yes Behavior appropriate: Yes.  ; If no, describe:  Nutrition and fluids offered: yes Toileting and hygiene offered: Yes  Sitter present: q15 minute observations and security camera monitoring   

## 2019-08-31 NOTE — ED Notes (Signed)
Pt observed lying in bed - swinging her arms in the air at times  - she can be heard talking to herself  - verbalizes to herself that she is about to mess some stuff up   I knocked and entered her room - I introduced myself to her  She responded appropriately  She denies pain

## 2019-08-31 NOTE — ED Notes (Addendum)
n

## 2019-08-31 NOTE — Plan of Care (Signed)
New admission.  Problem: Education: Goal: Knowledge of [facility] Education information/materials will improve Outcome: Not Progressing Goal: Emotional status will improve Outcome: Not Progressing Goal: Mental status will improve Outcome: Not Progressing Goal: Verbalization of understanding the information provided will improve Outcome: Not Progressing   Problem: Safety: Goal: Periods of time without injury will increase Outcome: Not Progressing   Problem: Education: Goal: Will be free of psychotic symptoms Outcome: Not Progressing   Problem: Coping: Goal: Coping ability will improve Outcome: Not Progressing Goal: Will verbalize feelings Outcome: Not Progressing   Problem: Health Behavior/Discharge Planning: Goal: Compliance with prescribed medication regimen will improve Outcome: Not Progressing   Problem: Self-Concept: Goal: Level of anxiety will decrease Outcome: Not Progressing

## 2019-08-31 NOTE — ED Provider Notes (Signed)
-----------------------------------------   1:08 PM on 08/31/2019 -----------------------------------------  Blood pressure 116/75, pulse 87, temperature 98 F (36.7 C), temperature source Oral, resp. rate 18, height 5\' 7"  (1.702 m), weight 54.4 kg, last menstrual period 08/28/2019, SpO2 99 %, unknown if currently breastfeeding.  The patient is calm and cooperative at this time.  There have been no acute events since the last update.  Awaiting disposition plan from Behavioral Medicine team.   08/30/2019, MD 08/31/19 1308

## 2019-08-31 NOTE — ED Notes (Signed)
Hourly rounding reveals patient in room talking to unseen individuals. No complaints, stable, in no acute distress. Q15 minute rounds and monitoring via [employer] to continue.

## 2019-08-31 NOTE — BH Assessment (Signed)
Patient is to be admitted to [facility] BMU by Dr. Harvin Hazel.  Attending Physician will be Dr. Toni Amend.   Patient has been assigned to room 315, by [facility] Charge Nurse Demetria.
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
## 2019-09-01 DIAGNOSIS — F1210 Cannabis abuse, uncomplicated: Secondary | ICD-10-CM

## 2019-09-01 LAB — HEPATIC FUNCTION PANEL
ALT: 29 U/L (ref 0–44)
AST: 30 U/L (ref 15–41)
Albumin: 3.6 g/dL (ref 3.5–5.0)
Alkaline Phosphatase: 69 U/L (ref 38–126)
Bilirubin, Direct: <0.1 mg/dL (ref 0.0–0.2)
Total Bilirubin: 0.5 mg/dL (ref 0.3–1.2)
Total Protein: 8.2 g/dL — ABNORMAL HIGH (ref 6.5–8.1)

## 2019-09-01 LAB — LIPID PANEL
Cholesterol: 179 mg/dL (ref 0–200)
HDL: 31 mg/dL — ABNORMAL LOW (ref >40)
LDL Cholesterol: 132 mg/dL — ABNORMAL HIGH (ref 0–99)
Total CHOL/HDL Ratio: 5.8 RATIO
Triglycerides: 81 mg/dL (ref <150)
VLDL: 16 mg/dL (ref 0–40)

## 2019-09-01 LAB — TSH: TSH: 3.813 u[IU]/mL (ref 0.350–4.500)

## 2019-09-01 MED ORDER — ARIPIPRAZOLE ER 400 MG IM SRER
400.0000 mg | INTRAMUSCULAR | Status: DC
  Administered 2019-09-01: 14:00:00 400 mg via INTRAMUSCULAR
  Filled 2019-09-01: qty 2

## 2019-09-01 MED ORDER — DIVALPROEX SODIUM 500 MG PO DR TAB
500.0000 mg | DELAYED_RELEASE_TABLET | Freq: Two times a day (BID) | ORAL | Status: DC
  Administered 2019-09-01 – 2019-09-07 (×13): 500 mg via ORAL
  Filled 2019-09-01 (×13): qty 1

## 2019-09-01 NOTE — Progress Notes (Addendum)
Patient has been in room awake, talking to self, preoccupied. Guarded upon approach and states "I am fine, just go...". Patient has no aggressive behaviors. Went to the dayroom for breakfast. No additional concern. Patient's safety maintained per routine precautions.

## 2019-09-01 NOTE — Plan of Care (Signed)
Patient knowledgeable of information received , able to verbalize understanding . Emotional and mental status improving . Voice no concerns around safety . Thought process remain altered . Denies Auditory Hallucinations . Patient working on  Pharmacologist anxiety and problem solving  Problem: Education: Goal: Knowledge of [facility] Education information/materials will improve 09/01/2019 1736 by Crist Infante, RN Outcome: Progressing 09/01/2019 1736 by Crist Infante, RN Outcome: Progressing Goal: Emotional status will improve 09/01/2019 1736 by Crist Infante, RN Outcome: Progressing 09/01/2019 1736 by Crist Infante, RN Outcome: Progressing Goal: Mental status will improve 09/01/2019 1736 by Crist Infante, RN Outcome: Progressing 09/01/2019 1736 by Crist Infante, RN Outcome: Progressing Goal: Verbalization of understanding the information provided will improve 09/01/2019 1736 by Crist Infante, RN Outcome: Progressing 09/01/2019 1736 by Crist Infante, RN Outcome: Progressing   Problem: Safety: Goal: Periods of time without injury will increase 09/01/2019 1736 by Crist Infante, RN Outcome: Progressing 09/01/2019 1736 by Crist Infante, RN Outcome: Progressing   Problem: Safety: Goal: Periods of time without injury will increase 09/01/2019 1736 by Crist Infante, RN Outcome: Progressing 09/01/2019 1736 by Crist Infante, RN Outcome: Progressing   Problem: Education: Goal: Will be free of psychotic symptoms 09/01/2019 1736 by Crist Infante, RN Outcome: Progressing 09/01/2019 1736 by Crist Infante, RN Outcome: Progressing   Problem: Coping: Goal: Coping ability will improve 09/01/2019 1736 by Crist Infante, RN Outcome: Progressing 09/01/2019 1736 by Crist Infante, RN Outcome: Progressing Goal: Will verbalize feelings 09/01/2019 1736 by Crist Infante, RN Outcome: Progressing 09/01/2019 1736 by Crist Infante, RN Outcome: Progressing
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
## 2019-09-01 NOTE — BHH Counselor (Signed)
LCSW Group Therapy Note  09/01/2019 1:00 PM  Type of Therapy/Topic:  Group Therapy:  Feelings about Diagnosis  Participation Level:  Did Not Attend   Description of Group:   This group will allow patients to explore their thoughts and feelings about diagnoses they have received. Patients will be guided to explore their level of understanding and acceptance of these diagnoses. Facilitator will encourage patients to process their thoughts and feelings about the reactions of others to their diagnosis and will guide patients in identifying ways to discuss their diagnosis with significant others in their lives. This group will be process-oriented, with patients participating in exploration of their own experiences, giving and receiving support, and processing challenge from other group members.   Therapeutic Goals: 1. Patient will demonstrate understanding of diagnosis as evidenced by identifying two or more symptoms of the disorder 2. Patient will be able to express two feelings regarding the diagnosis 3. Patient will demonstrate their ability to communicate their needs through discussion and/or role play  Summary of Patient Progress: X  Therapeutic Modalities:   Cognitive Behavioral Therapy Brief Therapy Feelings Identification   Penni Homans, MSW, LCSW 09/01/2019 2:13 PM
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
## 2019-09-01 NOTE — Progress Notes (Signed)
D: Patient stated slept good last night .Stated appetite  good and energy level normal. Stated concentration is good . Stated on Depression scale 0 , hopeless 10 and anxiety 0 .( low 0-10 high) Denies suicidal  homicidal ideations  .  No auditory hallucinations  No pain concerns . Appropriate ADL'S. Limited  Interacting with peers and staff.  A: Encourage patient participation with unit programming . Instruction  Given on  Medication , verbalize understanding. Voice of her goal  Take a day at a time  Compliant  With medication this shift  Isolates to room  R: Voice no other concerns. Staff continue to monitor

## 2019-09-01 NOTE — Plan of Care (Signed)
D- Patient alert and oriented. Patient presents in a pleasant mood on assessment stating that she had pretty good day today and had no complaints to voice to this Clinical research associate. Patient denies signs/symptoms of depression/anxiety, reporting that her anxiety "goes up and down". Patient also denies SI, HI, AVH, and pain at this time, although she was observed talking to herself while sitting outside of the medication room.  A- Scheduled medications administered to patient, per MD orders. Support and encouragement provided.  Routine safety checks conducted every 15 minutes.  Patient informed to notify staff with problems or concerns.  R- No adverse drug reactions noted. Patient contracts for safety at this time. Patient compliant with medications and treatment plan. Patient receptive, calm, and cooperative. Patient interacts well with others on the unit.  Patient remains safe at this time.  Problem: Education: Goal: Knowledge of [facility] Education information/materials will improve Outcome: Progressing Goal: Emotional status will improve Outcome: Progressing Goal: Mental status will improve Outcome: Progressing Goal: Verbalization of understanding the information provided will improve Outcome: Progressing   Problem: Safety: Goal: Periods of time without injury will increase Outcome: Progressing   Problem: Education: Goal: Will be free of psychotic symptoms Outcome: Progressing   Problem: Coping: Goal: Coping ability will improve Outcome: Progressing Goal: Will verbalize feelings Outcome: Progressing   Problem: Health Behavior/Discharge Planning: Goal: Compliance with prescribed medication regimen will improve Outcome: Progressing   Problem: Self-Concept: Goal: Level of anxiety will decrease Outcome: Progressing
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
## 2019-09-01 NOTE — Plan of Care (Signed)
Cooperative and pleasant. Stayed in room reporting that she needed to rest. No major concern. Support offered and safety maintained.

## 2019-09-01 NOTE — Progress Notes (Signed)
Recreation Therapy Notes  INPATIENT RECREATION THERAPY ASSESSMENT  Patient Details Name: Jenna Bell MRN: [id number] DOB: 1986/05/15 Today's Date: 09/01/2019       Information Obtained From: Patient  Able to Participate in Assessment/Interview: Yes  Patient Presentation: Responsive  Reason for Admission (Per Patient): Active Symptoms, Med Non-Compliance  Patient Stressors:    Coping Skills:   Write, Read  Leisure Interests (2+):  Music - Listen  Frequency of Recreation/Participation: Weekly  Awareness of Community Resources:     [employer]:     Current Use:    If no, Barriers?:    Expressed Interest in [employer] Information:    [place] of Residence:  [place]  Patient Main Form of Transportation: Car  Patient Strengths:  Friendly  Patient Identified Areas of Improvement:  My attitude  Patient Goal for Hospitalization:  To get better  Current SI (including self-harm):  No  Current HI:  No  Current AVH: No  Staff Intervention Plan: Group Attendance, Collaborate with Interdisciplinary Treatment Team  Consent to Intern Participation: N/A  Levorn Oleski 09/01/2019, 2:41 PM

## 2019-09-01 NOTE — Progress Notes (Signed)
Recreation Therapy Notes  Date: 09/01/2019  Time: 9:30 am   Location: Craft room   Behavioral response: N/A   Intervention Topic: Coping Skills   Discussion/Intervention: Patient did not attend group.   Clinical Observations/Feedback:  Patient did not attend group.   [name] LRT/CTRS       Jenna Bell 09/01/2019 12:26 PM 
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
## 2019-09-01 NOTE — Tx Team (Addendum)
Interdisciplinary Treatment and Diagnostic Plan Update  09/01/2019 Time of Session: 900am Jenna Bell MRN: [id number]  Principal Diagnosis: <principal problem not specified>  Secondary Diagnoses: Active Problems:   Schizoaffective disorder ([place])   Current Medications:  Current Facility-Administered Medications  Medication Dose Route Frequency Provider Last Rate Last Admin  . acetaminophen (TYLENOL) tablet 650 mg  650 mg Oral Q6H PRN Cristofano, Dorene Ar, MD      . alum & mag hydroxide-simeth (MAALOX/MYLANTA) 200-200-20 MG/5ML suspension 30 mL  30 mL Oral Q4H PRN Cristofano, Paul A, MD      . benztropine (COGENTIN) tablet 1 mg  1 mg Oral Daily Cristofano, Dorene Ar, MD   1 mg at 09/01/19 0818  . diphenhydrAMINE (BENADRYL) capsule 50 mg  50 mg Oral BID Cristofano, Dorene Ar, MD   50 mg at 09/01/19 0818   Or  . diphenhydrAMINE (BENADRYL) injection 50 mg  50 mg Intramuscular BID Cristofano, Paul A, MD      . haloperidol (HALDOL) tablet 10 mg  10 mg Oral BID Cristofano, Dorene Ar, MD   10 mg at 09/01/19 0818   Or  . haloperidol lactate (HALDOL) injection 5 mg  5 mg Intramuscular BID Cristofano, Paul A, MD      . magnesium hydroxide (MILK OF MAGNESIA) suspension 30 mL  30 mL Oral Daily PRN Cristofano, Dorene Ar, MD      . multivitamin with minerals tablet 1 tablet  1 tablet Oral Daily Cristofano, Dorene Ar, MD   1 tablet at 09/01/19 4665   PTA Medications: Medications Prior to Admission  Medication Sig Dispense Refill Last Dose  . ARIPiprazole ER (ABILIFY MAINTENA) 400 MG SRER injection Inject 2 mLs (400 mg total) into the muscle every 28 (twenty-eight) days. Next injection on 03/21/2018 1 each 1   . divalproex (DEPAKOTE) 500 MG DR tablet Take 1 tablet (500 mg total) by mouth every 12 (twelve) hours. 60 tablet 1   . EPINEPHrine (EPIPEN 2-PAK) 0.3 mg/0.3 mL IJ SOAJ injection Inject 0.3 mLs (0.3 mg total) into the muscle as needed for anaphylaxis. 1 each 0   . haloperidol (HALDOL) 10 MG tablet Take  0.5 tablets (5 mg total) by mouth at bedtime. 30 tablet 1   . haloperidol decanoate (HALDOL DECANOATE) 100 MG/ML injection Inject 1 mL (100 mg total) into the muscle every 28 (twenty-eight) days. Next injection on 04/04/2018 1 mL 1   . Multiple Vitamin (MULTIVITAMIN WITH MINERALS) TABS tablet Take 1 tablet by mouth daily. 30 tablet 1   . vitamin B-12 1000 MCG tablet Take 1 tablet (1,000 mcg total) by mouth daily. 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   Physician Treatment Plan for Primary Diagnosis: <principal problem not specified> Long Term Goal(s):     Short Term Goals:    Medication Management: Evaluate patient's response, side effects, and tolerance of medication regimen.  Therapeutic Interventions: 1 to 1 sessions, Unit Group sessions and Medication administration.  Evaluation of Outcomes: Not Met  Physician Treatment Plan for Secondary Diagnosis: Active Problems:   Schizoaffective disorder ([place])  Long Term Goal(s):     Short Term Goals:       Medication Management: Evaluate patient's response, side effects, and tolerance of medication regimen.  Therapeutic Interventions: 1 to 1 sessions, Unit  Group sessions and Medication administration.  Evaluation of Outcomes: Not Met   RN Treatment Plan for Primary Diagnosis: <principal problem not specified> Long Term Goal(s): Knowledge of disease and therapeutic regimen to maintain health will improve  Short Term Goals: Ability to demonstrate self-control, Ability to participate in decision making will improve, Ability to verbalize feelings will improve, Ability to identify and develop effective coping behaviors will improve and Compliance with  prescribed medications will improve  Medication Management: RN will administer medications as ordered by provider, will assess and evaluate patient's response and provide education to patient for prescribed medication. RN will report any adverse and/or side effects to prescribing provider.  Therapeutic Interventions: 1 on 1 counseling sessions, Psychoeducation, Medication administration, Evaluate responses to treatment, Monitor vital signs and CBGs as ordered, Perform/monitor CIWA, COWS, AIMS and Fall Risk screenings as ordered, Perform wound care treatments as ordered.  Evaluation of Outcomes: Not Met   LCSW Treatment Plan for Primary Diagnosis: <principal problem not specified> Long Term Goal(s): Safe transition to appropriate next level of care at discharge, Engage patient in therapeutic group addressing interpersonal concerns.  Short Term Goals: Engage patient in aftercare planning with referrals and resources, Increase ability to appropriately verbalize feelings, Increase emotional regulation, Facilitate acceptance of mental health diagnosis and concerns and Increase skills for wellness and recovery  Therapeutic Interventions: Assess for all discharge needs, 1 to 1 time with Social worker, Explore available resources and support systems, Assess for adequacy in community support network, Educate family and significant other(s) on suicide prevention, Complete Psychosocial Assessment, Interpersonal group therapy.  Evaluation of Outcomes: Not Met   Progress in Treatment: Attending groups: No. Participating in groups: No. Taking medication as prescribed: Yes. Toleration medication: Yes. Family/Significant other contact made: No, will contact:  when consent given Patient understands diagnosis: No. Discussing patient identified problems/goals with staff: Yes. Medical problems stabilized or resolved: Yes. Denies suicidal/homicidal ideation: Yes. Issues/concerns per patient self-inventory:  No. Other: N/A  New problem(s) identified: No, Describe:  none  New Short Term/Long Term Goal(s): Detox, elimination of AVH/symptoms of psychosis, medication management for mood stabilization; elimination of SI thoughts; development of comprehensive mental wellness/sobriety plan.   Patient Goals:  "Get better and get on"  Discharge Plan or Barriers: SPE pamphlet, Mobile Crisis information, and AA/NA information provided to patient for additional community support and resources. CSW assessing for appropriate referrals.  Reason for Continuation of Hospitalization: Aggression Mania Medication stabilization  Estimated Length of Stay: 5-7 days  Recreational Therapy: Patient: N/A Patient Goal: Patient will engage in groups without prompting or encouragement from LRT x3 group sessions within 5 recreation therapy group sessions.  Attendees: Patient: Tauna Macfarlane 09/01/2019 10:14 AM  Physician: Dr Weber Cooks MD 09/01/2019 10:14 AM  Nursing:  09/01/2019 10:14 AM  RN Care Manager: 09/01/2019 10:14 AM  Social Worker: Minette Brine Moton LCSW 09/01/2019 10:14 AM  Recreational Therapist: Roanna Epley CTRS LRT 09/01/2019 10:14 AM  Other: Sanjuana Kava LCSW 09/01/2019 10:14 AM  Other: Assunta Curtis LCSW 09/01/2019 10:14 AM  Other: 09/01/2019 10:14 AM    Scribe for Treatment Team: Mariann Laster Moton, LCSW 09/01/2019 10:14 AM

## 2019-09-02 NOTE — Plan of Care (Signed)
Pt denies depression, anxiety, SI, HI and AVH. Pt rates hopelessness at 10/10.  Pt was educated on care plan and verbalizes understanding. Torrie Mayers RN Problem: Education: Goal: Knowledge of [facility] Education information/materials will improve Outcome: Progressing Goal: Emotional status will improve Outcome: Progressing Goal: Mental status will improve Outcome: Progressing Goal: Verbalization of understanding the information provided will improve Outcome: Progressing   Problem: Safety: Goal: Periods of time without injury will increase Outcome: Progressing   Problem: Education: Goal: Will be free of psychotic symptoms Outcome: Progressing   Problem: Coping: Goal: Coping ability will improve Outcome: Progressing Goal: Will verbalize feelings Outcome: Progressing   Problem: Health Behavior/Discharge Planning: Goal: Compliance with prescribed medication regimen will improve Outcome: Progressing   Problem: Self-Concept: Goal: Level of anxiety will decrease Outcome: Progressing

## 2019-09-02 NOTE — Plan of Care (Signed)
Patient has been in the milieu. Cooperative and denying self harm thoughts. Patient was observed talking to self. Has stayed in the dayroom with peers. Disheveled with poor hygiene and body odor. Refused to perform hygiene. Patient had a snack, took medications and did not have any concern. Encouragements provided and safety maintained.
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
## 2019-09-02 NOTE — Progress Notes (Signed)
Recreation Therapy Notes   Date: 09/02/2019  Time: 9:30 am  Location: Craft room  Behavioral response: Appropriate  Intervention Topic: Decision Making   Discussion/Intervention:  Group content today was focused on Decision making. The group defined decision making and some positive ways they make decisions for themselves. Individuals expressed reasons why they neglected any decision making in the past. Patients described ways to improve decision making skills in the future. The group explained what could happen if they did not do any decision making at all. Participants express how bad decision has affected them and others around them. Individual explained the importance of decision making. The group participated in the intervention "Making decisions" where they had a chance to discover some of their weaknesses and strengths in decision making. Patient came up with a new decision-making skill to improve themselves in the future.  Clinical Observations/Feedback:  Patient came to group and defined decision making as a solution. She stated that distractions keep her from making great decision. Individual was social with peers and staff while participating in the intervention during group.  Miloh Alcocer LRT/CTRS         Jamarie Joplin 09/02/2019 11:37 AM

## 2019-09-02 NOTE — [facility] Group Notes (Signed)
LCSW Group Therapy Note  09/02/2019 11:32 AM  Type of Therapy/Topic:  Group Therapy:  Emotion Regulation  Participation Level:  Minimal   Description of Group:   The purpose of this group is to assist patients in learning to regulate negative emotions and experience positive emotions. Patients will be guided to discuss ways in which they have been vulnerable to their negative emotions. These vulnerabilities will be juxtaposed with experiences of positive emotions or situations, and patients will be challenged to use positive emotions to combat negative ones. Special emphasis will be placed on coping with negative emotions in conflict situations, and patients will process healthy conflict resolution skills.  Therapeutic Goals: 1. Patient will identify two positive emotions or experiences to reflect on in order to balance out negative emotions 2. Patient will label two or more emotions that they find the most difficult to experience 3. Patient will demonstrate positive conflict resolution skills through discussion and/or role plays  Summary of Patient Progress: Pt was present in group and participated a little bit throughout the group session. Pt appeared to be responding to some internal stimuli as she would randomly start talking in a low tone to herself.   Therapeutic Modalities:   Cognitive Behavioral Therapy Feelings Identification Dialectical Behavioral Therapy   Iris Pert, MSW, LCSW Clinical Social Work 09/02/2019 11:32 AM

## 2019-09-03 NOTE — [facility] Group Notes (Signed)
Balance In Life 09/03/2019 1PM  Type of Therapy/Topic:  Group Therapy:  Balance in Life  Participation Level:  None  Description of Group:   This group will address the concept of balance and how it feels and looks when one is unbalanced. Patients will be encouraged to process areas in their lives that are out of balance and identify reasons for remaining unbalanced. Facilitators will guide patients in utilizing problem-solving interventions to address and correct the stressor making their life unbalanced. Understanding and applying boundaries will be explored and addressed for obtaining and maintaining a balanced life. Patients will be encouraged to explore ways to assertively make their unbalanced needs known to significant others in their lives, using other group members and facilitator for support and feedback.  Therapeutic Goals: 1. Patient will identify two or more emotions or situations they have that consume much of in their lives. 2. Patient will identify signs/triggers that life has become out of balance:  3. Patient will identify two ways to set boundaries in order to achieve balance in their lives:  4. Patient will demonstrate ability to communicate their needs through discussion and/or role plays  Summary of Patient Progress: Patient responded to internal stimuli during session. No participation or input provided.   Therapeutic Modalities:   Cognitive Behavioral Therapy Solution-Focused Therapy Assertiveness Training  Jacqueleen Pulver Philip Aspen, LCSW

## 2019-09-03 NOTE — Plan of Care (Signed)
Pt rates depression 10/10 and anxiety 9/10. Pt denies SI, HI and AVH. Pt was educated on care plan and verbalizes understanding. Torrie Mayers RN Problem: Education: Goal: Knowledge of [place] [facility] Education information/materials will improve Outcome: Progressing Goal: Emotional status will improve Outcome: Progressing Goal: Mental status will improve Outcome: Progressing Goal: Verbalization of understanding the information provided will improve Outcome: Progressing   Problem: Safety: Goal: Periods of time without injury will increase Outcome: Progressing   Problem: Education: Goal: Will be free of psychotic symptoms Outcome: Progressing   Problem: Coping: Goal: Coping ability will improve Outcome: Progressing Goal: Will verbalize feelings Outcome: Progressing   Problem: Health Behavior/Discharge Planning: Goal: Compliance with prescribed medication regimen will improve Outcome: Progressing   Problem: Self-Concept: Goal: Level of anxiety will decrease Outcome: Progressing  Pt rats

## 2019-09-03 NOTE — Progress Notes (Signed)
Recreation Therapy Notes   Date: 09/03/2019  Time: 9:30 am  Location: Craft room  Behavioral response: Appropriate,responding  to internal stimuli  Intervention Topic: Relaxation   Discussion/Intervention:  Group content today was focused on relaxation. The group defined relaxation and identified healthy ways to relax. Individuals expressed how much time they spend relaxing. Patients expressed how much their life would be if they did not make time for themselves to relax. The group stated ways they could improve their relaxation techniques in the future.  Individuals participated in the intervention "Time to Relax" where they had a chance to experience different relaxation techniques.  Clinical Observations/Feedback:  Patient came to group and was focused on what peers and staff had to say about relaxation. Individual began laughing to herself and respond to internal stimuli during the group activity.  Jenna Bell LRT/CTRS         Pragya Lofaso 09/03/2019 12:32 PM

## 2019-09-03 NOTE — Progress Notes (Signed)
Pt sates she feels "much better.... a little sluggish this morning but better". Pt went to two groups today and has plans to take a shower tonight when I suggested it as a goal. Torrie Mayers RN

## 2019-09-03 NOTE — Progress Notes (Signed)
[facility] MD Progress Note  09/03/2019 12:24 PM Jenna Bell  MRN:  [id number] Subjective: Follow-up for this 34 year old woman with a history of psychosis and intellectual disability.  Patient seen chart reviewed.  Patient continues to be treated with Depakote and Haldol orally and she did receive her Abilify shot on the 26th.  On interview the patient's only complaint is wanting to be discharged.  He denies hallucinations.  I tried to review again with her the events leading to hospitalization and she again denied having threatened anyone.  On the unit there seems to be some sign that at times she is preoccupied and might respond to internal stimuli but has not been aggressive or threatening and other than poor hygiene and is basically taking care of her self.  Insight remains poor.  No sign of any acute medicine side effects. Principal Problem: Schizoaffective disorder ([place]) Diagnosis: Principal Problem:   Schizoaffective disorder ([place]) Active Problems:   Cannabis abuse  Total Time spent with patient: 30 minutes  Past Psychiatric History: Patient has a history of prior hospitalizations for very similar circumstances  Past Medical History:  Past Medical History:  Diagnosis Date  . Bipolar disorder ([place])    History reviewed. No pertinent surgical history. Family History: History reviewed. No pertinent family history. Family Psychiatric  History: See previous Social History:  Social History   Substance and Sexual Activity  Alcohol Use Not Currently     Social History   Substance and Sexual Activity  Drug Use Yes  . Types: Methamphetamines    Social History   Socioeconomic History  . Marital status: Single    Spouse name: Not on file  . Number of children: Not on file  . Years of education: Not on file  . Highest education level: Not on file  Occupational History  . Not on file  Tobacco Use  . Smoking status: Current Every Day Smoker    Packs/day: 0.25    Types:  Cigarettes  . Smokeless tobacco: Never Used  Substance and Sexual Activity  . Alcohol use: Not Currently  . Drug use: Yes    Types: Methamphetamines  . Sexual activity: Yes  Other Topics Concern  . Not on file  Social History Narrative  . Not on file   Social Determinants of Health   Financial Resource Strain:   . Difficulty of Paying Living Expenses: Not on file  Food Insecurity:   . Worried About Charity fundraiser in the Last Year: Not on file  . Ran Out of Food in the Last Year: Not on file  Transportation Needs:   . Lack of Transportation (Medical): Not on file  . Lack of Transportation (Non-Medical): Not on file  Physical Activity:   . Days of Exercise per Week: Not on file  . Minutes of Exercise per Session: Not on file  Stress:   . Feeling of Stress : Not on file  Social Connections:   . Frequency of Communication with Friends and Family: Not on file  . Frequency of Social Gatherings with Friends and Family: Not on file  . Attends Religious Services: Not on file  . Active Member of Clubs or Organizations: Not on file  . Attends Archivist Meetings: Not on file  . Marital Status: Not on file   Additional Social History:    Pain Medications: see PTA Prescriptions: see PTA Over the Counter: see PTA  Sleep: Fair  Appetite:  Fair  Current Medications: Current Facility-Administered Medications  Medication Dose Route Frequency Provider Last Rate Last Admin  . acetaminophen (TYLENOL) tablet 650 mg  650 mg Oral Q6H PRN Cristofano, Worthy Rancher, MD      . alum & mag hydroxide-simeth (MAALOX/MYLANTA) 200-200-20 MG/5ML suspension 30 mL  30 mL Oral Q4H PRN Cristofano, Worthy Rancher, MD      . ARIPiprazole ER (ABILIFY MAINTENA) injection 400 mg  400 mg Intramuscular Q28 days Kaycee Mcgaugh, Jackquline Denmark, MD   400 mg at 09/01/19 1401  . benztropine (COGENTIN) tablet 1 mg  1 mg Oral Daily Cristofano, Worthy Rancher, MD   1 mg at 09/03/19 0807  . diphenhydrAMINE  (BENADRYL) capsule 50 mg  50 mg Oral BID Cristofano, Paul A, MD   50 mg at 09/03/19 8099   Or  . diphenhydrAMINE (BENADRYL) injection 50 mg  50 mg Intramuscular BID Cristofano, Paul A, MD      . divalproex (DEPAKOTE) DR tablet 500 mg  500 mg Oral Q12H Lennis Korb T, MD   500 mg at 09/03/19 8338  . haloperidol (HALDOL) tablet 10 mg  10 mg Oral BID Cristofano, Worthy Rancher, MD   10 mg at 09/03/19 2505   Or  . haloperidol lactate (HALDOL) injection 5 mg  5 mg Intramuscular BID Cristofano, Paul A, MD      . magnesium hydroxide (MILK OF MAGNESIA) suspension 30 mL  30 mL Oral Daily PRN Cristofano, Worthy Rancher, MD      . multivitamin with minerals tablet 1 tablet  1 tablet Oral Daily Cristofano, Worthy Rancher, MD   1 tablet at 09/03/19 3976    Lab Results: No results found for this or any previous visit (from the past 48 hour(s)).  Blood Alcohol level:  Lab Results  Component Value Date   ETH <10 08/29/2019   ETH 13 (H) 03/16/2018    Metabolic Disorder Labs: Lab Results  Component Value Date   HGBA1C 5.4 02/12/2018   MPG 108 02/12/2018   No results found for: PROLACTIN Lab Results  Component Value Date   CHOL 179 09/01/2019   TRIG 81 09/01/2019   HDL 31 (L) 09/01/2019   CHOLHDL 5.8 09/01/2019   VLDL 16 09/01/2019   LDLCALC 132 (H) 09/01/2019   LDLCALC 111 (H) 02/12/2018    Physical Findings: AIMS:  , ,  ,  ,    CIWA:    COWS:     Musculoskeletal: Strength & Muscle Tone: within normal limits Gait & Station: normal Patient leans: N/A  Psychiatric Specialty Exam: Physical Exam  Nursing note and vitals reviewed. Constitutional: She appears well-developed and well-nourished.  HENT:  Head: Normocephalic and atraumatic.  Eyes: Pupils are equal, round, and reactive to light. Conjunctivae are normal.  Cardiovascular: Regular rhythm and normal heart sounds.  Respiratory: Effort normal. No respiratory distress.  GI: Soft.  Musculoskeletal:        General: Normal range of motion.      Cervical back: Normal range of motion.  Neurological: She is alert.  Skin: Skin is warm and dry.  Psychiatric: Her speech is normal. Her affect is not blunt. She is slowed. Cognition and memory are impaired. She expresses inappropriate judgment. She expresses no homicidal and no suicidal ideation.    Review of Systems  Constitutional: Negative.   HENT: Negative.   Eyes: Negative.   Respiratory: Negative.   Cardiovascular: Negative.   Gastrointestinal: Negative.   Musculoskeletal: Negative.   Skin: Negative.  Neurological: Negative.   Psychiatric/Behavioral: Positive for dysphoric mood.    Blood pressure 131/85, pulse 93, temperature 98.3 F (36.8 C), temperature source Oral, resp. rate 18, height 5\' 7"  (1.702 m), weight 69.4 kg, last menstrual period 08/28/2019, SpO2 99 %, unknown if currently breastfeeding.Body mass index is 23.96 kg/m.  General Appearance: Casual  Eye Contact:  Good  Speech:  Clear and Coherent  Volume:  Normal  Mood:  Euthymic  Affect:  Inappropriate  Thought Process:  Coherent  Orientation:  Full (Time, Place, and Person)  Thought Content:  Illogical, Paranoid Ideation and Rumination  Suicidal Thoughts:  No  Homicidal Thoughts:  No  Memory:  Immediate;   Fair Recent;   Fair Remote;   Fair  Judgement:  Impaired  Insight:  Shallow  Psychomotor Activity:  Normal  Concentration:  Concentration: Fair  Recall:  08/30/2019 of Knowledge:  Fair  Language:  Fair  Akathisia:  No  Handed:  Right  AIMS (if indicated):     Assets:  Desire for Improvement Physical Health Resilience  ADL's:  Impaired  Cognition:  Impaired,  Mild  Sleep:  Number of Hours: 7     Treatment Plan Summary: Daily contact with patient to assess and evaluate symptoms and progress in treatment, Medication management and Plan Patient has been cooperative with medicine.  We will check a Depakote level tomorrow morning.  Not sure exactly when she will be back to her baseline.  I will  talk with the treatment team about whether we have been in contact with her family and they have any thoughts about it.  Patient seems certain that she would have a place to stay at discharge although I do not know if that is entirely clear at this point.  [employer], MD 09/03/2019, 12:24 PM

## 2019-09-04 LAB — VALPROIC ACID LEVEL: Valproic Acid Lvl: 75 ug/mL (ref 50.0–100.0)

## 2019-09-04 MED ORDER — ARIPIPRAZOLE 10 MG PO TABS
10.0000 mg | ORAL_TABLET | Freq: Every day | ORAL | Status: DC
  Administered 2019-09-04 – 2019-09-07 (×4): 10 mg via ORAL
  Filled 2019-09-04 (×4): qty 1

## 2019-09-04 NOTE — Plan of Care (Signed)
Patient knowledgeable of information received , able to verbalize understanding . Emotional and mental status improving . Voice no concerns around safety . Thought process remain altered .  Auditory Hallucinations . Patient working on  Pharmacologist anxiety and problem solving   Problem: Safety: Goal: Periods of time without injury will increase Outcome: Progressing

## 2019-09-04 NOTE — Progress Notes (Signed)
[facility] MD Progress Note  09/04/2019 11:33 AM Jenna Bell  MRN:  [id number]   Subjective: Follow-up for this 34 year old female diagnosed with schizoaffective disorder.  Patient reports today that she has nothing to talk about.  She denies any suicidal homicidal ideations and denies any hallucinations.  Patient is confronted about her actively having auditory conversations with herself yesterday loudly in her room and patient stated "I do not know you are talking about that was not me."  Patient continues stating that she does not want to talk to me about anything and answers her questions with quick yes or no answers.  Patient does deny all symptoms today.  Patient then states yes to eating well as well as sleeping good.  Principal Problem: Schizoaffective disorder ([facility]) Diagnosis: Principal Problem:   Schizoaffective disorder ([facility]) Active Problems:   Cannabis abuse  Total Time spent with patient: 30 minutes  Past Psychiatric History: Patient has had a previous psychiatric hospitalization here in the summer 2019 and was diagnosed with schizoaffective disorder.  When she was discharged she was on long-acting Abilify as well as long-acting Haldol and Depakote.  Not clear what her current medications are.  No known past history of suicide attempts  Past Medical History:  Past Medical History:  Diagnosis Date  . Bipolar disorder ([facility])    History reviewed. No pertinent surgical history. Family History: History reviewed. No pertinent family history. Family Psychiatric  History: None reported Social History:  Social History   Substance and Sexual Activity  Alcohol Use Not Currently     Social History   Substance and Sexual Activity  Drug Use Yes  . Types: Methamphetamines    Social History   Socioeconomic History  . Marital status: Single    Spouse name: Not on file  . Number of children: Not on file  . Years of education: Not on file  . Highest education level: Not on file   Occupational History  . Not on file  Tobacco Use  . Smoking status: Current Every Day Smoker    Packs/day: 0.25    Types: Cigarettes  . Smokeless tobacco: Never Used  Substance and Sexual Activity  . Alcohol use: Not Currently  . Drug use: Yes    Types: Methamphetamines  . Sexual activity: Yes  Other Topics Concern  . Not on file  Social History Narrative  . Not on file   Social Determinants of Health   Financial Resource Strain:   . Difficulty of Paying Living Expenses: Not on file  Food Insecurity:   . Worried About Programme researcher, broadcasting/film/video in the Last Year: Not on file  . Ran Out of Food in the Last Year: Not on file  Transportation Needs:   . Lack of Transportation (Medical): Not on file  . Lack of Transportation (Non-Medical): Not on file  Physical Activity:   . Days of Exercise per Week: Not on file  . Minutes of Exercise per Session: Not on file  Stress:   . Feeling of Stress : Not on file  Social Connections:   . Frequency of Communication with Friends and Family: Not on file  . Frequency of Social Gatherings with Friends and Family: Not on file  . Attends Religious Services: Not on file  . Active Member of Clubs or Organizations: Not on file  . Attends Banker Meetings: Not on file  . Marital Status: Not on file   Additional Social History:    Pain Medications: see PTA Prescriptions:  see PTA Over the Counter: see PTA                    Sleep: Poor  Appetite:  Fair  Current Medications: Current Facility-Administered Medications  Medication Dose Route Frequency Provider Last Rate Last Admin  . acetaminophen (TYLENOL) tablet 650 mg  650 mg Oral Q6H PRN Cristofano, Dorene Ar, MD      . alum & mag hydroxide-simeth (MAALOX/MYLANTA) 200-200-20 MG/5ML suspension 30 mL  30 mL Oral Q4H PRN Cristofano, Paul A, MD      . ARIPiprazole (ABILIFY) tablet 10 mg  10 mg Oral Daily Deborha Moseley, Lowry Ram, FNP      . ARIPiprazole ER (ABILIFY MAINTENA) injection  400 mg  400 mg Intramuscular Q28 days Clapacs, Madie Reno, MD   400 mg at 09/01/19 1401  . benztropine (COGENTIN) tablet 1 mg  1 mg Oral Daily Cristofano, Dorene Ar, MD   1 mg at 09/04/19 0810  . diphenhydrAMINE (BENADRYL) capsule 50 mg  50 mg Oral BID Cristofano, Paul A, MD   50 mg at 09/04/19 0809   Or  . diphenhydrAMINE (BENADRYL) injection 50 mg  50 mg Intramuscular BID Cristofano, Paul A, MD      . divalproex (DEPAKOTE) DR tablet 500 mg  500 mg Oral Q12H Clapacs, John T, MD   500 mg at 09/04/19 0810  . haloperidol (HALDOL) tablet 10 mg  10 mg Oral BID Cristofano, Dorene Ar, MD   10 mg at 09/04/19 7564   Or  . haloperidol lactate (HALDOL) injection 5 mg  5 mg Intramuscular BID Cristofano, Paul A, MD      . magnesium hydroxide (MILK OF MAGNESIA) suspension 30 mL  30 mL Oral Daily PRN Cristofano, Dorene Ar, MD      . multivitamin with minerals tablet 1 tablet  1 tablet Oral Daily Cristofano, Dorene Ar, MD   1 tablet at 09/04/19 0809    Lab Results:  Results for orders placed or performed during the hospital encounter of 08/31/19 (from the past 48 hour(s))  Valproic acid level     Status: None   Collection Time: 09/04/19  6:39 AM  Result Value Ref Range   Valproic Acid Lvl 75 50.0 - 100.0 ug/mL    Comment: Performed at [facility], [address] [place], [place] [address]    Blood Alcohol level:  Lab Results  Component Value Date   ETH <10 08/29/2019   ETH 13 (H) 18/84/1660    Metabolic Disorder Labs: Lab Results  Component Value Date   HGBA1C 5.4 02/12/2018   MPG 108 02/12/2018   No results found for: PROLACTIN Lab Results  Component Value Date   CHOL 179 09/01/2019   TRIG 81 09/01/2019   HDL 31 (L) 09/01/2019   CHOLHDL 5.8 09/01/2019   VLDL 16 09/01/2019   LDLCALC 132 (H) 09/01/2019   LDLCALC 111 (H) 02/12/2018    Physical Findings: AIMS:  , ,  ,  ,    CIWA:    COWS:     Musculoskeletal: Strength & Muscle Tone: within normal limits Gait & Station: normal Patient  leans: N/A  Psychiatric Specialty Exam: Physical Exam  Nursing note and vitals reviewed. Constitutional: She is oriented to person, place, and time. She appears well-developed and well-nourished.  Cardiovascular: Normal rate.  Respiratory: Effort normal.  Musculoskeletal:        General: Normal range of motion.  Neurological: She is alert and oriented to person, place, and time.  Skin: Skin is warm.  Psychiatric: She is agitated, withdrawn and actively hallucinating.    Review of Systems  Constitutional: Negative.   HENT: Negative.   Eyes: Negative.   Respiratory: Negative.   Cardiovascular: Negative.   Gastrointestinal: Negative.   Genitourinary: Negative.   Musculoskeletal: Negative.   Skin: Negative.   Neurological: Negative.     Blood pressure 130/85, pulse 92, temperature 98.3 F (36.8 C), temperature source Oral, resp. rate 18, height 5\' 7"  (1.702 m), weight 69.4 kg, last menstrual period 08/28/2019, SpO2 100 %, unknown if currently breastfeeding.Body mass index is 23.96 kg/m.  General Appearance: Disheveled and Guarded  Eye Contact:  Poor  Speech:  Clear and Coherent  Volume:  Increased  Mood:  Irritable  Affect:  Constricted  Thought Process:  Linear and Descriptions of Associations: Circumstantial  Orientation:  Full (Time, Place, and Person)  Thought Content:  Hallucinations: Auditory  Suicidal Thoughts:  No  Homicidal Thoughts:  No  Memory:  Immediate;   Poor Recent;   Poor Remote;   Poor  Judgement:  Impaired  Insight:  Lacking  Psychomotor Activity:  Normal  Concentration:  Concentration: Poor  Recall:  Fair  Fund of Knowledge:  Poor  Language:  Fair  Akathisia:  No  Handed:  Right  AIMS (if indicated):     Assets:  Financial Resources/Insurance Housing Resilience  ADL's:  Intact  Cognition:  WNL  Sleep:  Number of Hours: 1.25   Assessment: Patient presents in the day room and is somewhat cooperative during the evaluation.  She does not really  want to communicate but is willing to come to the office and sit down with me.  Patient has denied everything and reports sleeping and eating well.  However, it was only documented the patient had 1.25 hours of sleep last night and I witnessed the patient in her room yesterday having a argument and conversation with herself.  Patient appeared to be actively responding to internal stimuli the patient continues to deny it.  It appears the patient may have been dealing with her symptoms long enough that she does have defensive mechanisms to try to minimize her symptoms so that she does not stay in the hospital longer.  Patient is demanding to be discharged home.  Consulted with Dr. 08/30/2019 and decided to start back oral Abilify since the patient received Abilify Maintena injection on 09/01/2019.  Treatment Plan Summary: Daily contact with patient to assess and evaluate symptoms and progress in treatment and Medication management Start Abilify 10 mg p.o. daily for schizoaffective disorder Continue Abilify maintain a 400 mg IM q. 28 days for schizoaffective disorder Continue Cogentin 1 mg p.o. daily for EPS Continue Depakote DR 500 mg p.o. every 12 hours for schizoaffective disorder Continue Benadryl 50 mg p.o. twice daily Continue Haldol 10 mg p.o. twice daily for schizoaffective disorder Encourage group therapy participation Continue every 15 minute safety checks  09/03/2019, FNP 09/04/2019, 11:33 AM

## 2019-09-04 NOTE — Plan of Care (Signed)
  Problem: Education: Goal: Will be free of psychotic symptoms 09/04/2019 0339 by Trula Ore, RN Outcome: Not Progressing 09/04/2019 0339 by Trula Ore, RN Outcome: Progressing  Patient appears responding to internal stimuli.

## 2019-09-04 NOTE — Progress Notes (Signed)
Patient alert and oriented x 4, with periods of confusion to situation, her thoughts are disorganized , she was noted restless, pacing the unit responding to internal stimuli although she demes SI/HI/AVH. Patient was offered emotional support and  Given scheduled  Medication no distress noted will continue to monitor. Marland Kitchen

## 2019-09-04 NOTE — Progress Notes (Signed)
Recreation Therapy Notes    Date: 09/04/2019  Time: 9:30 am  Location: Craft room  Behavioral response: Appropriate  Intervention Topic: Self-esteem   Discussion/Intervention:  Group content today was focused on self-esteem. Patient defined self-esteem and where it comes form. The group described reasons self-esteem is important. Individuals stated things that impact self-esteem and positive ways to improve self-esteem. The group participated in the intervention "Getting to know me" where patients were able to think of positive things that makes them who they are. Clinical Observations/Feedback:  Patient came to group and defined self-esteem as confidence. Individual was social with peers and staff while participating in the intervention during group. Participant responded to internal stimuli on and off during group.  Jenna Bell LRT/CTRS         [name] 09/04/2019 11:30 AM

## 2019-09-04 NOTE — Progress Notes (Signed)
  D :Schizoaffective  A: Hygiene remains poor . No ADL's or personal chores . Isolates to room . Continuously talking to herself at some intervals loudly in room . Compliant with medication  Patient stated slept fair  last night .Stated appetite  good and energy level  normal. Stated concentration is good . Stated on Depression scale 4 , hopeless 5  and anxiety 8 .( low 0-10 high) Denies suicidal  homicidal ideations  .  No auditory hallucinations  No pain concerns . Appropriate ADL'S. Interacting with peers and staff.  Voice of wanting to go home  And back to work  At [employer] patient participation with unit programming . Instruction  Given on  Medication , verbalize understanding.  R: Voice no other concerns. Staff continue to monitor

## 2019-09-04 NOTE — [facility] Group Notes (Signed)
LCSW Group Therapy Note  09/04/2019 1:00 PM  Type of Therapy and Topic:  Group Therapy:  Feelings around Relapse and Recovery  Participation Level:  Did Not Attend   Description of Group:    Patients in this group will discuss emotions they experience before and after a relapse. They will process how experiencing these feelings, or avoidance of experiencing them, relates to having a relapse. Facilitator will guide patients to explore emotions they have related to recovery. Patients will be encouraged to process which emotions are more powerful. They will be guided to discuss the emotional reaction significant others in their lives may have to their relapse or recovery. Patients will be assisted in exploring ways to respond to the emotions of others without this contributing to a relapse.  Therapeutic Goals: 1. Patient will identify two or more emotions that lead to a relapse for them 2. Patient will identify two emotions that result when they relapse 3. Patient will identify two emotions related to recovery 4. Patient will demonstrate ability to communicate their needs through discussion and/or role plays   Summary of Patient Progress: X  Therapeutic Modalities:   Cognitive Behavioral Therapy Solution-Focused Therapy Assertiveness Training Relapse Prevention Therapy   Penni Homans, MSW, LCSW 09/04/2019 2:37 PM

## 2019-09-05 MED ORDER — TEMAZEPAM 15 MG PO CAPS
30.0000 mg | ORAL_CAPSULE | Freq: Every day | ORAL | Status: DC
  Administered 2019-09-05 – 2019-09-06 (×2): 30 mg via ORAL
  Filled 2019-09-05 (×2): qty 2

## 2019-09-05 NOTE — Plan of Care (Signed)
Patient denies SI/HI/AVH. Patient is responding to internal stimuli. Patient is up and in milieu. Patient is adherent with scheduled medications.    Problem: Education: Goal: Knowledge of [place] [facility] Education information/materials will improve Outcome: Not Progressing Goal: Emotional status will improve Outcome: Not Progressing Goal: Mental status will improve Outcome: Not Progressing

## 2019-09-05 NOTE — Plan of Care (Signed)
  Problem: Education: Goal: Will be free of psychotic symptoms Outcome: Not Progressing  Patient responding to internal stimuli.

## 2019-09-05 NOTE — Tx Team (Signed)
Interdisciplinary Treatment and Diagnostic Plan Update  09/05/2019 Time of Session: 900am Jenna Bell MRN: [id number]  Principal Diagnosis: Schizoaffective disorder [facility])  Secondary Diagnoses: Principal Problem:   Schizoaffective disorder ([place]) Active Problems:   Cannabis abuse   Current Medications:  Current Facility-Administered Medications  Medication Dose Route Frequency Provider Last Rate Last Admin  . acetaminophen (TYLENOL) tablet 650 mg  650 mg Oral Q6H PRN Cristofano, Dorene Ar, MD      . alum & mag hydroxide-simeth (MAALOX/MYLANTA) 200-200-20 MG/5ML suspension 30 mL  30 mL Oral Q4H PRN Cristofano, Paul A, MD      . ARIPiprazole (ABILIFY) tablet 10 mg  10 mg Oral Daily Money, Lowry Ram, FNP   10 mg at 09/05/19 0750  . ARIPiprazole ER (ABILIFY MAINTENA) injection 400 mg  400 mg Intramuscular Q28 days Clapacs, Madie Reno, MD   400 mg at 09/01/19 1401  . benztropine (COGENTIN) tablet 1 mg  1 mg Oral Daily Cristofano, Dorene Ar, MD   1 mg at 09/05/19 0750  . diphenhydrAMINE (BENADRYL) capsule 50 mg  50 mg Oral BID Cristofano, Dorene Ar, MD   50 mg at 09/05/19 0750   Or  . diphenhydrAMINE (BENADRYL) injection 50 mg  50 mg Intramuscular BID Cristofano, Paul A, MD      . divalproex (DEPAKOTE) DR tablet 500 mg  500 mg Oral Q12H Clapacs, John T, MD   500 mg at 09/05/19 0750  . haloperidol (HALDOL) tablet 10 mg  10 mg Oral BID Cristofano, Dorene Ar, MD   10 mg at 09/05/19 0750   Or  . haloperidol lactate (HALDOL) injection 5 mg  5 mg Intramuscular BID Cristofano, Paul A, MD      . magnesium hydroxide (MILK OF MAGNESIA) suspension 30 mL  30 mL Oral Daily PRN Cristofano, Dorene Ar, MD      . multivitamin with minerals tablet 1 tablet  1 tablet Oral Daily Cristofano, Dorene Ar, MD   1 tablet at 09/05/19 0750   PTA Medications: Medications Prior to Admission  Medication Sig Dispense Refill Last Dose  . ARIPiprazole ER (ABILIFY MAINTENA) 400 MG SRER injection Inject 2 mLs (400 mg total) into the  muscle every 28 (twenty-eight) days. Next injection on 03/21/2018 1 each 1   . divalproex (DEPAKOTE) 500 MG DR tablet Take 1 tablet (500 mg total) by mouth every 12 (twelve) hours. 60 tablet 1   . EPINEPHrine (EPIPEN 2-PAK) 0.3 mg/0.3 mL IJ SOAJ injection Inject 0.3 mLs (0.3 mg total) into the muscle as needed for anaphylaxis. 1 each 0   . haloperidol (HALDOL) 10 MG tablet Take 0.5 tablets (5 mg total) by mouth at bedtime. 30 tablet 1   . haloperidol decanoate (HALDOL DECANOATE) 100 MG/ML injection Inject 1 mL (100 mg total) into the muscle every 28 (twenty-eight) days. Next injection on 04/04/2018 1 mL 1   . Multiple Vitamin (MULTIVITAMIN WITH MINERALS) TABS tablet Take 1 tablet by mouth daily. 30 tablet 1   . vitamin B-12 1000 MCG tablet Take 1 tablet (1,000 mcg total) by mouth daily. 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   Physician Treatment Plan for Primary Diagnosis: Schizoaffective disorder [facility]) Long Term Goal(s): Improvement in symptoms so as ready for discharge Improvement in symptoms so as ready for  discharge   Short Term Goals: Ability to verbalize feelings will improve Ability to demonstrate self-control will improve Ability to maintain clinical measurements within normal limits will improve Compliance with prescribed medications will improve  Medication Management: Evaluate patient's response, side effects, and tolerance of medication regimen.  Therapeutic Interventions: 1 to 1 sessions, Unit Group sessions and Medication administration.  Evaluation of Outcomes: Not Met  Physician Treatment Plan for Secondary Diagnosis: Principal Problem:   Schizoaffective disorder ([place]) Active  Problems:   Cannabis abuse  Long Term Goal(s): Improvement in symptoms so as ready for discharge Improvement in symptoms so as ready for discharge   Short Term Goals: Ability to verbalize feelings will improve Ability to demonstrate self-control will improve Ability to maintain clinical measurements within normal limits will improve Compliance with prescribed medications will improve     Medication Management: Evaluate patient's response, side effects, and tolerance of medication regimen.  Therapeutic Interventions: 1 to 1 sessions, Unit Group sessions and Medication administration.  Evaluation of Outcomes: Not Met   RN Treatment Plan for Primary Diagnosis: Schizoaffective disorder ([place]) Long Term Goal(s): Knowledge of disease and therapeutic regimen to maintain health will improve  Short Term Goals: Ability to demonstrate self-control, Ability to participate in decision making will improve, Ability to verbalize feelings will improve, Ability to identify and develop effective coping behaviors will improve and Compliance with prescribed medications will improve  Medication Management: RN will administer medications as ordered by provider, will assess and evaluate patient's response and provide education to patient for prescribed medication. RN will report any adverse and/or side effects to prescribing provider.  Therapeutic Interventions: 1 on 1 counseling sessions, Psychoeducation, Medication administration, Evaluate responses to treatment, Monitor vital signs and CBGs as ordered, Perform/monitor CIWA, COWS, AIMS and Fall Risk screenings as ordered, Perform wound care treatments as ordered.  Evaluation of Outcomes: Not Met   LCSW Treatment Plan for Primary Diagnosis: Schizoaffective disorder ([place]) Long Term Goal(s): Safe transition to appropriate next level of care at discharge, Engage patient in therapeutic group addressing interpersonal concerns.  Short Term Goals: Engage patient  in aftercare planning with referrals and resources, Increase ability to appropriately verbalize feelings, Increase emotional regulation, Facilitate acceptance of mental health diagnosis and concerns and Increase skills for wellness and recovery  Therapeutic Interventions: Assess for all discharge needs, 1 to 1 time with Social worker, Explore available resources and support systems, Assess for adequacy in community support network, Educate family and significant other(s) on suicide prevention, Complete Psychosocial Assessment, Interpersonal group therapy.  Evaluation of Outcomes: Not Met   Progress in Treatment: Attending groups: No. Participating in groups: No. Taking medication as prescribed: Yes. Toleration medication: Yes. Family/Significant other contact made: No, will contact:  when consent given Patient understands diagnosis: No. Discussing patient identified problems/goals with staff: Yes. Medical problems stabilized or resolved: Yes. Denies suicidal/homicidal ideation: Yes. Issues/concerns per patient self-inventory: No. Other: N/A  New problem(s) identified: No, Describe:  none  New Short Term/Long Term Goal(s): Detox, elimination of AVH/symptoms of psychosis, medication management for mood stabilization; elimination of SI thoughts; development of comprehensive mental wellness/sobriety plan.   Patient Goals:  "Get better and get on"  Discharge Plan or Barriers: SPE pamphlet, Mobile Crisis information, and AA/NA information provided to patient for additional community support and resources. CSW assessing for appropriate referrals.  Reason for Continuation of Hospitalization: Aggression Mania Medication stabilization  Estimated Length of Stay: 5-7 days  Recreational Therapy: Patient: N/A Patient Goal: Patient will engage in groups without prompting or  encouragement from LRT x3 group sessions within 5 recreation therapy group sessions.  Attendees: Patient: Jenna Bell 09/05/2019 10:14 AM  Physician: Johnn Hai, MD 09/05/2019 10:14 AM  Nursing: Javier Glazier RN 09/05/2019 10:14 AM  RN Care Manager: 09/05/2019 10:14 AM  Social Worker: Netta Neat, LCSW, Virgel Gess, MSW, [place] 09/05/2019 10:14 AM  Recreational Therapist:  09/05/2019 10:14 AM  Other:  09/05/2019 10:14 AM  Other:  09/05/2019 10:14 AM  Other: 09/05/2019 10:14 AM    Scribe for Treatment Team: Netta Neat, MSW, LCSW Clinical Social Work 09/05/2019 10:14 AM

## 2019-09-05 NOTE — Progress Notes (Signed)
Patient alert and oriented x 4, with periods of confusion to situation and time,  her thoughts are disorganized and incoherent she was restless,her affect is labile not interacting appropriately with peers and staff. Patient was noted  pacing the unit responding to internal stimuli although she demes SI/HI/AVH. Patient was offered emotional support and  given scheduled  medication regime for this shift. 15 minutes safety checks maintained will continue to monitor. Marland Kitchen
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
## 2019-09-05 NOTE — [facility] Group Notes (Signed)
[facility] Group Notes:  (Nursing/MHT/Case Management/Adjunct)  Date:  09/05/2019  Time:  9:20 PM  Type of Therapy:  Group Therapy  Participation Level:  Active  Participation Quality:  Appropriate  Affect:  Appropriate  Cognitive:  Alert  Insight:  Good  Engagement in Group:  Engaged  Modes of Intervention:  Support  Summary of Progress/Problems:  Mayra Neer 09/05/2019, 9:20 PM

## 2019-09-05 NOTE — [facility] Group Notes (Signed)
LCSW Group Therapy 09/05/2019 1:00pm  Type of Therapy and Topic:  Group Therapy:  Change and Accountability  Participation Level:  Did Not Attend  Description of Group In this group, patients discussed power and accountability for change.  The group identified the challenges related to accountability and the difficulty of accepting the outcomes of negative behaviors.  Patients were encouraged to openly discuss a challenge/change they could take responsibility for.  Patients discussed the use of "change talk" and positive thinking as ways to support achievement of personal goals.  The group discussed ways to give support and empowerment to peers.  Therapeutic Goals: 1. Patients will state the relationship between personal power and accountability in the change process 2. Patients will identify the positive and negative consequences of a personal choice they have made 3. Patients will identify one challenge/choice they will take responsibility for making 4. Patients will discuss the role of "change talk" and the impact of positive thinking as it supports successful personal change 5. Patients will verbalize support and affirmation of change efforts in peers  Summary of Patient Progress:  Patient did not attend.   Therapeutic Modalities Solution Focused Brief Therapy Motivational Interviewing Cognitive Behavioral Therapy    Roselyn Bering, MSW, LCSW Clinical Social Work 09/05/2019 10:50 AM

## 2019-09-06 NOTE — Plan of Care (Signed)
D: Pt during assessments this morning is pleasant and cooperative. Pt. Verbalizes poor insight into why she has been admitted to the unit with mixed logic. Pt. Thought process is mostly irrelevant with loose associations. Pt. Denies suicidal, homicidal, and AVH. Pt. Verbalizes safety on the unit. Pt. Expresses wanting to get her medications, "working", so she can leave. Pt. Denies physical pain. Pt. endorses her mood as, "good". Pt. Reports tolerating her medication good. Pt. Visibly presents disheveled.   A: Q x 15 minute observation checks in place/maintained for safety. Patient was and is provided with education throughout shift when appropriate.  Patient was and will be given/offered medications per orders. Patient was and is encouraged to attend groups, participate in unit activities and continue with plan of care. Pt. Chart and plans of care reviewed. Pt. Given support and encouragement when appropriate.    R: Patient is complaint with medication and unit procedures thus far. Pt. Has been observed eating good on the unit. Pt. Besides meals has been mostly isolative and withdrawn to room resting. No observations thus far of RTIS.      Problem: Education: Goal: Knowledge of [place] [facility] Education information/materials will improve Outcome: Progressing Goal: Emotional status will improve Outcome: Progressing Goal: Mental status will improve Outcome: Progressing Goal: Verbalization of understanding the information provided will improve Outcome: Progressing   Problem: Safety: Goal: Periods of time without injury will increase Outcome: Progressing   Problem: Education: Goal: Will be free of psychotic symptoms Outcome: Progressing   Problem: Coping: Goal: Coping ability will improve Outcome: Progressing   Problem: Health Behavior/Discharge Planning: Goal: Compliance with prescribed medication regimen will improve Outcome: Progressing   Problem: Self-Concept: Goal: Level of  anxiety will decrease Outcome: Progressing

## 2019-09-06 NOTE — [facility] Group Notes (Signed)
LCSW GROUP THERAPY NOTE    09/06/2019 11:51 AM   Type of Therapy and Topic:  Group Therapy:  Change and Accountability  Participation Level:  Did Not Attend  Description of Group: In this group, patients discussed power and accountability for change.  The group identified the challenges related to accountability and the difficulty of accepting the outcomes of negative behaviors.  Patients were encouraged to openly discuss a challenge/change they could take responsibility for.  Patients discussed the use of "change talk" and positive thinking as ways to support achievement of personal goals.  The group discussed ways to give support and empowerment to peers.  Therapeutic Goals: 1. Patients will state the relationship between personal power and accountability in the change process 2. Patients will identify the positive and negative consequences of a personal choice they have made 3. Patients will identify one challenge/choice they will take responsibility for making 4. Patients will discuss the role of "change talk" and the impact of positive thinking as it supports successful personal change 5. Patients will verbalize support and affirmation of change efforts in peers  Summary of Patient Progress: X  Therapeutic Modalities Solution Focused Brief Therapy Motivational Interviewing Cognitive Behavioral Therapy  Harden Mo, LCSW 09/06/2019 11:51 AM

## 2019-09-06 NOTE — Progress Notes (Signed)
[facility] MD Progress Note  09/06/2019 10:14 AM Jenna Bell  MRN:  [id number] Subjective:    Mala continues to deny the previously detailed dangerous behaviors, she states she still "does not know why" she was hospitalized She denies auditory or visual hallucinations, she denies wanting to harm self or others, states she plans to go to [facility] upon discharge She is requesting discharge "soon" and she is tolerating her medications though she does not find them necessary. No EPS or TD Principal Problem: Schizoaffective disorder (HCC) Diagnosis: Principal Problem:   Schizoaffective disorder (HCC) Active Problems:   Cannabis abuse  Total Time spent with patient: 20 minutes  Past Psychiatric History: See eval known schizoaffective disorder with prior similar presentations  Past Medical History:  Past Medical History:  Diagnosis Date  . Bipolar disorder ([place])    History reviewed. No pertinent surgical history. Family History: History reviewed. No pertinent family history. Family Psychiatric  History: No new data Social History:  Social History   Substance and Sexual Activity  Alcohol Use Not Currently     Social History   Substance and Sexual Activity  Drug Use Yes  . Types: Methamphetamines    Social History   Socioeconomic History  . Marital status: Single    Spouse name: Not on file  . Number of children: Not on file  . Years of education: Not on file  . Highest education level: Not on file  Occupational History  . Not on file  Tobacco Use  . Smoking status: Current Every Day Smoker    Packs/day: 0.25    Types: Cigarettes  . Smokeless tobacco: Never Used  Substance and Sexual Activity  . Alcohol use: Not Currently  . Drug use: Yes    Types: Methamphetamines  . Sexual activity: Yes  Other Topics Concern  . Not on file  Social History Narrative  . Not on file   Social Determinants of Health   Financial Resource Strain:   . Difficulty of Paying  Living Expenses: Not on file  Food Insecurity:   . Worried About Charity fundraiser in the Last Year: Not on file  . Ran Out of Food in the Last Year: Not on file  Transportation Needs:   . Lack of Transportation (Medical): Not on file  . Lack of Transportation (Non-Medical): Not on file  Physical Activity:   . Days of Exercise per Week: Not on file  . Minutes of Exercise per Session: Not on file  Stress:   . Feeling of Stress : Not on file  Social Connections:   . Frequency of Communication with Friends and Family: Not on file  . Frequency of Social Gatherings with Friends and Family: Not on file  . Attends Religious Services: Not on file  . Active Member of Clubs or Organizations: Not on file  . Attends Archivist Meetings: Not on file  . Marital Status: Not on file   Additional Social History:    Pain Medications: see PTA Prescriptions: see PTA Over the Counter: see PTA                    Sleep: Some during the day resulting in poor during the night  Appetite:  Fair  Current Medications: Current Facility-Administered Medications  Medication Dose Route Frequency Provider Last Rate Last Admin  . acetaminophen (TYLENOL) tablet 650 mg  650 mg Oral Q6H PRN Cristofano, Dorene Ar, MD      . alum & mag hydroxide-simeth (  MAALOX/MYLANTA) 200-200-20 MG/5ML suspension 30 mL  30 mL Oral Q4H PRN Cristofano, Paul A, MD      . ARIPiprazole (ABILIFY) tablet 10 mg  10 mg Oral Daily Money, Gerlene Burdock, FNP   10 mg at 09/06/19 6433  . ARIPiprazole ER (ABILIFY MAINTENA) injection 400 mg  400 mg Intramuscular Q28 days Clapacs, Jackquline Denmark, MD   400 mg at 09/01/19 1401  . benztropine (COGENTIN) tablet 1 mg  1 mg Oral Daily Cristofano, Worthy Rancher, MD   1 mg at 09/06/19 0752  . diphenhydrAMINE (BENADRYL) capsule 50 mg  50 mg Oral BID Cristofano, Paul A, MD   50 mg at 09/06/19 2951   Or  . diphenhydrAMINE (BENADRYL) injection 50 mg  50 mg Intramuscular BID Cristofano, Paul A, MD      .  divalproex (DEPAKOTE) DR tablet 500 mg  500 mg Oral Q12H Clapacs, John T, MD   500 mg at 09/06/19 8841  . haloperidol (HALDOL) tablet 10 mg  10 mg Oral BID Cristofano, Worthy Rancher, MD   10 mg at 09/06/19 6606   Or  . haloperidol lactate (HALDOL) injection 5 mg  5 mg Intramuscular BID Cristofano, Paul A, MD      . magnesium hydroxide (MILK OF MAGNESIA) suspension 30 mL  30 mL Oral Daily PRN Cristofano, Worthy Rancher, MD      . multivitamin with minerals tablet 1 tablet  1 tablet Oral Daily Cristofano, Worthy Rancher, MD   1 tablet at 09/06/19 0752  . temazepam (RESTORIL) capsule 30 mg  30 mg Oral QHS Malvin Johns, MD   30 mg at 09/05/19 2105    Lab Results: No results found for this or any previous visit (from the past 48 hour(s)).  Blood Alcohol level:  Lab Results  Component Value Date   ETH <10 08/29/2019   ETH 13 (H) 03/16/2018    Metabolic Disorder Labs: Lab Results  Component Value Date   HGBA1C 5.4 02/12/2018   MPG 108 02/12/2018   No results found for: PROLACTIN Lab Results  Component Value Date   CHOL 179 09/01/2019   TRIG 81 09/01/2019   HDL 31 (L) 09/01/2019   CHOLHDL 5.8 09/01/2019   VLDL 16 09/01/2019   LDLCALC 132 (H) 09/01/2019   LDLCALC 111 (H) 02/12/2018     Musculoskeletal: Strength & Muscle Tone: within normal limits Gait & Station: normal Patient leans: N/A  Psychiatric Specialty Exam: Physical Exam  Review of Systems  Blood pressure 131/79, pulse 98, temperature 98.2 F (36.8 C), temperature source Oral, resp. rate 18, height 5\' 7"  (1.702 m), weight 69.4 kg, last menstrual period 08/28/2019, SpO2 100 %, unknown if currently breastfeeding.Body mass index is 23.96 kg/m.  General Appearance: Disheveled  Eye Contact:  Poor  Speech:  Clear and Coherent  Volume:  Normal  Mood:  Euthymic  Affect:  Congruent  Thought Process:  Irrelevant and Descriptions of Associations: Loose  Orientation:  Other:  Person place time situation not exact date  Thought Content:   Illogical and But denies auditory or visual hallucinations  Suicidal Thoughts:  No  Homicidal Thoughts:  No  Memory:  Immediate;   Fair Recent;   Fair Remote;   Fair  Judgement:  Impaired  Insight:  Shallow  Psychomotor Activity:  Normal  Concentration:  Concentration: Fair and Attention Span: Fair  Recall:  08/30/2019 of Knowledge:  Good  Language:  Fair  Akathisia:  Negative  Handed:  Right  AIMS (if indicated):  Assets:  Physical Health Resilience  ADL's:  Intact  Cognition:  WNL  Sleep:  Number of Hours: 3 but noted to be sleeping some during the day     Treatment Plan Summary: Daily contact with patient to assess and evaluate symptoms and progress in treatment and Medication management  Continue combination long-acting injectable and oral aripiprazole Continue Sleep Aid and encourage consistency in days and nights and discouraged sleep during the day Continue reality based therapy No change in precautions  Jenna Griebel, MD 09/06/2019, 10:14 AM

## 2019-09-07 DIAGNOSIS — F250 Schizoaffective disorder, bipolar type: Secondary | ICD-10-CM

## 2019-09-07 MED ORDER — ARIPIPRAZOLE 10 MG PO TABS: 10.0000 mg | ORAL_TABLET | Freq: Every day | ORAL | 1 refills | Status: AC

## 2019-09-07 MED ORDER — BENZTROPINE MESYLATE 1 MG PO TABS: 1.0000 mg | ORAL_TABLET | Freq: Every day | ORAL | 1 refills | Status: AC

## 2019-09-07 MED ORDER — DIVALPROEX SODIUM 500 MG PO DR TAB: 500.0000 mg | DELAYED_RELEASE_TABLET | Freq: Two times a day (BID) | ORAL | 1 refills | Status: AC

## 2019-09-07 MED ORDER — TEMAZEPAM 30 MG PO CAPS: 30.0000 mg | ORAL_CAPSULE | Freq: Every day | ORAL | 1 refills | Status: AC

## 2019-09-07 MED ORDER — ARIPIPRAZOLE ER 400 MG IM SRER: 400.0000 mg | INTRAMUSCULAR | 1 refills | Status: AC

## 2019-09-07 MED ORDER — HALOPERIDOL 10 MG PO TABS: 10.0000 mg | ORAL_TABLET | Freq: Two times a day (BID) | ORAL | 1 refills | Status: AC

## 2019-09-07 NOTE — Progress Notes (Signed)
Recreation Therapy Notes   Date: 09/07/2019  Time: 9:30 am   Location: Craft room   Behavioral response: N/A   Intervention Topic: Necessities   Discussion/Intervention: Patient did not attend group.   Clinical Observations/Feedback:  Patient did not attend group.   Jenna Bell LRT/CTRS        Glennda Weatherholtz 09/07/2019 11:46 AM

## 2019-09-07 NOTE — Progress Notes (Signed)
  [facility] Adult Case Management Discharge Plan :  Will you be returning to the same living situation after discharge:  No. Patient reports that she is returning to the home with her boyfriend. At discharge, do you have transportation home?: Yes,  Pt reports that a peer will provide transportation. Do you have the ability to pay for your medications: Yes,  [facility]  Release of information consent forms completed and in the chart;  Patient's signature needed at discharge.  Patient to Follow up at: Follow-up Information    [facility] Follow up on 09/14/2019.   Why: You have an appointment on 09/14/19 at 10am. It will be over the phone and they will call you at [phone]. If you have any questions or need to provide a different number please contact them directly. Thank you! Contact information: [address] [place] [address] [phone]           [name] has access to [facility] Link:no  Safety Planning and Suicide Prevention discussed: Yes,  SPE completed with patient's mother.  Have you used any form of tobacco in the last 30 days? (Cigarettes, Smokeless Tobacco, Cigars, and/or Pipes): Yes  Has patient been referred to the Quitline?: N/A patient is not a smoker  Patient has been referred for addiction treatment: Pt. refused referral  Harden Mo, LCSW 09/07/2019, 11:26 AM

## 2019-09-07 NOTE — Progress Notes (Signed)
Slept 5.5 hours

## 2019-09-07 NOTE — BHH Counselor (Signed)
CSW checked in with [facility] in [place].  Per them, the shelter is currently filled.  Penni Homans, MSW, LCSW 09/07/2019 9:11 AM
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
## 2019-09-07 NOTE — Progress Notes (Signed)
D: Patient is aware of  Discharge this shift .  A:Patient denies suicidal /homicidal ideations. Patient received all belongings brought in. No Storage medications. Writer reviewed Discharge Summary, Suicide Risk Assessment, and Transitional Record. Patient also received Prescriptions   from  MD. Aware  Of follow up appointment .  R: Patient left unit with no questions  Or concerns  With friend.  

## 2019-09-07 NOTE — Plan of Care (Signed)
D: Pt during assessments this evening is pleasant and cooperative. Pt was observed talking to herself in the hallway on this shift. Pt. Denies suicidal, homicidal, and AVH. Pt. Verbalizes safety on the unit. Pt. Expresses wanting to get her medications, "working", so she can leave. Pt. Denies physical pain. Pt. endorses her mood as, "good". Pt. Reports tolerating her medication good. Pt. Visibly presents disheveled.   A: Q x 15 minute observation checks in place/maintained for safety.  Patient given/offered medications per orders. Patient  is encouraged to attend groups, participate in unit activities and continue with plan of care. Pt. Chart and plans of care reviewed. Pt. Given support and encouragement when appropriate.    R: Patient is complaint with medication and unit procedures thus far. Pt. Has been observed eating good on the unit. Pt. Besides meals has been mostly isolative and withdrawn to room resting. No observations thus far of RTIS.      Problem: Education: Goal: Knowledge of [place] General Education information/materials will improve Outcome: Progressing Goal: Emotional status will improve Outcome: Progressing Goal: Mental status will improve Outcome: Progressing Goal: Verbalization of understanding the information provided will improve Outcome: Progressing   Problem: Safety: Goal: Periods of time without injury will increase Outcome: Progressing   Problem: Education: Goal: Will be free of psychotic symptoms Outcome: Progressing   Problem: Coping: Goal: Coping ability will improve Outcome: Progressing   Problem: Health Behavior/Discharge Planning: Goal: Compliance with prescribed medication regimen will improve Outcome: Progressing   Problem: Self-Concept: Goal: Level of anxiety will decrease Outcome: Progressing

## 2019-09-07 NOTE — Plan of Care (Signed)
  Problem: Group Participation Goal: STG - Patient will engage in groups without prompting or encouragement from LRT x3 group sessions within 5 recreation therapy group sessions Description: STG - Patient will engage in groups without prompting or encouragement from LRT x3 group sessions within 5 recreation therapy group sessions Outcome: Completed/Met

## 2019-09-07 NOTE — Progress Notes (Signed)
Recreation Therapy Notes  INPATIENT RECREATION TR PLAN  Patient Details Name: Jenna Bell MRN: [id number] DOB: 08-Dec-1985 Today's Date: 09/07/2019  Rec Therapy Plan Treatment times per week: At least 3 Estimated Length of Stay: 5-7 days TR Treatment/Interventions: Group participation (Comment)  Discharge Criteria Pt will be discharged from therapy if:: Discharged Treatment plan/goals/alternatives discussed and agreed upon by:: Patient/family  Discharge Summary Short term goals set: Patient will engage in groups without prompting or encouragement from LRT x3 group sessions within 5 recreation therapy group sessions Short term goals met: Complete Progress toward goals comments: Groups attended Which groups?: Self-esteem, Other (Comment)(Decision Making, Relaxation) Reason goals not met: N/A Therapeutic equipment acquired: N/A Reason patient discharged from therapy: Discharge from hospital Pt/family agrees with progress & goals achieved: Yes Date patient discharged from therapy: 09/07/19   Leslieann Whisman 09/07/2019, 1:10 PM

## 2019-09-07 NOTE — Plan of Care (Signed)
  Problem: Education: Goal: Knowledge of  General Education information/materials will improve Outcome: Adequate for Discharge Goal: Emotional status will improve Outcome: Adequate for Discharge Goal: Mental status will improve Outcome: Adequate for Discharge Goal: Verbalization of understanding the information provided will improve Outcome: Adequate for Discharge   Problem: Safety: Goal: Periods of time without injury will increase Outcome: Adequate for Discharge   Problem: Education: Goal: Will be free of psychotic symptoms Outcome: Adequate for Discharge   Problem: Coping: Goal: Coping ability will improve Outcome: Adequate for Discharge Goal: Will verbalize feelings Outcome: Adequate for Discharge   Problem: Health Behavior/Discharge Planning: Goal: Compliance with prescribed medication regimen will improve Outcome: Adequate for Discharge   Problem: Self-Concept: Goal: Level of anxiety will decrease Outcome: Adequate for Discharge

## 2019-09-07 NOTE — [facility] Suicide Risk Assessment (Signed)
[facility] Discharge Suicide Risk Assessment   Principal Problem: Schizoaffective disorder [facility]) Discharge Diagnoses: Principal Problem:   Schizoaffective disorder (HCC) Active Problems:   Cannabis abuse   Total Time spent with patient: 30 minutes  Musculoskeletal: Strength & Muscle Tone: within normal limits Gait & Station: normal Patient leans: N/A  Psychiatric Specialty Exam: Review of Systems  Constitutional: Negative.   HENT: Negative.   Eyes: Negative.   Respiratory: Negative.   Cardiovascular: Negative.   Gastrointestinal: Negative.   Musculoskeletal: Negative.   Skin: Negative.   Neurological: Negative.   Psychiatric/Behavioral: Negative.     Blood pressure 129/81, pulse 100, temperature 97.7 F (36.5 C), temperature source Oral, resp. rate 18, height 5\' 7"  (1.702 m), weight 69.4 kg, last menstrual period 08/28/2019, SpO2 100 %, unknown if currently breastfeeding.Body mass index is 23.96 kg/m.  General Appearance: Casual  Eye Contact::  Good  Speech:  Clear and Coherent409  Volume:  Normal  Mood:  Euthymic  Affect:  Constricted  Thought Process:  Coherent  Orientation:  Full (Time, Place, and Person)  Thought Content:  Logical  Suicidal Thoughts:  No  Homicidal Thoughts:  No  Memory:  Immediate;   Fair Recent;   Fair Remote;   Fair  Judgement:  Fair  Insight:  Fair  Psychomotor Activity:  Normal  Concentration:  Fair  Recall:  [phone] of Knowledge:Fair  Language: Fair  Akathisia:  No  Handed:  Right  AIMS (if indicated):     Assets:  Desire for Improvement Housing Physical Health Resilience  Sleep:  Number of Hours: 3  Cognition: Impaired,  Mild  ADL's:  Intact   Mental Status Per Nursing Assessment::   On Admission:  NA  Demographic Factors:  Low socioeconomic status  Loss Factors: NA  Historical Factors: Impulsivity  Risk Reduction Factors:   Living with another person, especially a relative and Positive social support  Continued  Clinical Symptoms:  Schizophrenia:   Less than 56 years old  Cognitive Features That Contribute To Risk:  None    Suicide Risk:  Minimal: No identifiable suicidal ideation.  Patients presenting with no risk factors but with morbid ruminations; may be classified as minimal risk based on the severity of the depressive symptoms  Follow-up Information    [facility] Follow up on 09/14/2019.   Why: You have an appointment on 09/14/19 at 10am. It will be over the phone and they will call you at [phone]. If you have any questions or need to provide a different number please contact them directly. Thank you! Contact information: [address] [place] [phone]           Plan Of Care/Follow-up recommendations:  Activity:  Activity as tolerated Diet:  Regular diet Other:  Follow-up outpatient treatment with an appointment with [facility].  [phone], MD 09/07/2019, 12:04 PM

## 2019-09-07 NOTE — [facility] Group Notes (Signed)
Overcoming Obstacles  09/07/2019 1PM  Type of Therapy and Topic:  Group Therapy:  Overcoming Obstacles  Participation Level:  Did Not Attend    Description of Group:    In this group patients will be encouraged to explore what they see as obstacles to their own wellness and recovery. They will be guided to discuss their thoughts, feelings, and behaviors related to these obstacles. The group will process together ways to cope with barriers, with attention given to specific choices patients can make. Each patient will be challenged to identify changes they are motivated to make in order to overcome their obstacles. This group will be process-oriented, with patients participating in exploration of their own experiences as well as giving and receiving support and challenge from other group members.   Therapeutic Goals: 1. Patient will identify personal and current obstacles as they relate to admission. 2. Patient will identify barriers that currently interfere with their wellness or overcoming obstacles.  3. Patient will identify feelings, thought process and behaviors related to these barriers. 4. Patient will identify two changes they are willing to make to overcome these obstacles:      Summary of Patient Progress     Therapeutic Modalities:   Cognitive Behavioral Therapy Solution Focused Therapy Motivational Interviewing Relapse Prevention Therapy    Lowella Dandy, MSW, LCSW 09/07/2019 1:56 PM

## 2020-08-10 ENCOUNTER — Emergency Department

## 2020-08-10 DIAGNOSIS — Z5321 Procedure and treatment not carried out due to patient leaving prior to being seen by health care provider: Secondary | ICD-10-CM

## 2020-08-10 NOTE — ED Notes (Signed)
Patient swabbed for flu, now refusing covid swab, "fuck this I'm going". Pt extied lobby with steady gait in NAD, vss

## 2020-08-10 NOTE — ED Notes (Signed)
Patient arrives reporting congestion, cough, body aches, feverish. Pt states she is not flu or covid vaccinated.

## 2020-08-10 NOTE — ED Notes (Signed)
Per registration pt was woken up while over in outpatient registration, became agitated and when asked if she was going to sign in to ER she said yes.

## 2020-08-11 ENCOUNTER — Inpatient Hospital Stay: Admit: 2020-08-11 | Discharge: 2020-08-11

## 2020-08-12 ENCOUNTER — Inpatient Hospital Stay: Admit: 2020-08-12 | Discharge: 2020-08-12 | Attending: Emergency Medicine

## 2020-08-12 NOTE — ED Notes (Signed)
Pt called for triage.  Pt refusing treatment, stated she was leaving.  Dr Sena Slate made aware.

## 2021-08-09 ENCOUNTER — Other Ambulatory Visit: Payer: Self-pay | Admitting: Internal Medicine

## 2021-08-10 LAB — COMPLETE METABOLIC PANEL WITH GFR
AG Ratio: 1.2 (calc) (ref 1.0–2.5)
ALT: 15 U/L (ref 6–29)
AST: 16 U/L (ref 10–30)
Albumin: 4.3 g/dL (ref 3.6–5.1)
Alkaline phosphatase (APISO): 84 U/L (ref 31–125)
BUN: 17 mg/dL (ref 7–25)
CO2: 22 mmol/L (ref 20–32)
Calcium: 10.1 mg/dL (ref 8.6–10.2)
Chloride: 103 mmol/L (ref 98–110)
Creat: 0.97 mg/dL (ref 0.50–0.97)
Globulin: 3.7 g/dL (calc) (ref 1.9–3.7)
Glucose, Bld: 155 mg/dL — ABNORMAL HIGH (ref 65–99)
Potassium: 4.3 mmol/L (ref 3.5–5.3)
Sodium: 138 mmol/L (ref 135–146)
Total Bilirubin: 0.2 mg/dL (ref 0.2–1.2)
Total Protein: 8 g/dL (ref 6.1–8.1)
eGFR: 77 mL/min/{1.73_m2} (ref >=60)

## 2021-08-10 LAB — CBC
HCT: 38.1 % (ref 35.0–45.0)
Hemoglobin: 12 g/dL (ref 11.7–15.5)
MCH: 24.6 pg — ABNORMAL LOW (ref 27.0–33.0)
MCHC: 31.5 g/dL — ABNORMAL LOW (ref 32.0–36.0)
MCV: 78.2 fL — ABNORMAL LOW (ref 80.0–100.0)
MPV: 11.1 fL (ref 7.5–12.5)
Platelets: 449 10*3/uL — ABNORMAL HIGH (ref 140–400)
RBC: 4.87 10*6/uL (ref 3.80–5.10)
RDW: 13.8 % (ref 11.0–15.0)
WBC: 7.2 10*3/uL (ref 3.8–10.8)

## 2021-08-10 LAB — LIPID PANEL
Cholesterol: 240 mg/dL — ABNORMAL HIGH (ref <200)
HDL: 39 mg/dL — ABNORMAL LOW (ref >=50)
LDL Cholesterol (Calc): 162 mg/dL (calc) — ABNORMAL HIGH
Non-HDL Cholesterol (Calc): 201 mg/dL (calc) — ABNORMAL HIGH (ref <130)
Total CHOL/HDL Ratio: 6.2 (calc) — ABNORMAL HIGH (ref <5.0)
Triglycerides: 217 mg/dL — ABNORMAL HIGH (ref <150)

## 2021-08-10 LAB — VITAMIN D 25 HYDROXY (VIT D DEFICIENCY, FRACTURES): Vit D, 25-Hydroxy: 44 ng/mL (ref 30–100)

## 2021-08-10 LAB — TSH: TSH: 1.35 mIU/L

## 2022-01-16 ENCOUNTER — Ambulatory Visit: Payer: Medicare (Managed Care) | Admitting: Obstetrics

## 2022-04-06 ENCOUNTER — Encounter: Payer: Medicaid Other | Admitting: Student

## 2022-06-26 ENCOUNTER — Encounter: Payer: Self-pay | Admitting: Obstetrics

## 2022-06-26 ENCOUNTER — Ambulatory Visit (INDEPENDENT_AMBULATORY_CARE_PROVIDER_SITE_OTHER): Payer: Medicare (Managed Care) | Admitting: Obstetrics

## 2022-06-26 ENCOUNTER — Other Ambulatory Visit (HOSPITAL_COMMUNITY)
Admission: RE | Admit: 2022-06-26 | Discharge: 2022-06-26 | Disposition: A | Discharge status: Home / Self Care | Payer: Medicare (Managed Care) | Source: Ambulatory Visit | Attending: Student | Admitting: Student

## 2022-06-26 VITALS — BP 128/74 | HR 93 | Ht 67.0 in | Wt 207.7 lb

## 2022-06-26 DIAGNOSIS — Z6832 Body mass index (BMI) 32.0-32.9, adult: Secondary | ICD-10-CM

## 2022-06-26 DIAGNOSIS — E669 Obesity, unspecified: Secondary | ICD-10-CM | POA: Diagnosis not present

## 2022-06-26 DIAGNOSIS — Z01419 Encounter for gynecological examination (general) (routine) without abnormal findings: Secondary | ICD-10-CM | POA: Insufficient documentation

## 2022-06-26 DIAGNOSIS — E66811 Obesity, class 1: Secondary | ICD-10-CM

## 2022-06-26 DIAGNOSIS — Z1151 Encounter for screening for human papillomavirus (HPV): Secondary | ICD-10-CM | POA: Insufficient documentation

## 2022-06-26 DIAGNOSIS — Z01411 Encounter for gynecological examination (general) (routine) with abnormal findings: Secondary | ICD-10-CM

## 2022-06-26 DIAGNOSIS — Z3009 Encounter for other general counseling and advice on contraception: Secondary | ICD-10-CM

## 2022-06-26 DIAGNOSIS — N898 Other specified noninflammatory disorders of vagina: Secondary | ICD-10-CM

## 2022-06-26 NOTE — Progress Notes (Signed)
NGYN presents to establish care. Last PAP unknown. Pt decline STD testing. No other concerns at this time.

## 2022-06-26 NOTE — Progress Notes (Signed)
Subjective:        Jenna Bell is a 36 y.o. female here for a routine exam.  Current complaints: Vaginal discharge.    Personal health questionnaire:  Is patient Ashkenazi Jewish, have a family history of breast and/or ovarian cancer: no Is there a family history of uterine cancer diagnosed at age < 35, gastrointestinal cancer, urinary tract cancer, family member who is a Personnel officer syndrome-associated carrier: no Is the patient overweight and hypertensive, family history of diabetes, personal history of gestational diabetes, preeclampsia or PCOS: yes Is patient over 61, have PCOS,  family history of premature CHD under age 15, diabetes, smoke, have hypertension or peripheral artery disease:  no At any time, has a partner hit, kicked or otherwise hurt or frightened you?: no Over the past 2 weeks, have you felt down, depressed or hopeless?: no Over the past 2 weeks, have you felt little interest or pleasure in doing things?:no   Gynecologic History Patient's last menstrual period was 06/22/2022. Contraception: none Last Pap: unknown. Results were: unknown Last mammogram: n/a. Results were: n/a  Obstetric History OB History  Gravida Para Term Preterm AB Living  1       1    SAB IAB Ectopic Multiple Live Births    1          # Outcome Date GA Lbr Len/2nd Weight Sex Delivery Anes PTL Lv  1 IAB 2009            Past Medical History:  Diagnosis Date   Bipolar disorder (HCC)    Diabetes (HCC)     History reviewed. No pertinent surgical history.   Current Outpatient Medications:    ARIPiprazole (ABILIFY) 10 MG tablet, Take 1 tablet (10 mg total) by mouth daily., Disp: 30 tablet, Rfl: 1   ARIPiprazole ER (ABILIFY MAINTENA) 400 MG SRER injection, Inject 2 mLs (400 mg total) into the muscle every 28 (twenty-eight) days. Next dose due September 29, 2019, Disp: 1 each, Rfl: 1   benztropine (COGENTIN) 1 MG tablet, Take 1 tablet (1 mg total) by mouth daily., Disp: 30 tablet, Rfl:  1   divalproex (DEPAKOTE) 500 MG DR tablet, Take 1 tablet (500 mg total) by mouth every 12 (twelve) hours., Disp: 60 tablet, Rfl: 1   EPINEPHrine (EPIPEN 2-PAK) 0.3 mg/0.3 mL IJ SOAJ injection, Inject 0.3 mLs (0.3 mg total) into the muscle as needed for anaphylaxis., Disp: 1 each, Rfl: 0   haloperidol (HALDOL) 10 MG tablet, Take 1 tablet (10 mg total) by mouth 2 (two) times daily., Disp: 60 tablet, Rfl: 1   metformin (FORTAMET) 500 MG (OSM) 24 hr tablet, Take 500 mg by mouth daily with breakfast., Disp: , Rfl:    temazepam (RESTORIL) 30 MG capsule, Take 1 capsule (30 mg total) by mouth at bedtime., Disp: 30 capsule, Rfl: 1 No Known Allergies  Social History   Tobacco Use   Smoking status: Every Day    Packs/day: 0.25    Types: Cigarettes   Smokeless tobacco: Never  Substance Use Topics   Alcohol use: Not Currently    History reviewed. No pertinent family history.    Review of Systems  Constitutional: negative for fatigue and weight loss Respiratory: negative for cough and wheezing Cardiovascular: negative for chest pain, fatigue and palpitations Gastrointestinal: negative for abdominal pain and change in bowel habits Musculoskeletal:negative for myalgias Neurological: negative for gait problems and tremors Behavioral/Psych: negative for abusive relationship, depression Endocrine: negative for temperature intolerance    Genitourinary:negative  for abnormal menstrual periods, genital lesions, hot flashes, sexual problems and vaginal discharge Integument/breast: negative for breast lump, breast tenderness, nipple discharge and skin lesion(s)    Objective:       BP 128/74   Pulse 93   Ht 5\' 7"  (1.702 m)   Wt 207 lb 11.2 oz (94.2 kg)   LMP 06/22/2022   BMI 32.53 kg/m  General:   alert  Skin:   no rash or abnormalities  Lungs:   clear to auscultation bilaterally  Heart:   regular rate and rhythm, S1, S2 normal, no murmur, click, rub or gallop  Breasts:   normal without  suspicious masses, skin or nipple changes or axillary nodes  Abdomen:  normal findings: no organomegaly, soft, non-tender and no hernia  Pelvis:  External genitalia: normal general appearance Urinary system: urethral meatus normal and bladder without fullness, nontender Vaginal: normal without tenderness, induration or masses Cervix: normal appearance Adnexa: normal bimanual exam Uterus: anteverted and non-tender, normal size   Lab Review Urine pregnancy test Labs reviewed yes Radiologic studies reviewed no  I have spent a total of 20 minutes of face-to-face time, excluding clinical staff time, reviewing notes and preparing to see patient, ordering tests and/or medications, and counseling the patient.   Assessment:    1. Encounter for gynecological examination with Papanicolaou smear of cervix x - Cytology - PAP( [facility])  2. Vaginal discharge Rx: - Cervicovaginal ancillary only  3. Encounter for other general counseling and advice on contraception - declines   4. Obesity (BMI 30.0-34.9) - weight reduction recommended     Plan:    Education reviewed: calcium supplements, depression evaluation, low fat, low cholesterol diet, safe sex/STD prevention, self breast exams, smoking cessation, and weight bearing exercise. Contraception: none. Follow up in: 1 year.     12-08-1994, MD 06/26/2022 4:50 PM

## 2022-06-27 LAB — CERVICOVAGINAL ANCILLARY ONLY
Bacterial Vaginitis (gardnerella): POSITIVE — AB
Candida Glabrata: NEGATIVE
Candida Vaginitis: NEGATIVE
Chlamydia: NEGATIVE
Comment: NEGATIVE
Comment: NEGATIVE
Comment: NEGATIVE
Comment: NEGATIVE
Comment: NEGATIVE
Comment: NORMAL
Neisseria Gonorrhea: NEGATIVE
Trichomonas: POSITIVE — AB

## 2022-06-28 ENCOUNTER — Other Ambulatory Visit: Payer: Self-pay | Admitting: Obstetrics

## 2022-06-28 DIAGNOSIS — A599 Trichomoniasis, unspecified: Secondary | ICD-10-CM

## 2022-06-28 DIAGNOSIS — B9689 Other specified bacterial agents as the cause of diseases classified elsewhere: Secondary | ICD-10-CM

## 2022-06-28 MED ORDER — METRONIDAZOLE 500 MG PO TABS: 500.0000 mg | ORAL_TABLET | Freq: Two times a day (BID) | ORAL | 2 refills | Status: AC

## 2022-07-02 ENCOUNTER — Telehealth: Payer: Self-pay | Admitting: Emergency Medicine

## 2022-07-02 LAB — CYTOLOGY - PAP
Comment: NEGATIVE
Diagnosis: NEGATIVE
High risk HPV: NEGATIVE

## 2022-07-02 NOTE — Telephone Encounter (Signed)
TC to patient to discuss results. Informed of Rx and that partner needs to be treated. Instructed to abstain from sex for 7 days after treatment.

## 2022-08-21 ENCOUNTER — Other Ambulatory Visit: Payer: Self-pay | Admitting: Internal Medicine

## 2022-08-22 LAB — CBC
HCT: 35.1 % (ref 35.0–45.0)
Hemoglobin: 11.4 g/dL — ABNORMAL LOW (ref 11.7–15.5)
MCH: 25.4 pg — ABNORMAL LOW (ref 27.0–33.0)
MCHC: 32.5 g/dL (ref 32.0–36.0)
MCV: 78.3 fL — ABNORMAL LOW (ref 80.0–100.0)
MPV: 10.7 fL (ref 7.5–12.5)
Platelets: 443 10*3/uL — ABNORMAL HIGH (ref 140–400)
RBC: 4.48 10*6/uL (ref 3.80–5.10)
RDW: 14.4 % (ref 11.0–15.0)
WBC: 8.4 10*3/uL (ref 3.8–10.8)

## 2022-08-22 LAB — VITAMIN D 25 HYDROXY (VIT D DEFICIENCY, FRACTURES): Vit D, 25-Hydroxy: 13 ng/mL — ABNORMAL LOW (ref 30–100)

## 2022-08-22 LAB — LIPID PANEL
Cholesterol: 233 mg/dL — ABNORMAL HIGH (ref <200)
HDL: 35 mg/dL — ABNORMAL LOW (ref >=50)
LDL Cholesterol (Calc): 152 mg/dL (calc) — ABNORMAL HIGH
Non-HDL Cholesterol (Calc): 198 mg/dL (calc) — ABNORMAL HIGH (ref <130)
Total CHOL/HDL Ratio: 6.7 (calc) — ABNORMAL HIGH (ref <5.0)
Triglycerides: 301 mg/dL — ABNORMAL HIGH (ref <150)

## 2022-08-22 LAB — COMPLETE METABOLIC PANEL WITH GFR
AG Ratio: 1.1 (calc) (ref 1.0–2.5)
ALT: 19 U/L (ref 6–29)
AST: 15 U/L (ref 10–30)
Albumin: 3.9 g/dL (ref 3.6–5.1)
Alkaline phosphatase (APISO): 89 U/L (ref 31–125)
BUN: 14 mg/dL (ref 7–25)
CO2: 24 mmol/L (ref 20–32)
Calcium: 9.6 mg/dL (ref 8.6–10.2)
Chloride: 102 mmol/L (ref 98–110)
Creat: 0.87 mg/dL (ref 0.50–0.97)
Globulin: 3.6 g/dL (calc) (ref 1.9–3.7)
Glucose, Bld: 175 mg/dL — ABNORMAL HIGH (ref 65–99)
Potassium: 4.3 mmol/L (ref 3.5–5.3)
Sodium: 137 mmol/L (ref 135–146)
Total Bilirubin: 0.2 mg/dL (ref 0.2–1.2)
Total Protein: 7.5 g/dL (ref 6.1–8.1)
eGFR: 87 mL/min/{1.73_m2} (ref >=60)

## 2022-08-22 LAB — TSH: TSH: 1.83 mIU/L

## 2024-01-20 ENCOUNTER — Ambulatory Visit: Payer: Self-pay | Admitting: Family Medicine

## 2024-03-17 ENCOUNTER — Ambulatory Visit: Admitting: Family Medicine

## 2024-07-27 ENCOUNTER — Ambulatory Visit: Payer: Self-pay | Admitting: Obstetrics
